# Patient Record
Sex: Female | Born: 1935 | Race: White | Hispanic: No | Smoking: Never smoker
Health system: Southern US, Community
[De-identification: ages and names within clinical notes are randomized; demographics above are authoritative.]

## PROBLEM LIST (undated history)

## (undated) DIAGNOSIS — F0390 Unspecified dementia without behavioral disturbance: Secondary | ICD-10-CM

## (undated) DIAGNOSIS — M51369 Other intervertebral disc degeneration, lumbar region without mention of lumbar back pain or lower extremity pain: Secondary | ICD-10-CM

## (undated) DIAGNOSIS — M4800 Spinal stenosis, site unspecified: Secondary | ICD-10-CM

## (undated) DIAGNOSIS — S72009A Fracture of unspecified part of neck of unspecified femur, initial encounter for closed fracture: Secondary | ICD-10-CM

## (undated) DIAGNOSIS — M5136 Other intervertebral disc degeneration, lumbar region: Secondary | ICD-10-CM

## (undated) DIAGNOSIS — I10 Essential (primary) hypertension: Secondary | ICD-10-CM

## (undated) DIAGNOSIS — M069 Rheumatoid arthritis, unspecified: Secondary | ICD-10-CM

## (undated) DIAGNOSIS — M549 Dorsalgia, unspecified: Secondary | ICD-10-CM

## (undated) DIAGNOSIS — G8929 Other chronic pain: Secondary | ICD-10-CM

## (undated) HISTORY — PX: OTHER SURGICAL HISTORY: SHX169

## (undated) HISTORY — PX: CATARACT EXTRACTION, BILATERAL: SHX1313

## (undated) HISTORY — PX: HEMORRHOID SURGERY: SHX153

## (undated) HISTORY — PX: CHOLECYSTECTOMY: SHX55

---

## 2005-07-29 ENCOUNTER — Other Ambulatory Visit: Payer: Self-pay

## 2005-07-30 ENCOUNTER — Inpatient Hospital Stay: Payer: Self-pay | Admitting: Internal Medicine

## 2005-08-03 ENCOUNTER — Other Ambulatory Visit: Payer: Self-pay

## 2005-09-04 ENCOUNTER — Inpatient Hospital Stay: Payer: Self-pay | Admitting: Unknown Physician Specialty

## 2005-09-05 ENCOUNTER — Other Ambulatory Visit: Payer: Self-pay

## 2005-09-10 ENCOUNTER — Encounter: Payer: Self-pay | Admitting: Internal Medicine

## 2005-09-11 ENCOUNTER — Encounter: Payer: Self-pay | Admitting: Internal Medicine

## 2005-10-12 ENCOUNTER — Encounter: Payer: Self-pay | Admitting: Internal Medicine

## 2006-12-31 ENCOUNTER — Other Ambulatory Visit: Payer: Self-pay

## 2006-12-31 ENCOUNTER — Ambulatory Visit: Payer: Self-pay | Admitting: Ophthalmology

## 2007-01-07 ENCOUNTER — Ambulatory Visit: Payer: Self-pay | Admitting: Ophthalmology

## 2007-04-28 ENCOUNTER — Ambulatory Visit: Payer: Self-pay | Admitting: Ophthalmology

## 2013-09-25 DIAGNOSIS — M5136 Other intervertebral disc degeneration, lumbar region: Secondary | ICD-10-CM | POA: Insufficient documentation

## 2013-09-25 DIAGNOSIS — M51369 Other intervertebral disc degeneration, lumbar region without mention of lumbar back pain or lower extremity pain: Secondary | ICD-10-CM | POA: Insufficient documentation

## 2013-09-25 DIAGNOSIS — M47816 Spondylosis without myelopathy or radiculopathy, lumbar region: Secondary | ICD-10-CM | POA: Insufficient documentation

## 2013-09-25 DIAGNOSIS — M545 Low back pain, unspecified: Secondary | ICD-10-CM | POA: Insufficient documentation

## 2013-09-25 DIAGNOSIS — M5441 Lumbago with sciatica, right side: Secondary | ICD-10-CM

## 2013-09-25 DIAGNOSIS — M5416 Radiculopathy, lumbar region: Secondary | ICD-10-CM | POA: Insufficient documentation

## 2013-09-25 DIAGNOSIS — M5116 Intervertebral disc disorders with radiculopathy, lumbar region: Secondary | ICD-10-CM | POA: Insufficient documentation

## 2013-09-25 DIAGNOSIS — G8929 Other chronic pain: Secondary | ICD-10-CM | POA: Insufficient documentation

## 2013-12-21 ENCOUNTER — Ambulatory Visit: Payer: Self-pay | Admitting: Orthopedic Surgery

## 2014-01-03 ENCOUNTER — Emergency Department: Payer: Self-pay | Admitting: Emergency Medicine

## 2014-01-03 LAB — URINALYSIS, COMPLETE
BILIRUBIN, UR: NEGATIVE
GLUCOSE, UR: NEGATIVE mg/dL (ref 0–75)
Ketone: NEGATIVE
NITRITE: POSITIVE
PH: 6 (ref 4.5–8.0)
Protein: NEGATIVE
Specific Gravity: 1.009 (ref 1.003–1.030)
Squamous Epithelial: <1
WBC UR: 69 /HPF (ref 0–5)

## 2014-01-03 LAB — BASIC METABOLIC PANEL
ANION GAP: 12 (ref 7–16)
BUN: 25 mg/dL — ABNORMAL HIGH (ref 7–18)
CALCIUM: 8.3 mg/dL — AB (ref 8.5–10.1)
Chloride: 105 mmol/L (ref 98–107)
Co2: 22 mmol/L (ref 21–32)
Creatinine: 1.37 mg/dL — ABNORMAL HIGH (ref 0.60–1.30)
EGFR (Non-African Amer.): 36 — ABNORMAL LOW
GFR CALC AF AMER: 41 — AB
GLUCOSE: 140 mg/dL — AB (ref 65–99)
Osmolality: 284 (ref 275–301)
Potassium: 3.8 mmol/L (ref 3.5–5.1)
Sodium: 139 mmol/L (ref 136–145)

## 2014-01-03 LAB — CBC
HCT: 35.2 % (ref 35.0–47.0)
HGB: 12.1 g/dL (ref 12.0–16.0)
MCH: 31.9 pg (ref 26.0–34.0)
MCHC: 34.4 g/dL (ref 32.0–36.0)
MCV: 93 fL (ref 80–100)
Platelet: 157 10*3/uL (ref 150–440)
RBC: 3.8 10*6/uL (ref 3.80–5.20)
RDW: 12.9 % (ref 11.5–14.5)
WBC: 10 10*3/uL (ref 3.6–11.0)

## 2014-01-05 LAB — URINE CULTURE

## 2015-04-04 ENCOUNTER — Emergency Department: Payer: Medicare Other

## 2015-04-04 ENCOUNTER — Inpatient Hospital Stay
Admission: EM | Admit: 2015-04-04 | Discharge: 2015-04-08 | DRG: 470 | Disposition: A | Discharge status: Skilled Nursing Facility | Payer: Medicare Other | Attending: Internal Medicine | Admitting: Internal Medicine

## 2015-04-04 DIAGNOSIS — Y939 Activity, unspecified: Secondary | ICD-10-CM

## 2015-04-04 DIAGNOSIS — Z803 Family history of malignant neoplasm of breast: Secondary | ICD-10-CM

## 2015-04-04 DIAGNOSIS — M4806 Spinal stenosis, lumbar region: Secondary | ICD-10-CM | POA: Diagnosis present

## 2015-04-04 DIAGNOSIS — D649 Anemia, unspecified: Secondary | ICD-10-CM | POA: Diagnosis present

## 2015-04-04 DIAGNOSIS — I10 Essential (primary) hypertension: Secondary | ICD-10-CM | POA: Diagnosis present

## 2015-04-04 DIAGNOSIS — Y92000 Kitchen of unspecified non-institutional (private) residence as  the place of occurrence of the external cause: Secondary | ICD-10-CM | POA: Diagnosis not present

## 2015-04-04 DIAGNOSIS — S72009A Fracture of unspecified part of neck of unspecified femur, initial encounter for closed fracture: Secondary | ICD-10-CM | POA: Diagnosis present

## 2015-04-04 DIAGNOSIS — M549 Dorsalgia, unspecified: Secondary | ICD-10-CM | POA: Diagnosis present

## 2015-04-04 DIAGNOSIS — K219 Gastro-esophageal reflux disease without esophagitis: Secondary | ICD-10-CM | POA: Diagnosis present

## 2015-04-04 DIAGNOSIS — W010XXA Fall on same level from slipping, tripping and stumbling without subsequent striking against object, initial encounter: Secondary | ICD-10-CM | POA: Diagnosis present

## 2015-04-04 DIAGNOSIS — N390 Urinary tract infection, site not specified: Secondary | ICD-10-CM

## 2015-04-04 DIAGNOSIS — S72002A Fracture of unspecified part of neck of left femur, initial encounter for closed fracture: Principal | ICD-10-CM | POA: Diagnosis present

## 2015-04-04 DIAGNOSIS — K5900 Constipation, unspecified: Secondary | ICD-10-CM | POA: Diagnosis present

## 2015-04-04 DIAGNOSIS — E876 Hypokalemia: Secondary | ICD-10-CM | POA: Diagnosis present

## 2015-04-04 DIAGNOSIS — J9811 Atelectasis: Secondary | ICD-10-CM | POA: Diagnosis present

## 2015-04-04 DIAGNOSIS — G8929 Other chronic pain: Secondary | ICD-10-CM | POA: Diagnosis present

## 2015-04-04 DIAGNOSIS — Z96649 Presence of unspecified artificial hip joint: Secondary | ICD-10-CM

## 2015-04-04 DIAGNOSIS — M069 Rheumatoid arthritis, unspecified: Secondary | ICD-10-CM | POA: Diagnosis present

## 2015-04-04 HISTORY — DX: Rheumatoid arthritis, unspecified: M06.9

## 2015-04-04 HISTORY — DX: Essential (primary) hypertension: I10

## 2015-04-04 HISTORY — DX: Other chronic pain: G89.29

## 2015-04-04 HISTORY — DX: Dorsalgia, unspecified: M54.9

## 2015-04-04 HISTORY — DX: Fracture of unspecified part of neck of unspecified femur, initial encounter for closed fracture: S72.009A

## 2015-04-04 HISTORY — DX: Spinal stenosis, site unspecified: M48.00

## 2015-04-04 LAB — CBC
HCT: 35.4 % (ref 35.0–47.0)
HEMOGLOBIN: 11.5 g/dL — AB (ref 12.0–16.0)
MCH: 28.4 pg (ref 26.0–34.0)
MCHC: 32.5 g/dL (ref 32.0–36.0)
MCV: 87.3 fL (ref 80.0–100.0)
PLATELETS: 209 10*3/uL (ref 150–440)
RBC: 4.06 MIL/uL (ref 3.80–5.20)
RDW: 14.2 % (ref 11.5–14.5)
WBC: 6.7 10*3/uL (ref 3.6–11.0)

## 2015-04-04 LAB — URINALYSIS COMPLETE WITH MICROSCOPIC (ARMC ONLY)
BILIRUBIN URINE: NEGATIVE
Bacteria, UA: NONE SEEN
GLUCOSE, UA: NEGATIVE mg/dL
KETONES UR: NEGATIVE mg/dL
Leukocytes, UA: NEGATIVE
NITRITE: NEGATIVE
Protein, ur: NEGATIVE mg/dL
SPECIFIC GRAVITY, URINE: 1.01 (ref 1.005–1.030)
Squamous Epithelial / LPF: NONE SEEN
pH: 7 (ref 5.0–8.0)

## 2015-04-04 LAB — APTT: APTT: 30 s (ref 24–36)

## 2015-04-04 LAB — COMPREHENSIVE METABOLIC PANEL
ALBUMIN: 3.7 g/dL (ref 3.5–5.0)
ALT: 15 U/L (ref 14–54)
AST: 27 U/L (ref 15–41)
Alkaline Phosphatase: 57 U/L (ref 38–126)
Anion gap: 8 (ref 5–15)
BUN: 17 mg/dL (ref 6–20)
CHLORIDE: 102 mmol/L (ref 101–111)
CO2: 24 mmol/L (ref 22–32)
CREATININE: 0.83 mg/dL (ref 0.44–1.00)
Calcium: 8.9 mg/dL (ref 8.9–10.3)
GFR calc non Af Amer: >60 mL/min (ref >60)
GLUCOSE: 96 mg/dL (ref 65–99)
Potassium: 3.8 mmol/L (ref 3.5–5.1)
SODIUM: 134 mmol/L — AB (ref 135–145)
Total Bilirubin: 0.4 mg/dL (ref 0.3–1.2)
Total Protein: 6.8 g/dL (ref 6.5–8.1)

## 2015-04-04 LAB — TYPE AND SCREEN
ABO/RH(D): O POS
Antibody Screen: NEGATIVE

## 2015-04-04 LAB — MRSA PCR SCREENING: MRSA BY PCR: NEGATIVE

## 2015-04-04 LAB — ABO/RH: ABO/RH(D): O POS

## 2015-04-04 LAB — PROTIME-INR
INR: 0.94
Prothrombin Time: 12.8 seconds (ref 11.4–15.0)

## 2015-04-04 MED ORDER — ONDANSETRON HCL 4 MG/2ML IJ SOLN: 4.0000 mg | Freq: Four times a day (QID) | INTRAMUSCULAR | Status: DC | PRN

## 2015-04-04 MED ORDER — SODIUM CHLORIDE 0.9 % IV SOLN
INTRAVENOUS | Status: DC
  Administered 2015-04-04: 20:00:00 via INTRAVENOUS

## 2015-04-04 MED ORDER — MORPHINE SULFATE (PF) 2 MG/ML IV SOLN
2.0000 mg | INTRAVENOUS | Status: DC | PRN
  Administered 2015-04-04: 2 mg via INTRAVENOUS
  Administered 2015-04-05: 4 mg via INTRAVENOUS
  Administered 2015-04-05: 2 mg via INTRAVENOUS
  Administered 2015-04-05: 4 mg via INTRAVENOUS
  Administered 2015-04-05: 2 mg via INTRAVENOUS
  Filled 2015-04-04 (×3): qty 1
  Filled 2015-04-04 (×2): qty 2

## 2015-04-04 MED ORDER — OXYCODONE HCL 5 MG PO TABS
5.0000 mg | ORAL_TABLET | ORAL | Status: DC | PRN
Start: 2015-04-04 — End: 2015-04-05
  Administered 2015-04-04: 5 mg via ORAL
  Filled 2015-04-04: qty 1

## 2015-04-04 MED ORDER — ONDANSETRON HCL 4 MG PO TABS: 4.0000 mg | ORAL_TABLET | Freq: Four times a day (QID) | ORAL | Status: DC | PRN

## 2015-04-04 MED ORDER — SENNOSIDES-DOCUSATE SODIUM 8.6-50 MG PO TABS
1.0000 | ORAL_TABLET | Freq: Two times a day (BID) | ORAL | Status: DC
  Filled 2015-04-04: qty 1

## 2015-04-04 MED ORDER — DOCUSATE SODIUM 100 MG PO CAPS
100.0000 mg | ORAL_CAPSULE | Freq: Two times a day (BID) | ORAL | Status: DC
  Filled 2015-04-04: qty 1

## 2015-04-04 MED ORDER — HYDROMORPHONE HCL 1 MG/ML IJ SOLN
INTRAMUSCULAR | Status: AC
  Administered 2015-04-04: 1 mg
  Filled 2015-04-04: qty 1

## 2015-04-04 MED ORDER — HYDRALAZINE HCL 20 MG/ML IJ SOLN: 10.0000 mg | Freq: Four times a day (QID) | INTRAMUSCULAR | Status: DC | PRN

## 2015-04-04 MED ORDER — PANTOPRAZOLE SODIUM 40 MG PO TBEC
40.0000 mg | DELAYED_RELEASE_TABLET | Freq: Two times a day (BID) | ORAL | Status: DC
  Administered 2015-04-04: 40 mg via ORAL
  Filled 2015-04-04: qty 1

## 2015-04-04 MED ORDER — BISACODYL 10 MG RE SUPP: 10.0000 mg | Freq: Every day | RECTAL | Status: DC | PRN

## 2015-04-04 MED ORDER — CEFAZOLIN SODIUM-DEXTROSE 2-3 GM-% IV SOLR
2.0000 g | INTRAVENOUS | Status: DC
  Filled 2015-04-04: qty 50

## 2015-04-04 MED ORDER — MORPHINE SULFATE (PF) 2 MG/ML IV SOLN: 2.0000 mg | INTRAVENOUS | Status: DC | PRN

## 2015-04-04 MED ORDER — POLYETHYLENE GLYCOL 3350 17 G PO PACK: 17.0000 g | PACK | Freq: Every day | ORAL | Status: DC | PRN

## 2015-04-04 MED ORDER — OXYCODONE HCL 5 MG PO TABS: 5.0000 mg | ORAL_TABLET | ORAL | Status: DC | PRN

## 2015-04-04 MED ORDER — LISINOPRIL 20 MG PO TABS
20.0000 mg | ORAL_TABLET | Freq: Every day | ORAL | Status: DC
  Administered 2015-04-06 – 2015-04-08 (×3): 20 mg via ORAL
  Filled 2015-04-04 (×3): qty 1

## 2015-04-04 NOTE — H&P (Signed)
[facility] [place] at [facility]   PATIENT NAME: Angel Simmons    MR#:  [id number]  DATE OF BIRTH:  04/05/1929  DATE OF ADMISSION:  04/04/2015  PRIMARY CARE PHYSICIAN: Dr. Corky Downs  REQUESTING/REFERRING PHYSICIAN: Dr. Darnelle Catalan  CHIEF COMPLAINT:   Chief Complaint  Patient presents with  . Fall  . Hip Pain    HISTORY OF PRESENT ILLNESS:  Angel Simmons  is a 81 y.o. female with a known history of hypertension, rheumatoid arthritis and chronic back pain with neurogenic claudication secondary to spinal stenosis presents to the hospital after she had a fall and x-rays showing right femoral neck fracture. Patient denies any cardiac symptoms, no cardiac history. She was walking into her kitchen today and suddenly turned around to lift her phone and had a fall. Denies any lightheadedness, dizziness or chest pain prior or after the fall. Did not lose her consciousness, did not take her head. She twisted her leg and fell onto her left side. Chest x-ray showing only bibasilar atelectasis. EKG and labs are pending at this time. Patient states her pain is only to 2/10 when she is not moving in bed. Does not appear to be in distress.  PAST MEDICAL HISTORY:   Past Medical History  Diagnosis Date  . Hip fx (HCC)   . Hypertension   . Chronic back pain   . Rheumatoid arthritis (HCC)   . Spinal stenosis     PAST SURGICAL HISTORY:   Past Surgical History  Procedure Laterality Date  . Percutaneous pinning of a right femoral neck fracture    . Cholecystectomy    . Hemorrhoid surgery    . Cataract extraction, bilateral      SOCIAL HISTORY:   Social History  Substance Use Topics  . Smoking status: Never Smoker   . Smokeless tobacco: Not on file  . Alcohol Use: No    FAMILY HISTORY:   Family History  Problem Relation Age of Onset  . Breast cancer Mother   . Dementia Father     DRUG ALLERGIES:   Allergies  Allergen Reactions  . Tylenol  [Acetaminophen] Other (See Comments)    Reaction:  Headaches     REVIEW OF SYSTEMS:   Review of Systems  Constitutional: Negative for fever, chills, weight loss and malaise/fatigue.  HENT: Positive for hearing loss. Negative for ear discharge, ear pain, nosebleeds and tinnitus.   Eyes: Negative for double vision and photophobia.  Respiratory: Negative for cough, hemoptysis, shortness of breath and wheezing.   Cardiovascular: Negative for chest pain, palpitations, orthopnea and leg swelling.  Gastrointestinal: Negative for heartburn, nausea, vomiting, abdominal pain, diarrhea, constipation and melena.  Genitourinary: Negative for dysuria, urgency, frequency and hematuria.  Musculoskeletal: Positive for back pain and joint pain. Negative for myalgias and neck pain.       Left hip pain  Skin: Negative for rash.  Neurological: Negative for dizziness, tingling, tremors, sensory change, speech change, focal weakness and headaches.  Endo/Heme/Allergies: Does not bruise/bleed easily.  Psychiatric/Behavioral: Negative for depression.    MEDICATIONS AT HOME:   Prior to Admission medications   Not on File      VITAL SIGNS:  Blood pressure 177/87, pulse 67, temperature 97.5 F (36.4 C), temperature source Oral, resp. rate 12, height 5\' 4"  (1.626 m), weight 53.071 kg (117 lb), SpO2 97 %.  PHYSICAL EXAMINATION:   Physical Exam  GENERAL:  81 y.o.-year-old patient lying in the bed with no acute distress.  EYES: Pupils  equal, round, reactive to light and accommodation. No scleral icterus. Extraocular muscles intact.  HEENT: Head atraumatic, normocephalic. Oropharynx and nasopharynx clear.  NECK:  Supple, no jugular venous distention. No thyroid enlargement, no tenderness.  LUNGS: Normal breath sounds bilaterally, no wheezing, rales,rhonchi or crepitation. No use of accessory muscles of respiration. Decreased bibasilar breath sounds.  CARDIOVASCULAR: S1, S2 normal. No rubs, or gallops. 3/6  systolic murmur in mitral area ABDOMEN: Soft, nontender, nondistended. Bowel sounds present. No organomegaly or mass.  EXTREMITIES: No pedal edema, cyanosis, or clubbing. Left leg is abducted and externally rotated NEUROLOGIC: Cranial nerves II through XII are intact. Muscle strength 5/5 in all extremities except left leg as movement is limited secondary to pain. Sensation intact. Gait not checked.  PSYCHIATRIC: The patient is alert and oriented x 3. Patient is slightly hard of hearing. SKIN: No obvious rash, lesion, or ulcer.   LABORATORY PANEL:   CBC No results for input(s): WBC, HGB, HCT, PLT in the last 168 hours. ------------------------------------------------------------------------------------------------------------------  Chemistries  No results for input(s): NA, K, CL, CO2, GLUCOSE, BUN, CREATININE, CALCIUM, MG, AST, ALT, ALKPHOS, BILITOT in the last 168 hours.  Invalid input(s): GFRCGP ------------------------------------------------------------------------------------------------------------------  Cardiac Enzymes No results for input(s): TROPONINI in the last 168 hours. ------------------------------------------------------------------------------------------------------------------  RADIOLOGY:  Dg Chest 1 View  04/04/2015  CLINICAL DATA:  Fall today.  Left side hip pain, hip fracture. EXAM: CHEST 1 VIEW COMPARISON:  09/05/2005 FINDINGS: Mitral valve annular calcifications. Slight elevation of the left hemidiaphragm. Heart is normal size. Heart is borderline in size. No confluent airspace opacities or effusions. No acute bony abnormality. IMPRESSION: No active disease. Electronically Signed   By: Charlett Nose M.D.   On: 04/04/2015 16:51   Dg Hip Unilat With Pelvis 2-3 Views Left  04/04/2015  CLINICAL DATA:  Fall in kitchen today.  Left hip pain. EXAM: DG HIP (WITH OR WITHOUT PELVIS) 2-3V LEFT COMPARISON:  None. FINDINGS: There is a left femoral neck fracture with varus  angulation. No subluxation or dislocation. 3 screws noted within the right proximal femur. IMPRESSION: Left femoral neck fracture with varus angulation. Electronically Signed   By: Charlett Nose M.D.   On: 04/04/2015 16:48    EKG:   Orders placed or performed in visit on 12/31/06  . EKG 12-Lead    IMPRESSION AND PLAN:   Angel Simmons  is a 81 y.o. female with a known history of hypertension, rheumatoid arthritis and chronic back pain with neurogenic claudication secondary to spinal stenosis presents to the hospital after she had a fall and x-rays showing right femoral neck fracture.  #1 left hip fracture-fall and left femoral neck fracture. -Orthopedics consult. Labs are still pending. Labs 2 months ago were normal. If they turn out to be normal, patient is a low risk for surgery. Can proceed with surgery. -Pain control. Postoperative DVT prophylaxis and physical therapy consults. -Regimen for constipation postoperatively. -social worker consult for possible rehabilitation placement  #2 hypertension-continue lisinopril. -Added hydralazine when necessary. Blood pressure elevated secondary to pain.  #3 rheumatoid arthritis and spinal stenosis-doing physical therapy and also hold therapy. Follows with [facility] surgery.  #4 DVT prophylaxis-none for now due to patient likely to have surgery tomorrow.    All the records are reviewed and case discussed with ED provider. Management plans discussed with the patient, family and they are in agreement.  CODE STATUS: Full Code  TOTAL TIME TAKING CARE OF THIS PATIENT: 50 minutes.    Enid Baas M.D on 04/04/2015 at 5:26  PM  Between 7am to 6pm - Pager - [phone]  After 6pm go to [email or web address] - password [email or web address] [facility]  [place]  Office  [phone]  CC: Primary care physician; No primary care provider on file.

## 2015-04-04 NOTE — ED Notes (Signed)
Pt given boxed meal tray to eat.

## 2015-04-04 NOTE — ED Provider Notes (Signed)
[facility] Emergency Department Provider Note  ____________________________________________  Time seen: Approximately 4:29 PM  I have reviewed the triage vital signs and the nursing notes.   HISTORY  Chief Complaint Fall and Hip Pain    HPI Angel Simmons is a 81 y.o. female who reports she was in her kitchen turned around and lost her balance and fell. She landed on her left hip. She complains of a lot of pain in the left hip. Feels like when she broke her right hip. Pain is worse with movement. Patient did not hit her head and did not lose consciousness. She has no other pain except for her left hip. No numbness or weakness in the leg.    Past Medical History  Diagnosis Date  . Hip fx (HCC)   . Hypertension   . Chronic back pain   . Rheumatoid arthritis (HCC)   . Spinal stenosis     Patient Active Problem List   Diagnosis Date Noted  . Hip fracture (HCC) 04/04/2015    Past Surgical History  Procedure Laterality Date  . Percutaneous pinning of a right femoral neck fracture    . Cholecystectomy    . Hemorrhoid surgery    . Cataract extraction, bilateral      No current outpatient prescriptions on file.  Allergies Tylenol  Family History  Problem Relation Age of Onset  . Breast cancer Mother   . Dementia Father     Social History Social History  Substance Use Topics  . Smoking status: Never Smoker   . Smokeless tobacco: None  . Alcohol Use: No    Review of Systems Constitutional: No fever/chills Eyes: No visual changes. ENT: No sore throat. Cardiovascular: Denies chest pain. Respiratory: Denies shortness of breath. Gastrointestinal: No abdominal pain.  No nausea, no vomiting.  No diarrhea.  No constipation. Genitourinary: Negative for dysuria. Musculoskeletal: Negative for back pain. Skin: Negative for rash. Neurological: Negative for headaches, focal weakness or numbness.  10-point ROS otherwise  negative.  ____________________________________________   PHYSICAL EXAM:  VITAL SIGNS: ED Triage Vitals  Enc Vitals Group     BP 04/04/15 1553 216/100 mmHg     Pulse Rate 04/04/15 1553 69     Resp 04/04/15 1553 18     Temp 04/04/15 1553 97.5 F (36.4 C)     Temp Source 04/04/15 1553 Oral     SpO2 04/04/15 1553 98 %     Weight 04/04/15 1553 117 lb (53.071 kg)     Height 04/04/15 1553 5\' 4"  (1.626 m)     Head Cir --      Peak Flow --      Pain Score 04/04/15 1554 10     Pain Loc --      Pain Edu? --      Excl. in GC? --     Constitutional: Alert and oriented. Well appearing and in no acute distress unless she moves in which case she is in acute distress. Eyes: Conjunctivae are normal. PERRL. EOMI. Head: Atraumatic. Nose: No congestion/rhinnorhea. Mouth/Throat: Mucous membranes are moist.  Oropharynx non-erythematous. Neck: No stridor.No cervical spine tenderness to palpation. Cardiovascular: Normal rate, regular rhythm. Grossly normal heart sounds.  Good peripheral circulation. Respiratory: Normal respiratory effort.  No retractions. Lungs CTAB. Gastrointestinal: Soft and nontender. No distention. No abdominal bruits. No CVA tenderness. Musculoskeletal: No lower extremity tenderness except for in the left hip. There is no edema edema.  No joint effusions. Neurologic:  Normal speech and language.  No gross focal neurologic deficits are appreciated. Specifically pulses capillary refill and sensation normal in the left foot Psychiatric: Mood and affect are normal. Speech and behavior are normal.  ____________________________________________   LABS (all labs ordered are listed, but only abnormal results are displayed)  Labs Reviewed  CBC - Abnormal; Notable for the following:    Hemoglobin 11.5 (*)    All other components within normal limits  COMPREHENSIVE METABOLIC PANEL - Abnormal; Notable for the following:    Sodium 134 (*)    All other components within normal limits   URINALYSIS COMPLETEWITH MICROSCOPIC ([facility] ONLY) - Abnormal; Notable for the following:    Color, Urine YELLOW (*)    APPearance CLEAR (*)    Hgb urine dipstick 1+ (*)    All other components within normal limits  MRSA PCR SCREENING  URINE CULTURE  PROTIME-INR  APTT  VITAMIN D 25 HYDROXY (VIT D DEFICIENCY, FRACTURES)  BASIC METABOLIC PANEL  CBC  TYPE AND SCREEN  ABO/RH   ____________________________________________  EKG  Still pending ____________________________________________  RADIOLOGY  Hip fracture ____________________________________________   PROCEDURES    ____________________________________________   INITIAL IMPRESSION / ASSESSMENT AND PLAN / ED COURSE  Pertinent labs & imaging results that were available during my care of the patient were reviewed by me and considered in my medical decision making (see chart for details).   ____________________________________________   FINAL CLINICAL IMPRESSION(S) / ED DIAGNOSES  Final diagnoses:  Hip fracture, left, closed, initial encounter [facility])  Urinary tract infection without hematuria, site unspecified      Arnaldo Natal, MD 04/04/15 2248

## 2015-04-04 NOTE — ED Notes (Signed)
Patient transported to room 141.

## 2015-04-04 NOTE — ED Notes (Signed)
Pt comes into the ED via EMS from home states she tripped and fell in her kitchen and is having left hip pain.Marland Kitchen

## 2015-04-04 NOTE — Consult Note (Signed)
ORTHOPAEDIC CONSULTATION  PATIENT NAME: Angel Simmons DOB: 1929-03-10  MRN: [id number]  REQUESTING PHYSICIAN: Enid Baas, MD  Chief Complaint: Left hip pain  HPI: Angel Simmons is a 81 y.o. female who complains of  left hip pain. She apparently turned quickly at home and fell, landing on her left hip and side. She was unable to stand or bear weight due to the left hip pain. She denied any loss of consciousness. She denied any other injuries. Prior to the fall she was using ambulatory aids, either a cane or walker.  Of note, the patient is actively being treated for lumbar spinal stenosis with neurogenic claudication. She has received multiple dural steroid injections with reasonably good relief. She is also involved in physical therapy.  Past Medical History  Diagnosis Date  . Hip fx (HCC)   . Hypertension   . Chronic back pain   . Rheumatoid arthritis (HCC)   . Spinal stenosis    Past Surgical History  Procedure Laterality Date  . Percutaneous pinning of a right femoral neck fracture    . Cholecystectomy    . Hemorrhoid surgery    . Cataract extraction, bilateral     Social History   Social History  . Marital Status: Married    Spouse Name: N/A  . Number of Children: N/A  . Years of Education: N/A   Social History Main Topics  . Smoking status: Never Smoker   . Smokeless tobacco: None  . Alcohol Use: No  . Drug Use: No  . Sexual Activity: Not Asked   Other Topics Concern  . None   Social History Narrative   Lives independently at home. Ambulates with the help of a cane, occasionally uses a walker.   Family History  Problem Relation Age of Onset  . Breast cancer Mother   . Dementia Father    Allergies  Allergen Reactions  . Tylenol [Acetaminophen] Other (See Comments)    Reaction:  Headaches    Prior to Admission medications   Not on File   Dg Chest 1 View  04/04/2015  CLINICAL DATA:  Fall today.  Left side hip pain, hip fracture. EXAM: CHEST 1  VIEW COMPARISON:  09/05/2005 FINDINGS: Mitral valve annular calcifications. Slight elevation of the left hemidiaphragm. Heart is normal size. Heart is borderline in size. No confluent airspace opacities or effusions. No acute bony abnormality. IMPRESSION: No active disease. Electronically Signed   By: Charlett Nose M.D.   On: 04/04/2015 16:51   Dg Hip Unilat With Pelvis 2-3 Views Left  04/04/2015  CLINICAL DATA:  Fall in kitchen today.  Left hip pain. EXAM: DG HIP (WITH OR WITHOUT PELVIS) 2-3V LEFT COMPARISON:  None. FINDINGS: There is a left femoral neck fracture with varus angulation. No subluxation or dislocation. 3 screws noted within the right proximal femur. IMPRESSION: Left femoral neck fracture with varus angulation. Electronically Signed   By: Charlett Nose M.D.   On: 04/04/2015 16:48    Positive ROS: All other systems have been reviewed and were otherwise negative with the exception of those mentioned in the HPI and as above.  Physical Exam: General: Alert and alert in no acute distress. HEENT: Atraumatic and normocephalic. Sclera are clear. Extraocular motion is intact. Oropharynx is clear with moist mucosa. Neck: Supple, nontender, good range of motion. No JVD or carotid bruits. Lungs: Clear to auscultation bilaterally. Cardiovascular: Regular rate and rhythm with normal S1 and S2. No murmurs. No gallops or rubs. Pedal pulses are palpable  bilaterally. Homans test is negative bilaterally. No significant pretibial or ankle edema. Abdomen: Soft, nontender, and nondistended. Bowel sounds are present. Skin: No lesions in the area of chief complaint. No gross ecchymosis to the hip or thigh. Neurologic: Awake, alert, and oriented. Sensory function is grossly intact. Motor strength is felt to be 5 over 5 bilaterally. No clonus or tremor. Good motor coordination. Left lower extremity strength was not assessed due to the hip injury. Lymphatic: No axillary or cervical  lymphadenopathy  MUSCULOSKELETAL: Examination of the upper extremities demonstrates good range of motion and stability of the shoulders, elbows, wrists, and digits. There is relative enlargement of the IP joints of both hands consistent with degenerative arthrosis. Examination of the lower extremities is notable for findings involving the left lower extremity. The left lower extremity is shortened and rotated. Pain is elicited with any attempted range of motion of the hip. No knee effusion. No tenderness to palpation about the knee or ankle.  Assessment: Left femoral neck fracture, displaced  Plan: I recommend left hip hemiarthroplasty for the femoral neck fracture. The findings were discussed in detail with the patient. The usual perioperative course was discussed. The risks and benefits of surgical intervention were reviewed. The patient expressed understanding of the risks and benefits and agreed with plans for surgical intervention.  The surgical site was signed as per the "right site surgery" protocol.   Saquoia Sianez P. Angie Fava M.D.

## 2015-04-05 ENCOUNTER — Inpatient Hospital Stay: Payer: Medicare Other | Admitting: Anesthesiology

## 2015-04-05 ENCOUNTER — Encounter
Admission: EM | Disposition: A | Discharge status: Skilled Nursing Facility | Payer: Self-pay | Source: Home / Self Care | Attending: Internal Medicine

## 2015-04-05 ENCOUNTER — Inpatient Hospital Stay: Payer: Medicare Other

## 2015-04-05 ENCOUNTER — Encounter: Payer: Self-pay | Admitting: Anesthesiology

## 2015-04-05 HISTORY — PX: HIP ARTHROPLASTY: SHX981

## 2015-04-05 LAB — BASIC METABOLIC PANEL
Anion gap: 5 (ref 5–15)
BUN: 15 mg/dL (ref 6–20)
CALCIUM: 8.7 mg/dL — AB (ref 8.9–10.3)
CHLORIDE: 104 mmol/L (ref 101–111)
CO2: 26 mmol/L (ref 22–32)
CREATININE: 0.78 mg/dL (ref 0.44–1.00)
GFR calc non Af Amer: >60 mL/min (ref >60)
Glucose, Bld: 101 mg/dL — ABNORMAL HIGH (ref 65–99)
Potassium: 4 mmol/L (ref 3.5–5.1)
SODIUM: 135 mmol/L (ref 135–145)

## 2015-04-05 LAB — CBC
HCT: 33.2 % — ABNORMAL LOW (ref 35.0–47.0)
Hemoglobin: 11 g/dL — ABNORMAL LOW (ref 12.0–16.0)
MCH: 28.9 pg (ref 26.0–34.0)
MCHC: 33.2 g/dL (ref 32.0–36.0)
MCV: 87 fL (ref 80.0–100.0)
PLATELETS: 189 10*3/uL (ref 150–440)
RBC: 3.82 MIL/uL (ref 3.80–5.20)
RDW: 14.4 % (ref 11.5–14.5)
WBC: 7.6 10*3/uL (ref 3.6–11.0)

## 2015-04-05 LAB — VITAMIN D 25 HYDROXY (VIT D DEFICIENCY, FRACTURES): VIT D 25 HYDROXY: 17.4 ng/mL — AB (ref 30.0–100.0)

## 2015-04-05 SURGERY — HEMIARTHROPLASTY, HIP, DIRECT ANTERIOR APPROACH, FOR FRACTURE
Anesthesia: Spinal | Site: Hip | Laterality: Left | Wound class: Clean

## 2015-04-05 MED ORDER — OXYCODONE HCL 5 MG PO TABS
5.0000 mg | ORAL_TABLET | ORAL | Status: DC | PRN
  Administered 2015-04-06 – 2015-04-08 (×7): 5 mg via ORAL
  Filled 2015-04-05 (×7): qty 1

## 2015-04-05 MED ORDER — METOCLOPRAMIDE HCL 10 MG PO TABS
10.0000 mg | ORAL_TABLET | Freq: Three times a day (TID) | ORAL | Status: AC
  Administered 2015-04-06 – 2015-04-07 (×8): 10 mg via ORAL
  Filled 2015-04-05 (×8): qty 1

## 2015-04-05 MED ORDER — PHENYLEPHRINE HCL 10 MG/ML IJ SOLN
INTRAMUSCULAR | Status: DC | PRN
  Administered 2015-04-05: 0.2 mg

## 2015-04-05 MED ORDER — CEFAZOLIN SODIUM-DEXTROSE 2-3 GM-% IV SOLR
2.0000 g | Freq: Four times a day (QID) | INTRAVENOUS | Status: AC
  Administered 2015-04-06 (×4): 2 g via INTRAVENOUS
  Filled 2015-04-05 (×4): qty 50

## 2015-04-05 MED ORDER — FLEET ENEMA 7-19 GM/118ML RE ENEM: 1.0000 | ENEMA | Freq: Once | RECTAL | Status: DC | PRN

## 2015-04-05 MED ORDER — MAGNESIUM HYDROXIDE 400 MG/5ML PO SUSP
30.0000 mL | Freq: Every day | ORAL | Status: DC | PRN
  Administered 2015-04-07 – 2015-04-08 (×2): 30 mL via ORAL
  Filled 2015-04-05 (×2): qty 30

## 2015-04-05 MED ORDER — PANTOPRAZOLE SODIUM 40 MG PO TBEC
40.0000 mg | DELAYED_RELEASE_TABLET | Freq: Two times a day (BID) | ORAL | Status: DC
  Administered 2015-04-06: 40 mg via ORAL
  Filled 2015-04-05: qty 1

## 2015-04-05 MED ORDER — PROPOFOL 500 MG/50ML IV EMUL
INTRAVENOUS | Status: DC | PRN
  Administered 2015-04-05: 21:00:00 via INTRAVENOUS
  Administered 2015-04-05: 25 ug/kg/min via INTRAVENOUS

## 2015-04-05 MED ORDER — LACTATED RINGERS IV SOLN
INTRAVENOUS | Status: DC | PRN
  Administered 2015-04-05 (×2): via INTRAVENOUS

## 2015-04-05 MED ORDER — SODIUM CHLORIDE 0.9 % IV SOLN
INTRAVENOUS | Status: DC
  Administered 2015-04-06: via INTRAVENOUS

## 2015-04-05 MED ORDER — MIDAZOLAM HCL 5 MG/5ML IJ SOLN
INTRAMUSCULAR | Status: DC | PRN
  Administered 2015-04-05 (×2): 0.5 mg via INTRAVENOUS

## 2015-04-05 MED ORDER — ONDANSETRON HCL 4 MG/2ML IJ SOLN
4.0000 mg | Freq: Once | INTRAMUSCULAR | Status: AC | PRN
  Administered 2015-04-05: 4 mg via INTRAVENOUS

## 2015-04-05 MED ORDER — ONDANSETRON HCL 4 MG/2ML IJ SOLN: 4.0000 mg | Freq: Four times a day (QID) | INTRAMUSCULAR | Status: DC | PRN

## 2015-04-05 MED ORDER — METOCLOPRAMIDE HCL 5 MG/ML IJ SOLN: 5.0000 mg | Freq: Three times a day (TID) | INTRAMUSCULAR | Status: DC | PRN

## 2015-04-05 MED ORDER — FENTANYL CITRATE (PF) 100 MCG/2ML IJ SOLN
25.0000 ug | INTRAMUSCULAR | Status: DC | PRN
  Administered 2015-04-05 (×4): 25 ug via INTRAVENOUS

## 2015-04-05 MED ORDER — FERROUS SULFATE 325 (65 FE) MG PO TABS
325.0000 mg | ORAL_TABLET | Freq: Two times a day (BID) | ORAL | Status: DC
  Administered 2015-04-06 – 2015-04-08 (×5): 325 mg via ORAL
  Filled 2015-04-05 (×5): qty 1

## 2015-04-05 MED ORDER — PHENOL 1.4 % MT LIQD
1.0000 | OROMUCOSAL | Status: DC | PRN
  Filled 2015-04-05: qty 177

## 2015-04-05 MED ORDER — TRAMADOL HCL 50 MG PO TABS: 50.0000 mg | ORAL_TABLET | ORAL | Status: DC | PRN

## 2015-04-05 MED ORDER — PHENYLEPHRINE HCL 10 MG/ML IJ SOLN
INTRAMUSCULAR | Status: DC | PRN
  Administered 2015-04-05 (×2): 100 ug via INTRAVENOUS
  Administered 2015-04-05 (×2): 80 ug via INTRAVENOUS

## 2015-04-05 MED ORDER — ENSURE ENLIVE PO LIQD
237.0000 mL | ORAL | Status: DC
  Administered 2015-04-06 – 2015-04-08 (×3): 237 mL via ORAL

## 2015-04-05 MED ORDER — NEOMYCIN-POLYMYXIN B GU 40-200000 IR SOLN
Status: DC | PRN
  Administered 2015-04-05: 16 mL

## 2015-04-05 MED ORDER — KETAMINE HCL 10 MG/ML IJ SOLN
INTRAMUSCULAR | Status: DC | PRN
  Administered 2015-04-05 (×2): 10 mg via INTRAVENOUS

## 2015-04-05 MED ORDER — MENTHOL 3 MG MT LOZG
1.0000 | LOZENGE | OROMUCOSAL | Status: DC | PRN
  Filled 2015-04-05: qty 9

## 2015-04-05 MED ORDER — SENNOSIDES-DOCUSATE SODIUM 8.6-50 MG PO TABS
1.0000 | ORAL_TABLET | Freq: Two times a day (BID) | ORAL | Status: DC
  Administered 2015-04-06 – 2015-04-08 (×5): 1 via ORAL
  Filled 2015-04-05 (×4): qty 1

## 2015-04-05 MED ORDER — MORPHINE SULFATE (PF) 2 MG/ML IV SOLN
2.0000 mg | INTRAVENOUS | Status: DC | PRN
  Administered 2015-04-06: 2 mg via INTRAVENOUS
  Filled 2015-04-05: qty 1

## 2015-04-05 MED ORDER — METOCLOPRAMIDE HCL 5 MG PO TABS: 5.0000 mg | ORAL_TABLET | Freq: Three times a day (TID) | ORAL | Status: DC | PRN

## 2015-04-05 MED ORDER — BISACODYL 10 MG RE SUPP
10.0000 mg | Freq: Every day | RECTAL | Status: DC | PRN
  Administered 2015-04-08: 10 mg via RECTAL
  Filled 2015-04-05: qty 1

## 2015-04-05 MED ORDER — CEFAZOLIN SODIUM-DEXTROSE 2-3 GM-% IV SOLR
INTRAVENOUS | Status: DC | PRN
  Administered 2015-04-05: 2 g via INTRAVENOUS

## 2015-04-05 MED ORDER — ENOXAPARIN SODIUM 30 MG/0.3ML ~~LOC~~ SOLN
30.0000 mg | SUBCUTANEOUS | Status: DC
  Administered 2015-04-06: 30 mg via SUBCUTANEOUS
  Filled 2015-04-05: qty 0.3

## 2015-04-05 MED ORDER — ONDANSETRON HCL 4 MG PO TABS: 4.0000 mg | ORAL_TABLET | Freq: Four times a day (QID) | ORAL | Status: DC | PRN

## 2015-04-05 SURGICAL SUPPLY — 55 items
BAG DECANTER FOR FLEXI CONT (MISCELLANEOUS) ×2 IMPLANT
BLADE SAW 1 (BLADE) ×2 IMPLANT
CANISTER SUCT 1200ML W/VALVE (MISCELLANEOUS) ×2 IMPLANT
CANISTER SUCT 3000ML (MISCELLANEOUS) ×4 IMPLANT
CAPT HIP HEMI 2 ×2 IMPLANT
CATH FOL LEG HOLDER (MISCELLANEOUS) ×2 IMPLANT
CEMENT HV SMART SET (Cement) ×4 IMPLANT
DRAPE INCISE IOBAN 66X60 STRL (DRAPES) ×2 IMPLANT
DRAPE SHEET LG 3/4 BI-LAMINATE (DRAPES) ×2 IMPLANT
DRAPE TABLE BACK 80X90 (DRAPES) ×2 IMPLANT
DRSG DERMACEA 8X12 NADH (GAUZE/BANDAGES/DRESSINGS) ×2 IMPLANT
DRSG OPSITE POSTOP 4X12 (GAUZE/BANDAGES/DRESSINGS) ×2 IMPLANT
DRSG OPSITE POSTOP 4X14 (GAUZE/BANDAGES/DRESSINGS) IMPLANT
DRSG TEGADERM 4X4.75 (GAUZE/BANDAGES/DRESSINGS) ×2 IMPLANT
DURAPREP 26ML APPLICATOR (WOUND CARE) ×4 IMPLANT
ELECT BLADE 6.5 EXT (BLADE) ×2 IMPLANT
ELECT CAUTERY BLADE 6.4 (BLADE) ×2 IMPLANT
GAUZE PACK 2X3YD (MISCELLANEOUS) ×2 IMPLANT
GLOVE BIO SURGEON STRL SZ8 (GLOVE) ×2 IMPLANT
GLOVE BIOGEL M STRL SZ7.5 (GLOVE) ×4 IMPLANT
GLOVE BIOGEL PI IND STRL 9 (GLOVE) IMPLANT
GLOVE BIOGEL PI INDICATOR 9 (GLOVE)
GLOVE INDICATOR 8.0 STRL GRN (GLOVE) ×2 IMPLANT
GOWN STRL REUS W/ TWL LRG LVL3 (GOWN DISPOSABLE) IMPLANT
GOWN STRL REUS W/TWL 2XL LVL3 (GOWN DISPOSABLE) IMPLANT
GOWN STRL REUS W/TWL LRG LVL3 (GOWN DISPOSABLE)
HANDPIECE SUCTION TUBG SURGILV (MISCELLANEOUS) ×2 IMPLANT
HEMOVAC 400CC 10FR (MISCELLANEOUS) ×2 IMPLANT
HOOD PEEL AWAY FLYTE STAYCOOL (MISCELLANEOUS) ×2 IMPLANT
IV NS 100ML SINGLE PACK (IV SOLUTION) ×2 IMPLANT
KIT RM TURNOVER STRD PROC AR (KITS) ×2 IMPLANT
NDL SAFETY 18GX1.5 (NEEDLE) ×2 IMPLANT
NEEDLE FILTER BLUNT 18X 1/2SAF (NEEDLE) ×1
NEEDLE FILTER BLUNT 18X1 1/2 (NEEDLE) ×1 IMPLANT
NS IRRIG 1000ML POUR BTL (IV SOLUTION) ×2 IMPLANT
PACK HIP PROSTHESIS (MISCELLANEOUS) ×2 IMPLANT
PAD GROUND ADULT SPLIT (MISCELLANEOUS) ×2 IMPLANT
PRESSURIZER CEMENT PROX FEM SM (MISCELLANEOUS) IMPLANT
PRESSURIZER FEM CANAL M (MISCELLANEOUS) ×2 IMPLANT
SOL .9 NS 3000ML IRR  AL (IV SOLUTION) ×1
SOL .9 NS 3000ML IRR UROMATIC (IV SOLUTION) ×1 IMPLANT
SOL PREP PVP 2OZ (MISCELLANEOUS) ×2
SOLUTION PREP PVP 2OZ (MISCELLANEOUS) ×1 IMPLANT
SPONGE DRAIN TRACH 4X4 STRL 2S (GAUZE/BANDAGES/DRESSINGS) ×2 IMPLANT
STAPLER SKIN PROX 35W (STAPLE) ×2 IMPLANT
SUT ETHIBOND #5 BRAIDED 30INL (SUTURE) ×2 IMPLANT
SUT VIC AB 0 CT1 36 (SUTURE) ×2 IMPLANT
SUT VIC AB 1 CT1 36 (SUTURE) ×8 IMPLANT
SUT VIC AB 2-0 CT1 27 (SUTURE) ×1
SUT VIC AB 2-0 CT1 TAPERPNT 27 (SUTURE) ×1 IMPLANT
SYR 20CC LL (SYRINGE) ×2 IMPLANT
SYR TB 1ML LUER SLIP (SYRINGE) ×2 IMPLANT
TAPE TRANSPORE STRL 2 31045 (GAUZE/BANDAGES/DRESSINGS) ×2 IMPLANT
TIP COAXIAL FEMORAL CANAL (MISCELLANEOUS) ×2 IMPLANT
TOWER CARTRIDGE SMART MIX (DISPOSABLE) ×2 IMPLANT

## 2015-04-05 NOTE — Brief Op Note (Signed)
04/04/2015 - 04/05/2015  9:57 PM  PATIENT:  Angel Simmons  81 y.o. female  PRE-OPERATIVE DIAGNOSIS:  Left femoral neck fracture  POST-OPERATIVE DIAGNOSIS:  Left femoral neck fracture  PROCEDURE: Left hip hemiarthroplasty  SURGEON:  Surgeon(s) and Role:    * Donato Heinz, MD - Primary  ASSISTANTS: none   ANESTHESIA:   spinal  EBL:  Total I/O In: 1200 [I.V.:1200] Out: 275 [Urine:200; Blood:75]  BLOOD ADMINISTERED:none  DRAINS: 2 medium drains to a Hemovac reservoir   LOCAL MEDICATIONS USED:  NONE  SPECIMEN:  Source of Specimen:  Left femoral head  DISPOSITION OF SPECIMEN:  PATHOLOGY  COUNTS:  YES  TOURNIQUET:  * No tourniquets in log *  DICTATION: .Dragon Dictation  PLAN OF CARE: Admit to inpatient   PATIENT DISPOSITION:  PACU - hemodynamically stable.   Delay start of Pharmacological VTE agent (>24hrs) due to surgical blood loss or risk of bleeding: yes

## 2015-04-05 NOTE — Anesthesia Preprocedure Evaluation (Addendum)
Anesthesia Evaluation  Patient identified by MRN, date of birth, ID band Patient awake    Reviewed: Allergy & Precautions, NPO status , Patient's Chart, lab work & pertinent test results  Airway Mallampati: II  TM Distance: >3 FB     Dental  (+) Chipped   Pulmonary neg pulmonary ROS,    Pulmonary exam normal breath sounds clear to auscultation       Cardiovascular hypertension, Pt. on medications Normal cardiovascular exam     Neuro/Psych negative neurological ROS  negative psych ROS   GI/Hepatic negative GI ROS, Neg liver ROS,   Endo/Other  negative endocrine ROS  Renal/GU negative Renal ROS  negative genitourinary   Musculoskeletal  (+) Arthritis , Rheumatoid disorders,  Chronic back pain   Abdominal Normal abdominal exam  (+)   Peds negative pediatric ROS (+)  Hematology negative hematology ROS (+)   Anesthesia Other Findings   Reproductive/Obstetrics                           Anesthesia Physical Anesthesia Plan  ASA: III and emergent  Anesthesia Plan: Spinal   Post-op Pain Management:    Induction: Intravenous  Airway Management Planned: Nasal Cannula  Additional Equipment:   Intra-op Plan:   Post-operative Plan:   Informed Consent: I have reviewed the patients History and Physical, chart, labs and discussed the procedure including the risks, benefits and alternatives for the proposed anesthesia with the patient or authorized representative who has indicated his/her understanding and acceptance.   Dental advisory given  Plan Discussed with: CRNA and Surgeon  Anesthesia Plan Comments:        Anesthesia Quick Evaluation

## 2015-04-05 NOTE — Progress Notes (Signed)
PT Cancellation Note  Patient Details Name: Angel Simmons MRN: [id number] DOB: 02-20-1929   Cancelled Treatment:    Reason Eval/Treat Not Completed: Medical issues which prohibited therapy. PT reviewed chart and noted patient will be undergoing surgery today. PT will defer mobility evaluation until after orthopedic surgery.    Kerin Ransom, PT, DPT    04/05/2015, 8:25 AM

## 2015-04-05 NOTE — Progress Notes (Signed)
[facility] [place] at [facility]   PATIENT NAME: Angel Simmons    MR#:  [id number]  DATE OF BIRTH:  1928-10-23  SUBJECTIVE:  CHIEF COMPLAINT:   Chief Complaint  Patient presents with  . Fall  . Hip Pain   Patient here with fall and noted to have a left hip fracture. Going to Surgery later today.  REVIEW OF SYSTEMS:    Review of Systems  Constitutional: Negative for fever and chills.  HENT: Negative for congestion and tinnitus.   Eyes: Negative for blurred vision and double vision.  Respiratory: Negative for cough, shortness of breath and wheezing.   Cardiovascular: Negative for chest pain, orthopnea and PND.  Gastrointestinal: Negative for nausea, vomiting, abdominal pain and diarrhea.  Genitourinary: Negative for dysuria and hematuria.  Musculoskeletal: Positive for joint pain (left hip).  Neurological: Negative for dizziness, sensory change and focal weakness.  All other systems reviewed and are negative.   Nutrition: Currently NPO due to surgery later today Tolerating Diet: No Tolerating PT: Await Eval post-op   DRUG ALLERGIES:   Allergies  Allergen Reactions  . Tylenol [Acetaminophen] Other (See Comments)    Reaction:  Headaches     VITALS:  Blood pressure 118/49, pulse 64, temperature 97.8 F (36.6 C), temperature source Oral, resp. rate 16, height 5\' 4"  (1.626 m), weight 60.147 kg (132 lb 9.6 oz), SpO2 98 %.  PHYSICAL EXAMINATION:   Physical Exam  GENERAL:  81 y.o.-year-old patient lying in the bed with no acute distress.  EYES: Pupils equal, round, reactive to light and accommodation. No scleral icterus. Extraocular muscles intact.  HEENT: Head atraumatic, normocephalic. Oropharynx and nasopharynx clear.  NECK:  Supple, no jugular venous distention. No thyroid enlargement, no tenderness.  LUNGS: Normal breath sounds bilaterally, no wheezing, rales, rhonchi. No use of accessory muscles of respiration.  CARDIOVASCULAR: S1, S2  normal. No murmurs, rubs, or gallops.  ABDOMEN: Soft, nontender, nondistended. Bowel sounds present. No organomegaly or mass.  EXTREMITIES: No cyanosis, clubbing or edema b/l.  Left lower extremity shortened and externally rotated due to the hip fracture. Left lower extremity is in traction  NEUROLOGIC: Cranial nerves II through XII are intact. No focal Motor or sensory deficits b/l.   PSYCHIATRIC: The patient is alert and oriented x 3. Good affect.  SKIN: No obvious rash, lesion, or ulcer.    LABORATORY PANEL:   CBC  Recent Labs Lab 04/05/15 0427  WBC 7.6  HGB 11.0*  HCT 33.2*  PLT 189   ------------------------------------------------------------------------------------------------------------------  Chemistries   Recent Labs Lab 04/04/15 1558 04/05/15 0427  NA 134* 135  K 3.8 4.0  CL 102 104  CO2 24 26  GLUCOSE 96 101*  BUN 17 15  CREATININE 0.83 0.78  CALCIUM 8.9 8.7*  AST 27  --   ALT 15  --   ALKPHOS 57  --   BILITOT 0.4  --    ------------------------------------------------------------------------------------------------------------------  Cardiac Enzymes No results for input(s): TROPONINI in the last 168 hours. ------------------------------------------------------------------------------------------------------------------  RADIOLOGY:  Dg Chest 1 View  04/04/2015  CLINICAL DATA:  Fall today.  Left side hip pain, hip fracture. EXAM: CHEST 1 VIEW COMPARISON:  09/05/2005 FINDINGS: Mitral valve annular calcifications. Slight elevation of the left hemidiaphragm. Heart is normal size. Heart is borderline in size. No confluent airspace opacities or effusions. No acute bony abnormality. IMPRESSION: No active disease. Electronically Signed   By: 09/07/2005 M.D.   On: 04/04/2015 16:51   Dg Hip Unilat With  Pelvis 2-3 Views Left  04/04/2015  CLINICAL DATA:  Fall in kitchen today.  Left hip pain. EXAM: DG HIP (WITH OR WITHOUT PELVIS) 2-3V LEFT COMPARISON:  None.  FINDINGS: There is a left femoral neck fracture with varus angulation. No subluxation or dislocation. 3 screws noted within the right proximal femur. IMPRESSION: Left femoral neck fracture with varus angulation. Electronically Signed   By: Charlett Nose M.D.   On: 04/04/2015 16:48     ASSESSMENT AND PLAN:   81 year old female with past medical history of hypertension, rheumatoid arthritis, chronic back pain, spinal stenosis who presented to the hospital after a mechanical fall and noted to have a left hip fracture.  #1 left hip fracture-status post due to mechanical fall. -Patient is low risk for noncardiac surgery and is going to the OR later today -Appreciate orthopedics input and continue care as per them  #2 hypertension-continue lisinopril. - PRN hydralazine.   #3 rheumatoid arthritis and spinal stenosis- resume PT post-operatively.  Clinically no acute issue.   #4 GERD - cont. Protonix.     All the records are reviewed and case discussed with Care Management/Social Workerr. Management plans discussed with the patient, family and they are in agreement.  CODE STATUS: Full  DVT Prophylaxis: Likely Lovenox postoperatively  TOTAL TIME TAKING CARE OF THIS PATIENT: 25 minutes.   POSSIBLE D/C IN 2-3 DAYS, DEPENDING ON CLINICAL CONDITION.   Houston Siren M.D on 04/05/2015 at 1:53 PM  Between 7am to 6pm - Pager - [phone]  After 6pm go to [email or web address] - password [email or web address] [facility]  [facility]  Office  [phone]  CC: Primary care physician; No primary care provider on file.

## 2015-04-05 NOTE — Progress Notes (Signed)
Initial Nutrition Assessment   INTERVENTION:   Meals and Snacks: Cater to patient preferences once diet order advanced. RD notes SLP evaluation pending. Medical Food Supplement Therapy: will recommend Ensure Enlive po BID, each supplement provides 350 kcal and 20 grams of protein,m once diet order able to be advanced for added nutrition to promote healing  NUTRITION DIAGNOSIS:   Inadequate oral intake related to inability to eat as evidenced by NPO status.  GOAL:   Patient will meet greater than or equal to 90% of their needs  MONITOR:    (Energy Intake, Anthropometrics, Digestive System)  REASON FOR ASSESSMENT:   Consult Hip fracture protocol  ASSESSMENT:   Pt admitted with left hip fracture s/p fall scheduled for left hip arthroplasty 04/05/2015.   Past Medical History  Diagnosis Date  . Hip fx (HCC)   . Hypertension   . Chronic back pain   . Rheumatoid arthritis (HCC)   . Spinal stenosis     Diet Order:  Diet NPO time specified    Current Nutrition: Pt currently NPO for procedure.   Food/Nutrition-Related History: Pt reports eating 3 small meals per day PTA. Pt reports really liking vegetables the best and would favor eating vegetable plates over large meat meals such as lasagna. Pt reports family brings meals often. Pt reports not drinking supplements PTA.   Scheduled Medications:  .  ceFAZolin (ANCEF) IV  2 g Intravenous To OR  . docusate sodium  100 mg Oral BID  . lisinopril  20 mg Oral Daily  . pantoprazole  40 mg Oral BID  . senna-docusate  1 tablet Oral BID    Continuous Medications:  . sodium chloride 75 mL/hr at 04/04/15 2000     Electrolyte/Renal Profile and Glucose Profile:   Recent Labs Lab 04/04/15 1558 04/05/15 0427  NA 134* 135  K 3.8 4.0  CL 102 104  CO2 24 26  BUN 17 15  CREATININE 0.83 0.78  CALCIUM 8.9 8.7*  GLUCOSE 96 101*   Protein Profile:  Recent Labs Lab 04/04/15 1558  ALBUMIN 3.7    Gastrointestinal  Profile: Last BM:  04/03/2015   Nutrition-Focused Physical Exam Findings:  Unable to complete Nutrition-Focused physical exam at this time.    Weight Change: Pt reports weight loss according to Dr. Renie Ora office recently which scale there reported weight of 111lbs, however pt reports on admission weight was UBW of 117lbs. Pt reports weight of 150s before husband died (unable to clarify how long ago)   Height:   Ht Readings from Last 1 Encounters:  04/04/15 5\' 4"  (1.626 m)    Weight:   Wt Readings from Last 1 Encounters:  04/04/15 132 lb 9.6 oz (60.147 kg)     BMI:  Body mass index is 22.75 kg/(m^2).  Estimated Nutritional Needs:   Kcal:  BEE: 1035kcals, TEE: (IF 1.1-1.3)(AF 1.2) 1366-1642kcals  Protein:  66-78g protein (1.1-1.3g/kg)  Fluid:  1500-1823mL of fluid (25-82mL/kg)  EDUCATION NEEDS:   No education needs identified at this time   MODERATE Care Level  31m, RD, LDN Pager [phone]

## 2015-04-05 NOTE — Transfer of Care (Signed)
Immediate Anesthesia Transfer of Care Note  Patient: Angel Simmons  Procedure(s) Performed: Procedure(s): ARTHROPLASTY BIPOLAR HIP (HEMIARTHROPLASTY) (Left)  Patient Location: PACU  Anesthesia Type:Spinal  Level of Consciousness: sedated  Airway & Oxygen Therapy: Patient Spontanous Breathing and Patient connected to face mask oxygen  Post-op Assessment: Report given to RN and Post -op Vital signs reviewed and stable  Post vital signs: Reviewed and stable  Last Vitals:  Filed Vitals:   04/05/15 1527 04/05/15 2155  BP: 130/55   Pulse: 73 60  Temp: 36.8 C   Resp: 16 22    Complications: No apparent anesthesia complications

## 2015-04-05 NOTE — Care Management Note (Signed)
Case Management Note  Patient Details  Name: Angel Simmons MRN: [id number] Date of Birth: [birth year]  Subjective/Objective:                  Met with patient and her daughter to discuss discharge planning. Prior to surgery today. She would like to go to [facility] at discharge. PT pending post surgery. She is from home alone where she was being seen at [facility] outpatient PT and recently stopped using her walker (Rollator). She uses [facility] for Rx : [phone]. She was independent at home using an exercise bicycle and driving short distances- grocery store. She also has [employer] Alert system.  Action/Plan: List of home health providers left with patient. RNCM will continue to follow.   Expected Discharge Date:                  Expected Discharge Plan:     In-House Referral:  Clinical Social Work  Discharge planning Services  CM Consult  Post Acute Care Choice:    Choice offered to:  Patient, Sibling, Adult Children  DME Arranged:    DME Agency:     HH Arranged:    [place] Agency:     Status of Service:  In process, will continue to follow  Medicare Important Message Given:    Date Medicare IM Given:    Medicare IM give by:    Date Additional Medicare IM Given:    Additional Medicare Important Message give by:     If discussed at [place] of Stay Meetings, dates discussed:    Additional Comments:  Marshell Garfinkel, RN 04/05/2015, 11:05 AM

## 2015-04-05 NOTE — Anesthesia Procedure Notes (Addendum)
Spinal Patient location during procedure: OR  Spinal  Start time: 04/05/2015 6:45 PM End time: 04/05/2015 6:50 PM Staffing Anesthesiologist: Yves Dill Performed by: anesthesiologist  Preanesthetic Checklist Completed: patient identified, site marked, surgical consent, pre-op evaluation, timeout performed, IV checked, risks and benefits discussed and monitors and equipment checked Spinal Block Patient position: sitting Prep: Betadine and site prepped and draped Patient monitoring: heart rate, cardiac monitor, continuous pulse ox and blood pressure Approach: midline Location: L3-4 Injection technique: single-shot Needle Needle type: Quincke  Needle gauge: 25 G Assessment Sensory level: T8 Additional Notes Time out called.  Patient lightly sedated and placed in sitting position.  Prepped and draped in sterile fashion.  A skin wheal was made with 1% Lidocaine plain.  A 25G Quinke needle was advanced until clear, colorless CSF in all 4 quadrants was obtained.  No paresthesias and no blood.  Injection of 10mg  of spinal marcaine and 3mg  of tetracaine with 1:200K epi.  Patient tolerated the procedure well

## 2015-04-05 NOTE — Op Note (Signed)
OPERATIVE NOTE  DATE OF SURGERY:  04/04/2015 - 04/05/2015  PATIENT NAME:  Angel Simmons   DOB: 1929-02-22  MRN: [id number]  PRE-OPERATIVE DIAGNOSIS: Left femoral neck fracture  POST-OPERATIVE DIAGNOSIS:  Same  PROCEDURE:  Left hip hemiarthroplasty  SURGEON:  Jena Gauss. M.D.  ANESTHESIA: spinal  ESTIMATED BLOOD LOSS: 75 mL  FLUIDS REPLACED: 1200 mL of crystalloid  DRAINS: 2 medium drains to a Hemovac reservoir  IMPLANTS UTILIZED: DePuy size 3 Summit femoral stem (cemented), 11 mm Cementralizer, 47 mm OD Cathcart hip ball, +0 mm tapered spacer, and a size 3 femoral cement restrictor  INDICATIONS FOR SURGERY: Angel Simmons is a 81 y.o. year old female who fell and sustained a displaced left femoral neck fracture. After discussion of the risks and benefits of surgical intervention, the patient expressed understanding of the risks benefits and agree with plans for hip hemiarthroplasty.   The risks, benefits, and alternatives were discussed at length including but not limited to the risks of infection, bleeding, nerve injury, stiffness, blood clots, the need for revision surgery, limb length inequality, dislocation, cardiopulmonary complications, among others, and they were willing to proceed.  PROCEDURE IN DETAIL: The patient was brought into the operating room and, after adequate spinal anesthesia was achieved, patient was placed in a right lateral decubitus position. Axillary roll was placed and all bony prominences were well-padded. The patient's left hip was cleaned and prepped with alcohol and DuraPrep and draped in the usual sterile fashion. A "timeout" was performed as per usual protocol. A lateral curvilinear incision was made gently curving towards the posterior superior iliac spine. The IT band was incised in line with the skin incision and the fibers of the gluteus maximus were split in line. The piriformis tendon was identified, skeletonized, and incised at its insertion  to the proximal femur and reflected posteriorly. A T type posterior capsulotomy was performed. The femoral head was then removed using a corkscrew device. The femoral head was measured using calipers and ring gauges and determined to be 47 mm in diameter.The femoral neck cut was performed using an oscillating saw. The acetabulum was inspected for any bony fragments. The articular surface was in good condition.  Attention was then directed to the proximal femur. A pilot hole for preparation of the proximal femoral canal was created using a high-speed bur. The femoral canal finder was inserted followed by insertion of the conical reamer. Serial broaches were inserted up to a size 3 broach. Calcar region was planed and a trial reduction was performed using a 47 mm OD Cathcart ball with a +0 mm neck length. Good equalization of limb lengths was appreciated and excellent stability was noted both anteriorly and posteriorly. Trial components were removed. The femoral canal was sized and was felt that a size 3 cement restrictor was appropriate. The cement restrictor was inserted to the appropriate depth in the femoral canal was irrigated with copious amounts of fluid using the pulse lavage and suctioned dry. The femoral canal was then packed with vaginal packing soaked in dilute Neo-Synephrine. Polymethylmethacrylate cement with gentamicin was per usual fashion using a vacuum mixer. Vaginal packing was removed and the canal again irrigated and suctioned dry. The polymethylmethacrylate cement was inserted in retrograde fashion and pressurized. The size 3 Summit femoral component with a 11 mm Cementralizer was positioned and impacted into place. Excess cement was removed using Personal assistant. After adequate curing of the cement, the Morse taper was cleaned and dried. A 47 mm outer  diameter Cathcart hip ball with a +0 mm tapered spacer was placed on the trunnion and impacted into place. The acetabulum was again irrigated  and suctioned dry, making sure to inspect for any residal bony debris. The femoral head was then reduced and placed through a range of motion. Excellent stability was noted both anteriorly and posteriorly. Good equalization of limb lengths was appreciated.   The wound was irrigated with copious amounts of normal saline with antibiotic solution and suctioned dry. Good hemostasis was appreciated. The posterior capsulotomy was repaired using #5 Ethibond. Piriformis tendon was reapproximated to the undersurface of the gluteus medius tendon using #5 Ethibond. Two medium drains were placed in the wound bed and brought out through separate stab incisions to be attached to a Hemovac reservoir. The IT band was reapproximated using interrupted sutures of #1 Vicryl. Subcutaneous tissue was proximal phalanx using first #0 Vicryl followed by #2-0 Vicryl. The skin was closed with skin staples.  The patient tolerated the procedure well and was transported to the recovery room in stable condition.   Jena Gauss., M.D.

## 2015-04-05 NOTE — Progress Notes (Signed)
Speech Therapy Note: received order, reviewed chart notes and consulted NSG/Dtrs present. Pt is NPO and due for surgery this PM sec. to fall at home and hip fx. Dtrs present in room deny any known swallowing problems at home. ST will f/u tomorrow once pt is able to take po's post surgery. NSG updated.

## 2015-04-06 ENCOUNTER — Encounter: Payer: Self-pay | Admitting: Orthopedic Surgery

## 2015-04-06 LAB — BASIC METABOLIC PANEL
ANION GAP: 6 (ref 5–15)
BUN: 17 mg/dL (ref 6–20)
CALCIUM: 7.9 mg/dL — AB (ref 8.9–10.3)
CO2: 25 mmol/L (ref 22–32)
Chloride: 106 mmol/L (ref 101–111)
Creatinine, Ser: 0.74 mg/dL (ref 0.44–1.00)
GLUCOSE: 125 mg/dL — AB (ref 65–99)
Potassium: 4 mmol/L (ref 3.5–5.1)
SODIUM: 137 mmol/L (ref 135–145)

## 2015-04-06 LAB — CBC
HCT: 29.2 % — ABNORMAL LOW (ref 35.0–47.0)
Hemoglobin: 9.6 g/dL — ABNORMAL LOW (ref 12.0–16.0)
MCH: 28.5 pg (ref 26.0–34.0)
MCHC: 32.8 g/dL (ref 32.0–36.0)
MCV: 86.9 fL (ref 80.0–100.0)
PLATELETS: 165 10*3/uL (ref 150–440)
RBC: 3.36 MIL/uL — AB (ref 3.80–5.20)
RDW: 14.2 % (ref 11.5–14.5)
WBC: 8.1 10*3/uL (ref 3.6–11.0)

## 2015-04-06 LAB — URINE CULTURE
CULTURE: NO GROWTH
SPECIAL REQUESTS: NORMAL

## 2015-04-06 MED ORDER — ENOXAPARIN SODIUM 30 MG/0.3ML ~~LOC~~ SOLN
30.0000 mg | Freq: Two times a day (BID) | SUBCUTANEOUS | Status: DC
  Administered 2015-04-06 – 2015-04-08 (×4): 30 mg via SUBCUTANEOUS
  Filled 2015-04-06 (×4): qty 0.3

## 2015-04-06 NOTE — Progress Notes (Signed)
Speech Therapy Note: received order, reviewed chart notes and consulted NSG re: pt's status. Family had denied any swallowing issues yesterday. Pt has now had her hip surgery and is alert/awake sitting in a chair eating her lunch meal w/ visitors. Pt denied any difficulty swallowing; no c/o coughing or throat clearing w/ po's. Pt conversed at conversational level w/out deficits. Pt is on a regular diet and taking meds w/ water w/ NSG; NSG denied any trouble swallowing w/ meds as well.  Due to pt appearing at her baseline w/ swallowing and no reports of dysphagia, ST will sign off at this time w/ NSG to reconsult if nec. while admitted. Pt/family agreed.

## 2015-04-06 NOTE — NC FL2 (Signed)
MEDICAID FL2 LEVEL OF CARE SCREENING TOOL     IDENTIFICATION  Patient Name: Angel Simmons Birthdate: Feb 05, 1929 Sex: female Admission Date (Current Location): 04/04/2015  [place] and [place] Number:  [facility] )   Facility and Address:  [facility], [address] [place], [place] [address]      Provider Number: [id number]  Attending Physician Name and Address:  Alford Highland, MD  Relative Name and Phone Number:       Current Level of Care: Hospital Recommended Level of Care: Skilled Nursing Facility Prior Approval Number:    Date Approved/Denied:   PASRR Number:  [id number])  Discharge Plan: SNF    Current Diagnoses: Patient Active Problem List   Diagnosis Date Noted  . Hip fracture (HCC) 04/04/2015    Orientation ACTIVITIES/SOCIAL BLADDER RESPIRATION    Self, Time, Situation, Place  Active Continent Normal  BEHAVIORAL SYMPTOMS/MOOD NEUROLOGICAL BOWEL NUTRITION STATUS   (none )  (none ) Continent Diet (Regular Diet )  PHYSICIAN VISITS COMMUNICATION OF NEEDS Height & Weight Skin  30 days Verbally 5\' 4"  (162.6 cm) 132 lbs. Surgical wounds (Incision: Left Hip. )          AMBULATORY STATUS RESPIRATION    Assist extensive Normal      Personal Care Assistance Level of Assistance  Bathing, Feeding, Dressing Bathing Assistance: Limited assistance Feeding assistance: Independent Dressing Assistance: Limited assistance      Functional Limitations Info  Sight, Hearing, Speech Sight Info: Adequate Hearing Info: Impaired Speech Info: Adequate       SPECIAL CARE FACTORS FREQUENCY  PT (By licensed PT), OT (By licensed OT)     PT Frequency:  (5) OT Frequency:  (5)           Additional Factors Info  Code Status, Allergies Code Status Info:  (Full Code. ) Allergies Info:  (Tylenol Acetaminophen)           Current Medications (04/06/2015): Current Facility-Administered Medications  Medication Dose  Route Frequency Provider Last Rate Last Dose  . bisacodyl (DULCOLAX) suppository 10 mg  10 mg Rectal Daily PRN 04/08/2015, MD      . enoxaparin (LOVENOX) injection 30 mg  30 mg Subcutaneous Q12H Donato Heinz, MD      . feeding supplement (ENSURE ENLIVE) (ENSURE ENLIVE) liquid 237 mL  237 mL Oral Q24H Donato Heinz, MD   237 mL at 04/06/15 1000  . ferrous sulfate tablet 325 mg  325 mg Oral BID WC 04/08/15, MD   325 mg at 04/06/15 1648  . hydrALAZINE (APRESOLINE) injection 10 mg  10 mg Intravenous Q6H PRN 04/08/15, MD      . lisinopril (PRINIVIL,ZESTRIL) tablet 20 mg  20 mg Oral Daily Enid Baas, MD   20 mg at 04/06/15 0754  . magnesium hydroxide (MILK OF MAGNESIA) suspension 30 mL  30 mL Oral Daily PRN 04/08/15, MD      . menthol-cetylpyridinium (CEPACOL) lozenge 3 mg  1 lozenge Oral PRN Donato Heinz, MD       Or  . phenol (CHLORASEPTIC) mouth spray 1 spray  1 spray Mouth/Throat PRN Donato Heinz, MD      . metoCLOPramide (REGLAN) tablet 5-10 mg  5-10 mg Oral Q8H PRN Donato Heinz, MD       Or  . metoCLOPramide (REGLAN) injection 5-10 mg  5-10 mg Intravenous Q8H PRN 7-10, MD      . metoCLOPramide (  REGLAN) tablet 10 mg  10 mg Oral TID AC & HS Donato Heinz, MD   10 mg at 04/06/15 1648  . morphine 2 MG/ML injection 2-4 mg  2-4 mg Intravenous Q4H PRN Donato Heinz, MD   2 mg at 04/06/15 0303  . ondansetron (ZOFRAN) tablet 4 mg  4 mg Oral Q6H PRN Donato Heinz, MD       Or  . ondansetron (ZOFRAN) injection 4 mg  4 mg Intravenous Q6H PRN Donato Heinz, MD      . oxyCODONE (Oxy IR/ROXICODONE) immediate release tablet 5-10 mg  5-10 mg Oral Q4H PRN Donato Heinz, MD   5 mg at 04/06/15 0606  . senna-docusate (Senokot-S) tablet 1 tablet  1 tablet Oral BID Donato Heinz, MD   1 tablet at 04/06/15 0755  . sodium phosphate (FLEET) 7-19 GM/118ML enema 1 enema  1 enema Rectal Once PRN Donato Heinz, MD      . traMADol Janean Sark) tablet 50-100 mg  50-100 mg  Oral Q4H PRN Donato Heinz, MD       Do not use this list as official medication orders. Please verify with discharge summary.  Discharge Medications:   Medication List    ASK your doctor about these medications        lisinopril 20 MG tablet  Commonly known as:  PRINIVIL,ZESTRIL  Take 20 mg by mouth daily.     naproxen sodium 220 MG tablet  Commonly known as:  ANAPROX  Take 220-440 mg by mouth 2 (two) times daily as needed (for pain).     oxyCODONE 5 MG immediate release tablet  Commonly known as:  Oxy IR/ROXICODONE  Take 2.5 mg by mouth 3 (three) times daily as needed for severe pain.        Relevant Imaging Results:  Relevant Lab Results:  Recent Labs    Additional Information  (SSN: [id number])  Haig Prophet, LCSW

## 2015-04-06 NOTE — Progress Notes (Signed)
Physical Therapy Treatment Patient Details Name: Angel Simmons MRN: [id number] DOB: 1929-03-26 Today's Date: 04/06/2015    History of Present Illness This patient is an 81 year old female who came to [facility] after a fall suffering a L hip fracture. She recieved a hemiarthroplasty repair.    PT Comments    Patient appears slightly more confused on this session than previous session. She demonstrates improved effort with bed mobility and transfers, though she is unable to ambulate at this time due to pain. She attempts a side step at the bed and collapses back on to the bed secondary to pain. She demonstrates good effort with bed exercises, though continues to self limit secondary to pain. Skilled PT services continue to be recommended to address her mobility deficits.   Follow Up Recommendations  SNF     Equipment Recommendations  Rolling walker with 5" wheels    Recommendations for Other Services       Precautions / Restrictions Precautions Precautions: Posterior Hip Restrictions Weight Bearing Restrictions: Yes LLE Weight Bearing: Weight bearing as tolerated    Mobility  Bed Mobility Overal bed mobility: Needs Assistance Bed Mobility: Supine to Sit;Sit to Supine     Supine to sit: Min assist;Mod assist Sit to supine: Mod assist   General bed mobility comments: Patient demonstrates improved ability to use UEs with bed rails and bring her LEs on and off the edge of the bed.   Transfers Overall transfer level: Needs assistance Equipment used: Rolling walker (2 wheeled) Transfers: Sit to/from Stand Sit to Stand: Mod assist;Max assist         General transfer comment: Patient continues to lean heavily on RLE as she puts minimal weight through her LLE.  Ambulation/Gait             General Gait Details: Attempted a side side to her R with RW and PT assistance, patient buckles secondary to pain and returns to bed.    Stairs            Wheelchair Mobility     Modified Rankin (Stroke Patients Only)       Balance Overall balance assessment: Needs assistance Sitting-balance support: Bilateral upper extremity supported Sitting balance-Leahy Scale: Fair   Postural control: Posterior lean   Standing balance-Leahy Scale: Poor Standing balance comment: Patient is able to provide better standing effort, unable to perform any dynamic movement secondary to pain                    Cognition Arousal/Alertness: Awake/alert Behavior During Therapy: WFL for tasks assessed/performed Overall Cognitive Status:  (Patient demonstrates confusion "tomorrow is smile day" though she generally follows all commands and behaves appropriately. )       Memory: Decreased recall of precautions              Exercises Total Joint Exercises Ankle Circles/Pumps: AROM;Both;10 reps Heel Slides: AAROM;AROM;Both;10 reps Hip ABduction/ADduction: AROM;AAROM;Both;10 reps Straight Leg Raises: AROM;AAROM;Both;10 reps (Painful dc'd)    General Comments        Pertinent Vitals/Pain Pain Assessment:  (Patient states minimal pain at rest, however during ambulation attempt she buckles secondary to pain and is returned to bed. )    Home Living                      Prior Function            PT Goals (current goals can now be found in the care plan  section) Acute Rehab PT Goals Patient Stated Goal: To go to rehab PT Goal Formulation: With patient Time For Goal Achievement: 04/20/15 Potential to Achieve Goals: Fair Progress towards PT goals: Progressing toward goals    Frequency  BID    PT Plan Current plan remains appropriate    Co-evaluation             End of Session Equipment Utilized During Treatment: Gait belt Activity Tolerance: Patient limited by pain Patient left: in bed;with SCD's reapplied;with call bell/phone within reach;with bed alarm set     Time: 3009-2330 PT Time Calculation (min) (ACUTE ONLY): 14  min  Charges:  $Therapeutic Exercise: 8-22 mins                    G Codes:      Kerin Ransom, PT, DPT    04/06/2015, 5:39 PM

## 2015-04-06 NOTE — Evaluation (Signed)
Occupational Therapy Evaluation Patient Details Name: CARIA TRANSUE MRN: [id number] DOB: 08-09-1928 Today's Date: 04/06/2015    History of Present Illness This patinet is an 81 year old female who came to [facility] after a fall suffering a L hip fracture. She recieved a hemiarthroplasty repair.   Clinical Impression   This patient is an 81 year old female who came to [facility] with the above history. She lives alone and had been independent with activities of daily living and functional mobility. She now needs assist and would benefit from Occupational Therapy for ADL/functional mobility training while .staying within hip precautiins (posterior approach).    Follow Up Recommendations  SNF    Equipment Recommendations       Recommendations for Other Services       Precautions / Restrictions Precautions Precautions: Posterior Hip Restrictions Weight Bearing Restrictions: Yes LLE Weight Bearing: Weight bearing as tolerated      Mobility Bed Mobility                  Transfers                      Balance                                            ADL                                         General ADL Comments: Patient had been independent. She now needs assist. Practiced techniques for lower body dressing while staying within hip precautions (posterior approach) with hand over hand assist using hip kit and verbal cues for technique and safety.     Vision     Perception     Praxis      Pertinent Vitals/Pain       Hand Dominance     Extremity/Trunk Assessment Upper Extremity Assessment Upper Extremity Assessment: Overall WFL for tasks assessed   Lower Extremity Assessment Lower Extremity Assessment: Defer to PT evaluation       Communication Communication Communication: No difficulties   Cognition Arousal/Alertness: Awake/alert Behavior During Therapy: WFL for tasks  assessed/performed Overall Cognitive Status: Within Functional Limits for tasks assessed       Memory: Decreased recall of precautions             General Comments       Exercises       Shoulder Instructions      Home Living   Living Arrangements: Alone                                      Prior Functioning/Environment Level of Independence: Independent             OT Diagnosis: Acute pain   OT Problem List: Decreased strength;Decreased range of motion;Decreased activity tolerance;Impaired balance (sitting and/or standing);Decreased knowledge of use of DME or AE;Decreased knowledge of precautions;Pain   OT Treatment/Interventions: Self-care/ADL training    OT Goals(Current goals can be found in the care plan section)    OT Frequency: Min 1X/week   Barriers to D/C:  Co-evaluation              End of Session Equipment Utilized During Treatment:  (hip kit)  Activity Tolerance:   Patient left: in chair;with call bell/phone within reach;with family/visitor present   Time: 1019-1050 OT Time Calculation (min): 31 min Charges:  OT General Charges $OT Visit: 1 Procedure OT Evaluation $Initial OT Evaluation Tier I: 1 Procedure OT Treatments $Self Care/Home Management : 8-22 mins G-Codes:    Myrene Galas, MS/OTR/L   04/06/2015, 11:10 AM

## 2015-04-06 NOTE — Clinical Social Work Note (Signed)
Clinical Social Work Assessment  Patient Details  Name: Angel Simmons MRN: [id number] Date of Birth: [birth year]  Date of referral:  04/06/15               Reason for consult:  Facility Placement                Permission sought to share information with:  Chartered certified accountant granted to share information::  Yes, Verbal Permission Granted  Name::      Angel Simmons::   [place]   Relationship::     Contact Information:     Housing/Transportation Living arrangements for the past 2 months:  [place] of Information:  Patient Patient Interpreter Needed:  None Criminal Activity/Legal Involvement Pertinent to Current Situation/Hospitalization:  No - Comment as needed Significant Relationships:  Adult Children Lives with:  Self Do you feel safe going back to the place where you live?  Yes Need for family participation in patient care:  Yes (Comment)  Care giving concerns:  Patient lives alone in [place].    Social Worker assessment / plan: Holiday representative (CSW) met with patient to discuss D/C plan. Patient was pleasant and alert and oriented. Patient reported that she lives in [place] alone and has 3 daughters. Patient reported that she is agreeable to SNF search and prefers [facility]. Patient reported that she has been to [facility] in the past.   Angel Simmons admissions coordinator at [facility] offered patient a bed. Patient accepted bed offer. Plan is for patient to D/C to [facility] Friday 04/08/15. CSW will continue to follow and assist as needed.   Employment status:  Disabled (Comment on whether or not currently receiving Disability), Retired Forensic scientist:  Medicare PT Recommendations:  [place] / Referral to community resources:  [place]  Patient/Family's Response to care:  Patient is agreeable to going to [facility] for short term rehab.    Patient/Family's Understanding of and Emotional Response to Diagnosis, Current Treatment, and Prognosis:  Patient was pleasant throughout assessment.   Emotional Assessment Appearance:  Appears stated age Attitude/Demeanor/Rapport:    Affect (typically observed):  Accepting, Adaptable, Pleasant Orientation:  Oriented to Self, Oriented to Place, Oriented to  Time, Oriented to Situation Alcohol / Substance use:  Not Applicable Psych involvement (Current and /or in the community):  No (Comment)  Discharge Needs  Concerns to be addressed:  Discharge Planning Concerns Readmission within the last 30 days:  No Current discharge risk:  Dependent with Mobility Barriers to Discharge:  Continued Medical Work up   Angel Freshwater, LCSW 04/06/2015, 6:04 PM

## 2015-04-06 NOTE — Anesthesia Postprocedure Evaluation (Signed)
Anesthesia Post Note  Patient: Angel Simmons  Procedure(s) Performed: Procedure(s) (LRB): ARTHROPLASTY BIPOLAR HIP (HEMIARTHROPLASTY) (Left)  Patient location during evaluation: Nursing Unit Anesthesia Type: Spinal Level of consciousness: awake and alert and oriented Pain management: pain level controlled Vital Signs Assessment: post-procedure vital signs reviewed and stable Respiratory status: spontaneous breathing and nonlabored ventilation Cardiovascular status: stable Postop Assessment: No headache, No backache, Patient able to bend at knees and No signs of nausea or vomiting Anesthetic complications: no    Last Vitals:  Filed Vitals:   04/06/15 0354 04/06/15 0457  BP: 133/59 136/53  Pulse: 75 70  Temp: 36.8 C 36.6 C  Resp: 18 18    Last Pain:  Filed Vitals:   04/06/15 0612  PainSc: 4     LLE Motor Response: Purposeful movement LLE Sensation: Full sensation RLE Motor Response: Purposeful movement RLE Sensation: Full sensation      [employer]

## 2015-04-06 NOTE — Care Management Important Message (Signed)
Important Message  Patient Details  Name: Angel Simmons MRN: [id number] Date of Birth: 1929-01-03   Medicare Important Message Given:  Yes    [name] 04/06/2015, 10:58 AM

## 2015-04-06 NOTE — Progress Notes (Addendum)
   Subjective: 1 Day Post-Op Procedure(s) (LRB): ARTHROPLASTY BIPOLAR HIP (HEMIARTHROPLASTY) (Left) Patient reports pain as 5 on 0-10 scale.   Patient is well, and has had no acute complaints or problems We will start therapy today.  Plan is to go Rehab after hospital stay. no nausea and no vomiting. Did have some nausea yesterday Patient denies any chest pains or shortness of breath. Objective: Vital signs in last 24 hours: Temp:  [95 F (35 C)-98.2 F (36.8 C)] 97.9 F (36.6 C) (11/23 0457) Pulse Rate:  [51-89] 70 (11/23 0457) Resp:  [9-27] 18 (11/23 0457) BP: (107-165)/(43-70) 136/53 mmHg (11/23 0457) SpO2:  [92 %-100 %] 93 % (11/23 0457) well approximated incision Heels are non tender and elevated off the bed using rolled towels Intake/Output from previous day: 11/22 0701 - 11/23 0700 In: 1600 [I.V.:1600] Out: 1655 [Urine:1320; Drains:130; Blood:75] Intake/Output this shift:     Recent Labs  04/04/15 1558 04/05/15 0427 04/06/15 0542  HGB 11.5* 11.0* 9.6*    Recent Labs  04/05/15 0427 04/06/15 0542  WBC 7.6 8.1  RBC 3.82 3.36*  HCT 33.2* 29.2*  PLT 189 165    Recent Labs  04/05/15 0427 04/06/15 0542  NA 135 137  K 4.0 4.0  CL 104 106  CO2 26 25  BUN 15 17  CREATININE 0.78 0.74  GLUCOSE 101* 125*  CALCIUM 8.7* 7.9*    Recent Labs  04/04/15 1558  INR 0.94    EXAM General - Patient is Alert, Appropriate and Oriented Extremity - Neurologically intact Neurovascular intact Sensation intact distally Intact pulses distally Dorsiflexion/Plantar flexion intact Dressing - dressing C/D/I Motor Function - intact, moving foot and toes well on exam.  Good strength against resistance of dorsiflexion and plantar flexion  Past Medical History  Diagnosis Date  . Hip fx (HCC)   . Hypertension   . Chronic back pain   . Rheumatoid arthritis (HCC)   . Spinal stenosis     Assessment/Plan: 1 Day Post-Op Procedure(s) (LRB): ARTHROPLASTY BIPOLAR HIP  (HEMIARTHROPLASTY) (Left) Active Problems:   Hip fracture (HCC)  Estimated body mass index is 22.75 kg/(m^2) as calculated from the following:   Height as of this encounter: 5\' 4"  (1.626 m).   Weight as of this encounter: 60.147 kg (132 lb 9.6 oz). Advance diet Up with therapy D/C IV fluids Discharge to SNF on fri  Labs: reviewed DVT Prophylaxis - Lovenox, Foot Pumps and TED hose Weight-Bearing as tolerated to left leg D/C O2 and Pulse OX and try on Room Air Needs to start working on a bowel movement Staples will need to be removed 14 days post op Will need to f/u in [place] clinic in 6 weeks with dr. [place]. Change dressing as needed  Ernest Pine. [facility] PA [facility] 04/06/2015, 7:16 AM

## 2015-04-06 NOTE — Progress Notes (Addendum)
Patient ID: Angel Simmons, female   DOB: [birth year], 81 y.o.   MRN: [id number] [facility] PROGRESS NOTE  PCP: No primary care provider on file.  HPI/Subjective: Patient having pain and left hip. She states that she sat in the chair too long and had pain. She is breathing comfortably.  Objective: Filed Vitals:   04/06/15 0727 04/06/15 1543  BP: 134/57 146/59  Pulse: 75 81  Temp: 97.9 F (36.6 C) 98.9 F (37.2 C)  Resp: 18 18    Filed Weights   04/04/15 1553 04/04/15 1954  Weight: 53.071 kg (117 lb) 60.147 kg (132 lb 9.6 oz)    ROS: Review of Systems  Constitutional: Negative for fever and chills.  Eyes: Negative for blurred vision.  Respiratory: Negative for cough and shortness of breath.   Cardiovascular: Negative for chest pain.  Gastrointestinal: Negative for nausea, vomiting, abdominal pain, diarrhea and constipation.  Genitourinary: Negative for dysuria.  Musculoskeletal: Positive for joint pain.  Neurological: Negative for dizziness and headaches.   Exam: Physical Exam  Constitutional: She is oriented to person, place, and time.  HENT:  Nose: No mucosal edema.  Mouth/Throat: No oropharyngeal exudate or posterior oropharyngeal edema.  Eyes: Conjunctivae, EOM and lids are normal. Pupils are equal, round, and reactive to light.  Neck: No JVD present. Carotid bruit is not present. No edema present. No thyroid mass and no thyromegaly present.  Cardiovascular: S1 normal and S2 normal.  Exam reveals no gallop.   No murmur heard. Pulses:      Dorsalis pedis pulses are 2+ on the right side, and 2+ on the left side.  Respiratory: No respiratory distress. She has no wheezes. She has no rhonchi. She has no rales.  GI: Soft. Bowel sounds are normal. There is no tenderness.  Musculoskeletal:       Right ankle: She exhibits no swelling.       Left ankle: She exhibits no swelling.  Lymphadenopathy:    She has no cervical adenopathy.  Neurological: She is alert  and oriented to person, place, and time. No cranial nerve deficit.  Skin: Skin is warm. No rash noted. Nails show no clubbing.  Psychiatric: She has a normal mood and affect.    Data Reviewed: Basic Metabolic Panel:  Recent Labs Lab 04/04/15 1558 04/05/15 0427 04/06/15 0542  NA 134* 135 137  K 3.8 4.0 4.0  CL 102 104 106  CO2 24 26 25   GLUCOSE 96 101* 125*  BUN 17 15 17   CREATININE 0.83 0.78 0.74  CALCIUM 8.9 8.7* 7.9*   Liver Function Tests:  Recent Labs Lab 04/04/15 1558  AST 27  ALT 15  ALKPHOS 57  BILITOT 0.4  PROT 6.8  ALBUMIN 3.7   CBC:  Recent Labs Lab 04/04/15 1558 04/05/15 0427 04/06/15 0542  WBC 6.7 7.6 8.1  HGB 11.5* 11.0* 9.6*  HCT 35.4 33.2* 29.2*  MCV 87.3 87.0 86.9  PLT 209 189 165     Recent Results (from the past 240 hour(s))  Urine culture     Status: None   Collection Time: 04/04/15  5:50 PM  Result Value Ref Range Status   Specimen Description URINE, RANDOM  Final   Special Requests Normal  Final   Culture NO GROWTH 2 DAYS  Final   Report Status 04/06/2015 FINAL  Final  MRSA PCR Screening     Status: None   Collection Time: 04/04/15  8:15 PM  Result Value Ref Range Status   MRSA by  PCR NEGATIVE NEGATIVE Final    Comment:        The GeneXpert MRSA Assay (FDA approved for NASAL specimens only), is one component of a comprehensive MRSA colonization surveillance program. It is not intended to diagnose MRSA infection nor to guide or monitor treatment for MRSA infections.      Studies: Dg Chest 1 View  04/04/2015  CLINICAL DATA:  Fall today.  Left side hip pain, hip fracture. EXAM: CHEST 1 VIEW COMPARISON:  09/05/2005 FINDINGS: Mitral valve annular calcifications. Slight elevation of the left hemidiaphragm. Heart is normal size. Heart is borderline in size. No confluent airspace opacities or effusions. No acute bony abnormality. IMPRESSION: No active disease. Electronically Signed   By: Charlett Nose M.D.   On: 04/04/2015 16:51    Dg Hip Port Unilat With Pelvis 1v Left  04/05/2015  CLINICAL DATA:  Status post left hip hemiarthroplasty. Initial encounter. EXAM: DG HIP (WITH OR WITHOUT PELVIS) 1V PORT LEFT COMPARISON:  Left hip radiograph performed 04/04/2015 FINDINGS: There has been interval placement of a left hip hemiarthroplasty, which appears grossly intact, without evidence of loosening. No new fractures are seen. Overlying postoperative change and scattered soft tissue air are noted. Screws at the right hip are grossly unchanged in appearance. The visualized bowel gas pattern is grossly unremarkable. IMPRESSION: Left hip hemiarthroplasty appears grossly intact, without evidence of loosening. No new fracture seen. Electronically Signed   By: Roanna Raider M.D.   On: 04/05/2015 22:36   Dg Hip Unilat With Pelvis 2-3 Views Left  04/04/2015  CLINICAL DATA:  Fall in kitchen today.  Left hip pain. EXAM: DG HIP (WITH OR WITHOUT PELVIS) 2-3V LEFT COMPARISON:  None. FINDINGS: There is a left femoral neck fracture with varus angulation. No subluxation or dislocation. 3 screws noted within the right proximal femur. IMPRESSION: Left femoral neck fracture with varus angulation. Electronically Signed   By: Charlett Nose M.D.   On: 04/04/2015 16:48    Scheduled Meds: .  ceFAZolin (ANCEF) IV  2 g Intravenous Q6H  . enoxaparin (LOVENOX) injection  30 mg Subcutaneous Q12H  . feeding supplement (ENSURE ENLIVE)  237 mL Oral Q24H  . ferrous sulfate  325 mg Oral BID WC  . lisinopril  20 mg Oral Daily  . metoCLOPramide  10 mg Oral TID AC & HS  . pantoprazole  40 mg Oral BID AC  . senna-docusate  1 tablet Oral BID   Continuous Infusions: . sodium chloride 100 mL/hr at 04/06/15 0007    Assessment/Plan:  1. Essential hypertension- continue low-dose lisinopril 2. Rheumatoid arthritis, spinal stenosis with chronic low back pain- continue pain control medications 3. Left hip fracture with drain- pain management with when necessary  medication. Further management as per orthopedic surgery. Likely out to rehabilitation on Friday. 4. Postoperative anemia- continue to monitor hemoglobin daily  Code Status:     Code Status Orders        Start     Ordered   04/05/15 2304  Full code   Continuous     04/05/15 2303    Advance Directive Documentation        Most Recent Value   Type of Advance Directive  Healthcare Power of Attorney, Living will   Pre-existing out of facility DNR order (yellow form or pink MOST form)     "MOST" Form in Place?       Disposition Plan: Likely rehabilitation on Friday  Consultants:  Orthopedic surgery  Time spent: 20 minutes  Loletha Grayer  [facility]

## 2015-04-06 NOTE — Progress Notes (Signed)
Pt alert and oriented. Sensation BLE.  Purposeful movement., Medicated for pain with relief. Nausea resolved. Antibiotic administered. Pharmacist, hospital with teach back.

## 2015-04-06 NOTE — Clinical Social Work Placement (Signed)
   CLINICAL SOCIAL WORK PLACEMENT  NOTE  Date:  04/06/2015  Patient Details  Name: Angel Simmons MRN: [id number] Date of Birth: 10/12/1928  Clinical Social Work is seeking post-discharge placement for this patient at the Skilled  Nursing Facility level of care (*CSW will initial, date and re-position this form in  chart as items are completed):  Yes   Patient/family provided with [facility] Clinical Social Work Department's list of facilities offering this level of care within the geographic area requested by the patient (or if unable, by the patient's family).  Yes   Patient/family informed of their freedom to choose among providers that offer the needed level of care, that participate in Medicare, Medicaid or managed care program needed by the patient, have an available bed and are willing to accept the patient.  Yes   Patient/family informed of [facility]'s ownership interest in [facility] and [facility], as well as of the fact that they are under no obligation to receive care at these facilities.  PASRR submitted to EDS on       PASRR number received on       Existing PASRR number confirmed on 04/06/15     FL2 transmitted to all facilities in geographic area requested by pt/family on 04/06/15     FL2 transmitted to all facilities within larger geographic area on       Patient informed that his/her managed care company has contracts with or will negotiate with certain facilities, including the following:        Yes   Patient/family informed of bed offers received.  Patient chooses bed at  [facility] )     Physician recommends and patient chooses bed at      Patient to be transferred to   on  .  Patient to be transferred to facility by       Patient family notified on   of transfer.  Name of family member notified:        PHYSICIAN Please sign FL2     Additional Comment:    _______________________________________________ Haig Prophet,  LCSW 04/06/2015, 6:03 PM

## 2015-04-06 NOTE — Evaluation (Signed)
Physical Therapy Evaluation Patient Details Name: Angel Simmons MRN: [id number] DOB: [birth year] Today's Date: 04/06/2015   History of Present Illness  This patient is an 81 year old female who came to [facility] after a fall suffering a L hip fracture. She recieved a hemiarthroplasty repair.  Clinical Impression  Patient presents s/p fall at home and sustained a L hip fracture. She is very limited in bed mobility, sitting balance, transfers, and ambulation secondary to pain and generalized weakness. She demonstrates significant LLE tenderness with mobility, however she is eventually able to bear weight through her LLE. No knee buckling noted during ambulation, however she maintains flexed in her trunk throughout mobility and sitting secondary to pain. Patient would benefit from skilled rehab to address her mobility deficits.     Follow Up Recommendations SNF    Equipment Recommendations  Rolling walker with 5" wheels    Recommendations for Other Services       Precautions / Restrictions Precautions Precautions: Posterior Hip Restrictions Weight Bearing Restrictions: Yes LLE Weight Bearing: Weight bearing as tolerated      Mobility  Bed Mobility Overal bed mobility: Needs Assistance Bed Mobility: Supine to Sit     Supine to sit: Mod assist     General bed mobility comments: Patient demonstrates good effort through transfer, unable to elevate her trunk but she is able to provide effort through LLE to get to EOB.   Transfers Overall transfer level: Needs assistance Equipment used: Rolling walker (2 wheeled) Transfers: Sit to/from Stand Sit to Stand: Mod assist;Max assist;+2 physical assistance         General transfer comment: Patient does not provide any effort through LLE, trunk is quite flexed throughout attempt.  Ambulation/Gait Ambulation/Gait assistance: Mod assist;Max assist;+2 physical assistance Ambulation Distance (Feet): 2 Feet Assistive device: Rolling walker  (2 wheeled)       General Gait Details: Patient really does not put any weight through LLE initially, she will touch down through for pivoting. She maintains very flexed trunk throughout attempt at ambulation, no knee buckling noted.   Stairs            Wheelchair Mobility    Modified Rankin (Stroke Patients Only)       Balance Overall balance assessment: Needs assistance Sitting-balance support: Bilateral upper extremity supported Sitting balance-Leahy Scale: Poor     Standing balance support: Bilateral upper extremity supported Standing balance-Leahy Scale: Poor                               Pertinent Vitals/Pain Pain Assessment: Faces Faces Pain Scale: Hurts even more Pain Location: L Hip Pain Descriptors / Indicators: Aching Pain Intervention(s): Limited activity within patient's tolerance;Monitored during session;Repositioned;Ice applied;Relaxation    Home Living Family/patient expects to be discharged to:: Skilled nursing facility Living Arrangements: Alone Available Help at Discharge:  (Pt states people could come by but she won't let them b/c she's going to rehab) Type of Home: House Home Access: Stairs to enter   [employer] of Steps: 1 Home Layout: One level Home Equipment: Walker - 4 wheels;Cane - single point Additional Comments: "Handicap commode"     Prior Function Level of Independence: Independent with assistive device(s)         Comments: Patient had been independent with SPC prior to this admission.      Hand Dominance        Extremity/Trunk Assessment   Upper Extremity Assessment: Overall [facility]  for tasks assessed           Lower Extremity Assessment:  (Unable to assess due to pain/confusion in sitting)         Communication   Communication: No difficulties  Cognition Arousal/Alertness: Awake/alert Behavior During Therapy: WFL for tasks assessed/performed Overall Cognitive Status:  (She appears  somewhat confused and has poor short term memory noted)       Memory: Decreased recall of precautions              General Comments      Exercises Total Joint Exercises Ankle Circles/Pumps: AROM;Both;10 reps Gluteal Sets: AROM;10 reps;Both Heel Slides: AAROM;AROM;Both;10 reps Hip ABduction/ADduction: AROM;AAROM;Both;10 reps Straight Leg Raises: AROM;AAROM;Both;10 reps      Assessment/Plan    PT Assessment Patient needs continued PT services  PT Diagnosis Difficulty walking;Generalized weakness   PT Problem List    PT Treatment Interventions DME instruction;Therapeutic activities;Therapeutic exercise;Gait training;Balance training   PT Goals (Current goals can be found in the Care Plan section) Acute Rehab PT Goals Patient Stated Goal: To go to rehab PT Goal Formulation: With patient Time For Goal Achievement: 04/20/15 Potential to Achieve Goals: Fair    Frequency BID   Barriers to discharge        Co-evaluation               End of Session Equipment Utilized During Treatment: Gait belt Activity Tolerance: Patient limited by pain Patient left: in chair;with chair alarm set;with nursing/sitter in room;with call bell/phone within reach Nurse Communication: Mobility status         Time: 6283-1517 PT Time Calculation (min) (ACUTE ONLY): 22 min   Charges:   PT Evaluation $Initial PT Evaluation Tier I: 1 Procedure PT Treatments $Therapeutic Exercise: 8-22 mins   PT G Codes:       Kerin Ransom, PT, DPT    04/06/2015, 12:55 PM

## 2015-04-06 NOTE — Progress Notes (Signed)
Enoxaparin   Patient qualifies for Enoxaparin 30 mg SQ q12h based on CrCl >30 ml/min per policy for anticoagulation of post surgical  patients. Will change to Enoxaparin 30 mg q12 h.   Pharmacy will follow creatinine and CBCs per hospital anticoagulation policy.l   Demetrius Charity, PharmD

## 2015-04-06 NOTE — Progress Notes (Signed)
Pt returned to room from PACU. Temp 95 degrees. Bair Hugger placed on pt.

## 2015-04-07 LAB — CBC
HCT: 29.6 % — ABNORMAL LOW (ref 35.0–47.0)
Hemoglobin: 9.9 g/dL — ABNORMAL LOW (ref 12.0–16.0)
MCH: 28.9 pg (ref 26.0–34.0)
MCHC: 33.3 g/dL (ref 32.0–36.0)
MCV: 86.7 fL (ref 80.0–100.0)
PLATELETS: 175 10*3/uL (ref 150–440)
RBC: 3.42 MIL/uL — AB (ref 3.80–5.20)
RDW: 14 % (ref 11.5–14.5)
WBC: 6.8 10*3/uL (ref 3.6–11.0)

## 2015-04-07 LAB — BASIC METABOLIC PANEL
Anion gap: 7 (ref 5–15)
BUN: 12 mg/dL (ref 6–20)
CO2: 27 mmol/L (ref 22–32)
CREATININE: 0.8 mg/dL (ref 0.44–1.00)
Calcium: 8.4 mg/dL — ABNORMAL LOW (ref 8.9–10.3)
Chloride: 103 mmol/L (ref 101–111)
GFR calc Af Amer: >60 mL/min (ref >60)
GLUCOSE: 122 mg/dL — AB (ref 65–99)
POTASSIUM: 3.3 mmol/L — AB (ref 3.5–5.1)
SODIUM: 137 mmol/L (ref 135–145)

## 2015-04-07 MED ORDER — POTASSIUM CHLORIDE CRYS ER 20 MEQ PO TBCR
40.0000 meq | EXTENDED_RELEASE_TABLET | Freq: Once | ORAL | Status: AC
  Administered 2015-04-07: 40 meq via ORAL
  Filled 2015-04-07: qty 2

## 2015-04-07 NOTE — Progress Notes (Signed)
0630 Received phone call from nursing tech Toni Amend that patient accidentally pulled out her Hemovac. All pieces intact. New dressing applied. Patient tolerating procedure without discomfort. On call MD Rosita Kea  paged awaiting call back.

## 2015-04-07 NOTE — Progress Notes (Signed)
Patient ID: Angel Simmons, female   DOB: 1928-08-07, 81 y.o.   MRN: [id number] [facility] PROGRESS NOTE  PCP: No primary care provider on file.  HPI/Subjective: Patient having some pain in the left hip area. She was asking if she can leave the hospital for a few hours. Otherwise offers no complaints.  Objective: Filed Vitals:   04/07/15 0835 04/07/15 1031  BP: 155/65   Pulse: 80 81  Temp:    Resp:      Filed Weights   04/04/15 1553 04/04/15 1954  Weight: 53.071 kg (117 lb) 60.147 kg (132 lb 9.6 oz)    ROS: Review of Systems  Constitutional: Negative for fever and chills.  Eyes: Negative for blurred vision.  Respiratory: Negative for cough and shortness of breath.   Cardiovascular: Negative for chest pain.  Gastrointestinal: Negative for nausea, vomiting, abdominal pain, diarrhea and constipation.  Genitourinary: Negative for dysuria.  Musculoskeletal: Positive for joint pain.  Neurological: Negative for dizziness and headaches.   Exam: Physical Exam  Constitutional: She is oriented to person, place, and time.  HENT:  Nose: No mucosal edema.  Mouth/Throat: No oropharyngeal exudate or posterior oropharyngeal edema.  Eyes: Conjunctivae, EOM and lids are normal. Pupils are equal, round, and reactive to light.  Neck: No JVD present. Carotid bruit is not present. No edema present. No thyroid mass and no thyromegaly present.  Cardiovascular: S1 normal and S2 normal.  Exam reveals no gallop.   No murmur heard. Pulses:      Dorsalis pedis pulses are 2+ on the right side, and 2+ on the left side.  Respiratory: No respiratory distress. She has no wheezes. She has no rhonchi. She has no rales.  GI: Soft. Bowel sounds are normal. There is no tenderness.  Musculoskeletal:       Right ankle: She exhibits no swelling.       Left ankle: She exhibits no swelling.  Lymphadenopathy:    She has no cervical adenopathy.  Neurological: She is alert and oriented to person,  place, and time. No cranial nerve deficit.  Skin: Skin is warm. No rash noted. Nails show no clubbing.  Psychiatric: She has a normal mood and affect.    Data Reviewed: Basic Metabolic Panel:  Recent Labs Lab 04/04/15 1558 04/05/15 0427 04/06/15 0542 04/07/15 0528  NA 134* 135 137 137  K 3.8 4.0 4.0 3.3*  CL 102 104 106 103  CO2 24 26 25 27   GLUCOSE 96 101* 125* 122*  BUN 17 15 17 12   CREATININE 0.83 0.78 0.74 0.80  CALCIUM 8.9 8.7* 7.9* 8.4*   Liver Function Tests:  Recent Labs Lab 04/04/15 1558  AST 27  ALT 15  ALKPHOS 57  BILITOT 0.4  PROT 6.8  ALBUMIN 3.7   CBC:  Recent Labs Lab 04/04/15 1558 04/05/15 0427 04/06/15 0542 04/07/15 0528  WBC 6.7 7.6 8.1 6.8  HGB 11.5* 11.0* 9.6* 9.9*  HCT 35.4 33.2* 29.2* 29.6*  MCV 87.3 87.0 86.9 86.7  PLT 209 189 165 175     Recent Results (from the past 240 hour(s))  Urine culture     Status: None   Collection Time: 04/04/15  5:50 PM  Result Value Ref Range Status   Specimen Description URINE, RANDOM  Final   Special Requests Normal  Final   Culture NO GROWTH 2 DAYS  Final   Report Status 04/06/2015 FINAL  Final  MRSA PCR Screening     Status: None   Collection Time:  04/04/15  8:15 PM  Result Value Ref Range Status   MRSA by PCR NEGATIVE NEGATIVE Final    Comment:        The GeneXpert MRSA Assay (FDA approved for NASAL specimens only), is one component of a comprehensive MRSA colonization surveillance program. It is not intended to diagnose MRSA infection nor to guide or monitor treatment for MRSA infections.      Studies: Dg Hip Port Unilat With Pelvis 1v Left  04/05/2015  CLINICAL DATA:  Status post left hip hemiarthroplasty. Initial encounter. EXAM: DG HIP (WITH OR WITHOUT PELVIS) 1V PORT LEFT COMPARISON:  Left hip radiograph performed 04/04/2015 FINDINGS: There has been interval placement of a left hip hemiarthroplasty, which appears grossly intact, without evidence of loosening. No new fractures  are seen. Overlying postoperative change and scattered soft tissue air are noted. Screws at the right hip are grossly unchanged in appearance. The visualized bowel gas pattern is grossly unremarkable. IMPRESSION: Left hip hemiarthroplasty appears grossly intact, without evidence of loosening. No new fracture seen. Electronically Signed   By: Roanna Raider M.D.   On: 04/05/2015 22:36    Scheduled Meds: . enoxaparin (LOVENOX) injection  30 mg Subcutaneous Q12H  . feeding supplement (ENSURE ENLIVE)  237 mL Oral Q24H  . ferrous sulfate  325 mg Oral BID WC  . lisinopril  20 mg Oral Daily  . metoCLOPramide  10 mg Oral TID AC & HS  . senna-docusate  1 tablet Oral BID    Assessment/Plan:  1. Essential hypertension- continue low-dose lisinopril 2. Rheumatoid arthritis, spinal stenosis with chronic low back pain- continue pain control medications 3. Left hip fracture with drain- pain management with when necessary medication. Further management as per orthopedic surgery. Likely out to rehabilitation tomorrow. 4. Postoperative anemia- hemoglobin stabilized. 5. Hypokalemia replace potassium orally.  Code Status:     Code Status Orders        Start     Ordered   04/05/15 2304  Full code   Continuous     04/05/15 2303    Advance Directive Documentation        Most Recent Value   Type of Advance Directive  Healthcare Power of Attorney, Living will   Pre-existing out of facility DNR order (yellow form or pink MOST form)     "MOST" Form in Place?       Disposition Plan: Likely rehabilitation tomorrow  Consultants:  Orthopedic surgery  Time spent: 20 minutes  Alford Highland  [facility]

## 2015-04-07 NOTE — Progress Notes (Signed)
Physical Therapy Treatment Patient Details Name: Angel Simmons MRN: [id number] DOB: 1928-06-01 Today's Date: 04/07/2015    History of Present Illness This patient is an 81 year old female who came to [facility] after a fall suffering a L hip fracture. She recieved a hemiarthroplasty repair.    PT Comments    Patient demonstrates significantly improved cognitive status and participation in this session. She is able to ambulate roughly 10 feet with RW and step to pattern once knee immobilizer repositioned and tightened. Patient fatigues quickly and begins to flex her trunk and demonstrates poor mechanics (ambulating too close to front of walker). She is progressing well towards her mobility goals, no adverse reaction to ambulation during this session. She does continue to demonstrate functional LLE weakness and has one episode where her L knee buckles while attempting to ambulate from chair. No further loss of balance once knee immobilizer is repositioned and tightened.   Follow Up Recommendations  SNF     Equipment Recommendations  Rolling walker with 5" wheels    Recommendations for Other Services       Precautions / Restrictions Precautions Precautions: Posterior Hip Restrictions Weight Bearing Restrictions: Yes LLE Weight Bearing: Weight bearing as tolerated    Mobility  Bed Mobility Overal bed mobility: Needs Assistance Bed Mobility: Supine to Sit     Supine to sit: Min assist     General bed mobility comments: Pt uses bed rails to assist in transfer to EOB, requires assistance for elevating her trunk and bringing LLE to EOB.   Transfers Overall transfer level: Needs assistance Equipment used: Rolling walker (2 wheeled) Transfers: Sit to/from Stand Sit to Stand: Min guard;Min assist         General transfer comment: Patient requires cuing for hand placement, she is able to transfer slowly, but no loss of balance.  Ambulation/Gait Ambulation/Gait assistance: Min  guard;Min assist Ambulation Distance (Feet): 10 Feet Assistive device: Rolling walker (2 wheeled) Gait Pattern/deviations: Step-to pattern;Decreased step length - right;Decreased step length - left;Decreased stance time - left;Trunk flexed   Gait velocity interpretation: <1.8 ft/sec, indicative of risk for recurrent falls General Gait Details: Patient demonstrates step to pattern with knee immobilizer in place, no loss of balance noted though significantly flexed trunk. She fatigues quickly with ambulation, beginning to take shorter steps and increased trunk flexion noted.    Stairs            Wheelchair Mobility    Modified Rankin (Stroke Patients Only)       Balance Overall balance assessment: Needs assistance Sitting-balance support: Bilateral upper extremity supported Sitting balance-Leahy Scale: Fair   Postural control: Posterior lean   Standing balance-Leahy Scale: Fair Standing balance comment: Once KI tightly donned, no loss of balance with ambulation. Prior to [facility] being repositioned, on first attempt to ambulate her LLE flexed and was unable to maintain in standing.                     Cognition Arousal/Alertness: Awake/alert Behavior During Therapy: WFL for tasks assessed/performed Overall Cognitive Status: Within Functional Limits for tasks assessed (Much more cognitively alert and oriented in this session)                      Exercises Total Joint Exercises Ankle Circles/Pumps: AROM;Both;10 reps Short Arc Quad: AROM;Left;10 reps Heel Slides: AROM;AAROM;Both;10 reps Hip ABduction/ADduction: AROM;AAROM;Both;10 reps Straight Leg Raises: AROM;AAROM;Both;10 reps    General Comments  Pertinent Vitals/Pain Pain Assessment: Faces Faces Pain Scale: Hurts a little bit Pain Location: L Hip Pain Descriptors / Indicators: Aching Pain Intervention(s): Limited activity within patient's tolerance;Monitored during session;Premedicated before  session;Repositioned    Home Living                      Prior Function            PT Goals (current goals can now be found in the care plan section) Acute Rehab PT Goals Patient Stated Goal: To go to rehab PT Goal Formulation: With patient Time For Goal Achievement: 04/20/15 Potential to Achieve Goals: Good Progress towards PT goals: Progressing toward goals    Frequency  BID    PT Plan Current plan remains appropriate    Co-evaluation             End of Session Equipment Utilized During Treatment: Gait belt Activity Tolerance: Patient tolerated treatment well Patient left: in chair;with chair alarm set;with call bell/phone within reach     Time: 9758-8325 PT Time Calculation (min) (ACUTE ONLY): 28 min  Charges:  $Gait Training: 8-22 mins $Therapeutic Exercise: 8-22 mins                    G Codes:      Kerin Ransom, PT, DPT    04/07/2015, 11:35 AM

## 2015-04-07 NOTE — Progress Notes (Signed)
MD Rosita Kea  returned phone call at [phone] , no new orders giving.

## 2015-04-07 NOTE — Progress Notes (Addendum)
   Subjective: 2 Days Post-Op Procedure(s) (LRB): ARTHROPLASTY BIPOLAR HIP (HEMIARTHROPLASTY) (Left) Patient reports pain as 5 on 0-10 scale and mild.   Patient is well, and has had no acute complaints or problems We will continue therapy today.  Plan is to go Rehab after hospital stay. no nausea and no vomiting. Patient denies any chest pains or shortness of breath.  Objective: Vital signs in last 24 hours: Temp:  [98.4 F (36.9 C)-100.4 F (38 C)] 98.4 F (36.9 C) (11/24 0434) Pulse Rate:  [80-83] 80 (11/24 0434) Resp:  [18] 18 (11/24 0434) BP: (146-150)/(59-73) 147/65 mmHg (11/24 0434) SpO2:  [94 %-97 %] 96 % (11/24 0434)  Intake/Output from previous day: 11/23 0701 - 11/24 0700 In: 1486.7 [P.O.:360; I.V.:1126.7] Out: 360 [Urine:250; Drains:110] Intake/Output this shift:     Recent Labs  04/04/15 1558 04/05/15 0427 04/06/15 0542 04/07/15 0528  HGB 11.5* 11.0* 9.6* 9.9*    Recent Labs  04/06/15 0542 04/07/15 0528  WBC 8.1 6.8  RBC 3.36* 3.42*  HCT 29.2* 29.6*  PLT 165 175    Recent Labs  04/06/15 0542 04/07/15 0528  NA 137 137  K 4.0 3.3*  CL 106 103  CO2 25 27  BUN 17 12  CREATININE 0.74 0.80  GLUCOSE 125* 122*  CALCIUM 7.9* 8.4*    Recent Labs  04/04/15 1558  INR 0.94    EXAM General - Patient is Alert, Appropriate and Oriented Extremity - Neurologically intact Neurovascular intact Sensation intact distally Intact pulses distally Dorsiflexion/Plantar flexion intact  No soft tissue swelling Dressing - dressing C/D/I, hemovac removed. Motor Function - intact, moving foot and toes well on exam.  Good strength against resistance of dorsiflexion and plantar flexion  Past Medical History  Diagnosis Date  . Hip fx (HCC)   . Hypertension   . Chronic back pain   . Rheumatoid arthritis (HCC)   . Spinal stenosis     Assessment/Plan: 2 Days Post-Op Procedure(s) (LRB): ARTHROPLASTY BIPOLAR HIP (HEMIARTHROPLASTY) (Left) Active  Problems:   Hip fracture (HCC)  Estimated body mass index is 22.75 kg/(m^2) as calculated from the following:   Height as of this encounter: 5\' 4"  (1.626 m).   Weight as of this encounter: 60.147 kg (132 lb 9.6 oz). Advance diet Up with therapy D/C IV fluids Discharge to SNF on fri Needs BM before discharge Remove staples on 04/19/15 Follow up with [facility] in 6 weeks  Labs: reviewed DVT Prophylaxis - Lovenox, Foot Pumps and TED hose Weight-Bearing as tolerated to left leg D/C O2 and Pulse OX and try on Room Air Needs to start working on a bowel movement Staples will need to be removed 14 days post op Will need to f/u in [facility] in 6 weeks with dr. [place]. Change dressing as needed  Ernest Pine PA [facility] 04/07/2015, 8:26 AM

## 2015-04-07 NOTE — Discharge Instructions (Signed)
HIP HEMIARTHROPLASTY  POSTOPERATIVE DIRECTIONS  Hip Rehabilitation, Guidelines Following Surgery  The results of a hip operation are greatly improved after range of motion and muscle strengthening exercises. Follow all safety measures which are given to protect your hip. If any of these exercises cause increased pain or swelling in your joint, decrease the amount until you are comfortable again. Then slowly increase the exercises. Call your caregiver if you have problems or questions.   HOME CARE INSTRUCTIONS  Remove items at home which could result in a fall. This includes throw rugs or furniture in walking pathways.   ICE to the affected hip every three hours for 30 minutes at a time and then as needed for pain and swelling.  Continue to use ice on the hip for pain and swelling from surgery. You may notice swelling that will progress down to the foot and ankle.  This is normal after surgery.  Elevate the leg when you are not up walking on it.    Continue to use the breathing machine which will help keep your temperature down.  It is common for your temperature to cycle up and down following surgery, especially at night when you are not up moving around and exerting yourself.  The breathing machine keeps your lungs expanded and your temperature down.  Remove staples and apply steri strips on 04/19/15  DIET You may resume your previous home diet once your are discharged from the hospital.  DRESSING / WOUND CARE / SHOWERING You may start showering once staples have been removed. Change dressing as needed.    ACTIVITY Walk with your walker as instructed. Use walker as long as suggested by your caregivers. Avoid periods of inactivity such as sitting longer than an hour when not asleep. This helps prevent blood clots.  You may resume a sexual relationship in one month or when given the OK by your doctor.  You may return to work once you are cleared by your doctor.  Do not drive a car for 6  weeks or until released by you surgeon.  Do not drive while taking narcotics.  WEIGHT BEARING Weight bearing as tolerated  POSTOPERATIVE CONSTIPATION PROTOCOL Constipation - defined medically as fewer than three stools per week and severe constipation as less than one stool per week.  One of the most common issues patients have following surgery is constipation.  Even if you have a regular bowel pattern at home, your normal regimen is likely to be disrupted due to multiple reasons following surgery.  Combination of anesthesia, postoperative narcotics, change in appetite and fluid intake all can affect your bowels.  In order to avoid complications following surgery, here are some recommendations in order to help you during your recovery period.  Colace (docusate) - Pick up an over-the-counter form of Colace or another stool softener and take twice a day as long as you are requiring postoperative pain medications.  Take with a full glass of water daily.  If you experience loose stools or diarrhea, hold the colace until you stool forms back up.  If your symptoms do not get better within 1 week or if they get worse, check with your doctor.  Dulcolax (bisacodyl) - Pick up over-the-counter and take as directed by the product packaging as needed to assist with the movement of your bowels.  Take with a full glass of water.  Use this product as needed if not relieved by Colace only.   MiraLax (polyethylene glycol) - Pick up over-the-counter to  have on hand.  MiraLax is a solution that will increase the amount of water in your bowels to assist with bowel movements.  Take as directed and can mix with a glass of water, juice, soda, coffee, or tea.  Take if you go more than two days without a movement. Do not use MiraLax more than once per day. Call your doctor if you are still constipated or irregular after using this medication for 7 days in a row.  If you continue to have problems with postoperative  constipation, please contact the office for further assistance and recommendations.  If you experience "the worst abdominal pain ever" or develop nausea or vomiting, please contact the office immediatly for further recommendations for treatment.  ITCHING  If you experience itching with your medications, try taking only a single pain pill, or even half a pain pill at a time.  You can also use Benadryl over the counter for itching or also to help with sleep.   TED HOSE STOCKINGS Wear the elastic stockings on both legs for six weeks following surgery during the day but you may remove then at night for sleeping.  MEDICATIONS See your medication summary on the After Visit Summary that the nursing staff will review with you prior to discharge.  You may have some home medications which will be placed on hold until you complete the course of blood thinner medication.  It is important for you to complete the blood thinner medication as prescribed by your surgeon.  Continue your approved medications as instructed at time of discharge.  PRECAUTIONS If you experience chest pain or shortness of breath - call 911 immediately for transfer to the hospital emergency department.  If you develop a fever greater that 101 F, purulent drainage from wound, increased redness or drainage from wound, foul odor from the wound/dressing, or calf pain - CONTACT YOUR SURGEON.                                                   FOLLOW-UP APPOINTMENTS Make sure you keep all of your appointments after your operation with your surgeon and caregivers. You should call the office at the above phone number and make an appointment for approximately two weeks after the date of your surgery or on the date instructed by your surgeon outlined in the "After Visit Summary".  RANGE OF MOTION AND STRENGTHENING EXERCISES  These exercises are designed to help you keep full movement of your hip joint. Follow your caregiver's or physical  therapist's instructions. Perform all exercises about fifteen times, three times per day or as directed. Exercise both hips, even if you have had only one joint replacement. These exercises can be done on a training (exercise) mat, on the floor, on a table or on a bed. Use whatever works the best and is most comfortable for you. Use music or television while you are exercising so that the exercises are a pleasant break in your day. This will make your life better with the exercises acting as a break in routine you can look forward to.  Lying on your back, slowly slide your foot toward your buttocks, raising your knee up off the floor. Then slowly slide your foot back down until your leg is straight again.  Lying on your back spread your legs as far apart as you can  without causing discomfort.  Lying on your side, raise your upper leg and foot straight up from the floor as far as is comfortable. Slowly lower the leg and repeat.  Lying on your back, tighten up the muscle in the front of your thigh (quadriceps muscles). You can do this by keeping your leg straight and trying to raise your heel off the floor. This helps strengthen the largest muscle supporting your knee.  Lying on your back, tighten up the muscles of your buttocks both with the legs straight and with the knee bent at a comfortable angle while keeping your heel on the floor.      IF YOU ARE TRANSFERRED TO A SKILLED REHAB FACILITY If the patient is transferred to a skilled rehab facility following release from the hospital, a list of the current medications will be sent to the facility for the patient to continue.  When discharged from the skilled rehab facility, please have the facility set up the patient's Home Health Physical Therapy prior to being released. Also, the skilled facility will be responsible for providing the patient with their medications at time of release from the facility to include their pain medication, the muscle  relaxants, and their blood thinner medication. If the patient is still at the rehab facility at time of the two week follow up appointment, the skilled rehab facility will also need to assist the patient in arranging follow up appointment in our office and any transportation needs.  MAKE SURE YOU:  Understand these instructions.  Get help right away if you are not doing well or get worse.              Remove staples and apply steri strips on 04/19/15   Pick up stool softner and laxative for home use following surgery while on pain medications. Continue to use ice for pain and swelling after surgery. Do not use any lotions or creams on the incision until instructed by your surgeon.

## 2015-04-07 NOTE — Progress Notes (Signed)
Patient cooperative with care this shift.  Up to chair with one assist.  Plan to discharge to [facility] 04/08/2015.  Laxatives and stool softeners given with no positive results.  Pain managed with PO pain medication.

## 2015-04-08 ENCOUNTER — Encounter
Admission: RE | Admit: 2015-04-08 | Discharge: 2015-04-08 | Disposition: A | Discharge status: Home / Self Care | Payer: Medicare Other | Source: Ambulatory Visit | Attending: Internal Medicine | Admitting: Internal Medicine

## 2015-04-08 LAB — CBC
HEMATOCRIT: 27.1 % — AB (ref 35.0–47.0)
HEMOGLOBIN: 9.1 g/dL — AB (ref 12.0–16.0)
MCH: 28.9 pg (ref 26.0–34.0)
MCHC: 33.5 g/dL (ref 32.0–36.0)
MCV: 86.3 fL (ref 80.0–100.0)
Platelets: 179 10*3/uL (ref 150–440)
RBC: 3.15 MIL/uL — AB (ref 3.80–5.20)
RDW: 14 % (ref 11.5–14.5)
WBC: 6.2 10*3/uL (ref 3.6–11.0)

## 2015-04-08 MED ORDER — ENOXAPARIN SODIUM 40 MG/0.4ML ~~LOC~~ SOLN: SUBCUTANEOUS | Status: DC

## 2015-04-08 MED ORDER — LACTULOSE 10 GM/15ML PO SOLN: 30.0000 g | Freq: Every day | ORAL | Status: DC | PRN

## 2015-04-08 MED ORDER — FERROUS SULFATE 325 (65 FE) MG PO TABS: 325.0000 mg | ORAL_TABLET | Freq: Two times a day (BID) | ORAL | Status: DC

## 2015-04-08 MED ORDER — OXYCODONE HCL 5 MG PO TABS: 2.5000 mg | ORAL_TABLET | Freq: Four times a day (QID) | ORAL | Status: DC | PRN

## 2015-04-08 MED ORDER — ENSURE ENLIVE PO LIQD: 237.0000 mL | ORAL | Status: DC

## 2015-04-08 MED ORDER — SENNOSIDES-DOCUSATE SODIUM 8.6-50 MG PO TABS: 1.0000 | ORAL_TABLET | Freq: Two times a day (BID) | ORAL | Status: DC

## 2015-04-08 NOTE — Progress Notes (Signed)
Physical Therapy Treatment Patient Details Name: Angel Simmons MRN: [id number] DOB: 1928-05-20 Today's Date: 04/08/2015    History of Present Illness This patient is an 81 year old female who came to [facility] after a fall suffering a L hip fracture. She recieved a hemiarthroplasty repair.    PT Comments    Patient demonstrates significantly improved gait speed, though increased trunk flexion continues to be noted. Patient is able to navigate through the room without loss of balance, though given her gait mechanics she remains as a very high falls risk. She attempts to use the rest-room on multiple occassions and was unsuccessful. Skilled PT services continue to be indicated to address the above deficits.  Follow Up Recommendations  SNF     Equipment Recommendations  Rolling walker with 5" wheels    Recommendations for Other Services       Precautions / Restrictions Precautions Precautions: Posterior Hip Restrictions Weight Bearing Restrictions: Yes LLE Weight Bearing: Weight bearing as tolerated    Mobility  Bed Mobility               General bed mobility comments: Patient received on bedside commode.   Transfers Overall transfer level: Needs assistance Equipment used: Rolling walker (2 wheeled) Transfers: Sit to/from Stand Sit to Stand: Min guard         General transfer comment: Patient requires cuing for hand placement, no requirement for physical assistance.   Ambulation/Gait Ambulation/Gait assistance: Min guard Ambulation Distance (Feet): 50 Feet Assistive device: Rolling walker (2 wheeled) Gait Pattern/deviations: Step-through pattern;Decreased step length - right;Decreased step length - left;Trunk flexed   Gait velocity interpretation: Below normal speed for age/gender General Gait Details: Patient continues to demonstrate very flexed trunk posture, requires constant cuing to bring her trunk more upright, per daughter this is her baseline. She  demosntrates significantly improved gait speed and does not use the knee immobilizer in this session.    Stairs            Wheelchair Mobility    Modified Rankin (Stroke Patients Only)       Balance Overall balance assessment: Needs assistance Sitting-balance support: Bilateral upper extremity supported Sitting balance-Leahy Scale: Fair Sitting balance - Comments: No balance deficits noted    Standing balance support: Bilateral upper extremity supported Standing balance-Leahy Scale: Fair Standing balance comment: Patient does not have any incidents of knee buckling without KI today. Continues to have poor mechanics with RW and requires cuing to maintain upright posture.                     Cognition Arousal/Alertness: Awake/alert Behavior During Therapy: WFL for tasks assessed/performed Overall Cognitive Status: Within Functional Limits for tasks assessed (Appears to be at her baseline cognitively in this session)       Memory: Decreased recall of precautions              Exercises      General Comments        Pertinent Vitals/Pain      Home Living                      Prior Function            PT Goals (current goals can now be found in the care plan section) Acute Rehab PT Goals Patient Stated Goal: To go to rehab PT Goal Formulation: With patient Time For Goal Achievement: 04/20/15 Potential to Achieve Goals: Good Progress towards PT goals:  Progressing toward goals    Frequency  BID    PT Plan Current plan remains appropriate    Co-evaluation             End of Session Equipment Utilized During Treatment: Gait belt Activity Tolerance: Patient tolerated treatment well Patient left: in chair;with chair alarm set;with call bell/phone within reach;with family/visitor present     Time: 0902-0929 PT Time Calculation (min) (ACUTE ONLY): 27 min  Charges:  $Gait Training: 8-22 mins $Therapeutic Activity: 8-22  mins                    G Codes:      Kerin Ransom, PT, DPT    04/08/2015, 3:17 PM

## 2015-04-08 NOTE — Progress Notes (Signed)
Patient is medically stable for D/C to [facility] today. Per Kim admissions coordinator at [facility] patient is going to semi-private room 210-B. RN will call report at [phone] and arrange EMS for transport. RN has agreed to call patient's daughter Olegario Messier when EMS arrives. Clinical Child psychotherapist (CSW) sent D/C Summary, D/C packet and FL2 to [employer] via [employer]. EMS form and prescriptions are on chart. Patient's daughter Olegario Messier was at bedside and aware of above. Please reconsult if future social work needs arise. CSW signing off.   Jetta Lout, LCSWA [phone]

## 2015-04-08 NOTE — Discharge Summary (Signed)
[facility]   PATIENT NAME: Angel Simmons    MR#:  [id number]  DATE OF BIRTH:  20-Dec-1928  DATE OF ADMISSION:  04/04/2015 ADMITTING PHYSICIAN: Enid Baas, MD  DATE OF DISCHARGE: 04/08/2015  PRIMARY CARE PHYSICIAN: Dr. Corky Downs   ADMISSION DIAGNOSIS:  Hip fracture, left, closed, initial encounter (HCC) [S72.002A] Urinary tract infection without hematuria, site unspecified [N39.0]  DISCHARGE DIAGNOSIS:  Active Problems:   Hip fracture (HCC)   SECONDARY DIAGNOSIS:   Past Medical History  Diagnosis Date  . Hip fx (HCC)   . Hypertension   . Chronic back pain   . Rheumatoid arthritis (HCC)   . Spinal stenosis     HOSPITAL COURSE:   1. Left fracture requiring operative repair. Please see operative report by Dr. Ernest Pine. On day 3 postoperatively, the patient was doing well enough for discharge out to rehabilitation. Pain control with her oxycodone. 2. Essential hypertension- continue lisinopril 3. Chronic back pain and rheumatoid arthritis and spinal stenosis- patient on oxycodone 4. The patient did not have a urinary tract infection 5. Postoperative anemia- continue ferrous sulfate and check a hemoglobin in 2 weeks  DISCHARGE CONDITIONS:   Satisfactory  CONSULTS OBTAINED:  Treatment Team:  Donato Heinz, MD  DRUG ALLERGIES:   Allergies  Allergen Reactions  . Tylenol [Acetaminophen] Other (See Comments)    Reaction:  Headaches     DISCHARGE MEDICATIONS:   Current Discharge Medication List    START taking these medications   Details  enoxaparin (LOVENOX) 40 MG/0.4ML injection For 14 days then stop Qty: 14 Syringe, Refills: 0    feeding supplement, ENSURE ENLIVE, (ENSURE ENLIVE) LIQD Take 237 mLs by mouth daily. Qty: 237 mL, Refills: 12    ferrous sulfate 325 (65 FE) MG tablet Take 1 tablet (325 mg total) by mouth 2 (two) times daily with a meal. Qty: 60 tablet, Refills: 0    senna-docusate  (SENOKOT-S) 8.6-50 MG tablet Take 1 tablet by mouth 2 (two) times daily. Qty: 60 tablet, Refills: 0      CONTINUE these medications which have CHANGED   Details  oxyCODONE (OXY IR/ROXICODONE) 5 MG immediate release tablet Take 0.5 tablets (2.5 mg total) by mouth every 6 (six) hours as needed for moderate pain or severe pain. Qty: 30 tablet, Refills: 0      CONTINUE these medications which have NOT CHANGED   Details  lisinopril (PRINIVIL,ZESTRIL) 20 MG tablet Take 20 mg by mouth daily.      STOP taking these medications     naproxen sodium (ANAPROX) 220 MG tablet          DISCHARGE INSTRUCTIONS:   Follow-up with Dr. rehabilitation 1 day Follow-up with Dr. Ernest Pine postoperatively  If you experience worsening of your admission symptoms, develop shortness of breath, life threatening emergency, suicidal or homicidal thoughts you must seek medical attention immediately by calling 911 or calling your MD immediately  if symptoms less severe.  You Must read complete instructions/literature along with all the possible adverse reactions/side effects for all the Medicines you take and that have been prescribed to you. Take any new Medicines after you have completely understood and accept all the possible adverse reactions/side effects.   Please note  You were cared for by a hospitalist during your hospital stay. If you have any questions about your discharge medications or the care you received while you were in the hospital after you are discharged, you can call the unit and  asked to speak with the hospitalist on call if the hospitalist that took care of you is not available. Once you are discharged, your primary care physician will handle any further medical issues. Please note that NO REFILLS for any discharge medications will be authorized once you are discharged, as it is imperative that you return to your primary care physician (or establish a relationship with a primary care physician if  you do not have one) for your aftercare needs so that they can reassess your need for medications and monitor your lab values.    Today   CHIEF COMPLAINT:   Chief Complaint  Patient presents with  . Fall  . Hip Pain    HISTORY OF PRESENT ILLNESS:  Angel Simmons  is a 81 y.o. female with a known history of hypertension. She presents after a fall and found to have a left hip fracture   VITAL SIGNS:  Blood pressure 135/64, pulse 77, temperature 98.3 F (36.8 C), temperature source Oral, resp. rate 16, height 5\' 4"  (1.626 m), weight 60.147 kg (132 lb 9.6 oz), SpO2 94 %.    PHYSICAL EXAMINATION:  GENERAL:  81 y.o.-year-old patient lying in the bed with no acute distress.  EYES: Pupils equal, round, reactive to light and accommodation. No scleral icterus. Extraocular muscles intact.  HEENT: Head atraumatic, normocephalic. Oropharynx and nasopharynx clear.  NECK:  Supple, no jugular venous distention. No thyroid enlargement, no tenderness.  LUNGS: Normal breath sounds bilaterally, no wheezing, rales,rhonchi or crepitation. No use of accessory muscles of respiration.  CARDIOVASCULAR: S1, S2 normal. No murmurs, rubs, or gallops.  ABDOMEN: Soft, non-tender, non-distended. Bowel sounds present. No organomegaly or mass.  EXTREMITIES: No pedal edema, cyanosis, or clubbing.  NEUROLOGIC: Cranial nerves II through XII are intact. PSYCHIATRIC: The patient is alert and oriented x 3.  SKIN: No obvious rash, lesion, or ulcer.   DATA REVIEW:   CBC  Recent Labs Lab 04/08/15 0432  WBC 6.2  HGB 9.1*  HCT 27.1*  PLT 179    Chemistries   Recent Labs Lab 04/04/15 1558  04/07/15 0528  NA 134*  < > 137  K 3.8  < > 3.3*  CL 102  < > 103  CO2 24  < > 27  GLUCOSE 96  < > 122*  BUN 17  < > 12  CREATININE 0.83  < > 0.80  CALCIUM 8.9  < > 8.4*  AST 27  --   --   ALT 15  --   --   ALKPHOS 57  --   --   BILITOT 0.4  --   --   < > = values in this interval not displayed.   Microbiology  Results  Results for orders placed or performed during the hospital encounter of 04/04/15  Urine culture     Status: None   Collection Time: 04/04/15  5:50 PM  Result Value Ref Range Status   Specimen Description URINE, RANDOM  Final   Special Requests Normal  Final   Culture NO GROWTH 2 DAYS  Final   Report Status 04/06/2015 FINAL  Final  MRSA PCR Screening     Status: None   Collection Time: 04/04/15  8:15 PM  Result Value Ref Range Status   MRSA by PCR NEGATIVE NEGATIVE Final    Comment:        The GeneXpert MRSA Assay (FDA approved for NASAL specimens only), is one component of a comprehensive MRSA colonization surveillance program. It is not  intended to diagnose MRSA infection nor to guide or monitor treatment for MRSA infections.     Management plans discussed with the patient, and she is in agreement.  CODE STATUS:     Code Status Orders        Start     Ordered   04/05/15 2304  Full code   Continuous     04/05/15 2303    Advance Directive Documentation        Most Recent Value   Type of Advance Directive  Healthcare Power of Attorney, Living will   Pre-existing out of facility DNR order (yellow form or pink MOST form)     "MOST" Form in Place?        TOTAL TIME TAKING CARE OF THIS PATIENT: 35 minutes.    Alford Highland M.D on 04/08/2015 at 7:36 AM  Between 7am to 6pm - Pager - [phone]  After 6pm go to [email or web address] - password [email or web address] [facility]  [facility]  Office  [phone]  CC: Primary care physician; Dr. Corky Downs

## 2015-04-08 NOTE — Clinical Social Work Placement (Signed)
   CLINICAL SOCIAL WORK PLACEMENT  NOTE  Date:  04/08/2015  Patient Details  Name: Angel Simmons MRN: [id number] Date of Birth: 1929-04-23  Clinical Social Work is seeking post-discharge placement for this patient at the Skilled  Nursing Facility level of care (*CSW will initial, date and re-position this form in  chart as items are completed):  Yes   Patient/family provided with [place] Clinical Social Work Department's list of facilities offering this level of care within the geographic area requested by the patient (or if unable, by the patient's family).  Yes   Patient/family informed of their freedom to choose among providers that offer the needed level of care, that participate in Medicare, Medicaid or managed care program needed by the patient, have an available bed and are willing to accept the patient.  Yes   Patient/family informed of [place]'s ownership interest in [facility] and [facility], as well as of the fact that they are under no obligation to receive care at these facilities.  PASRR submitted to EDS on       PASRR number received on       Existing PASRR number confirmed on 04/06/15     FL2 transmitted to all facilities in geographic area requested by pt/family on 04/06/15     FL2 transmitted to all facilities within larger geographic area on       Patient informed that his/her managed care company has contracts with or will negotiate with certain facilities, including the following:        Yes   Patient/family informed of bed offers received.  Patient chooses bed at  [facility] )     Physician recommends and patient chooses bed at      Patient to be transferred to  [facility] ) on 04/08/15.  Patient to be transferred to facility by  [facility] )     Patient family notified on 04/08/15 of transfer.  Name of family member notified:   (Daughter Olegario Messier at bedside and aware of D/C today. )     PHYSICIAN       Additional  Comment:    _______________________________________________ Haig Prophet, LCSW 04/08/2015, 9:31 AM

## 2015-04-08 NOTE — Progress Notes (Signed)
  Subjective: 3 Days Post-Op Procedure(s) (LRB): ARTHROPLASTY BIPOLAR HIP (HEMIARTHROPLASTY) (Left) Patient reports pain as mild.   Patient is well, and has had no acute complaints or problems Plan is to go Rehab after hospital stay. Negative for chest pain and shortness of breath Fever: no Gastrointestinal:Negative for nausea and vomiting  Objective: Vital signs in last 24 hours: Temp:  [97.3 F (36.3 C)-98.9 F (37.2 C)] 98.3 F (36.8 C) (11/25 0731) Pulse Rate:  [73-84] 77 (11/25 0731) Resp:  [16-18] 16 (11/25 0731) BP: (118-160)/(61-88) 135/64 mmHg (11/25 0731) SpO2:  [94 %-98 %] 94 % (11/25 0731)  Intake/Output from previous day:  Intake/Output Summary (Last 24 hours) at 04/08/15 0808 Last data filed at 04/08/15 0354  Gross per 24 hour  Intake    480 ml  Output    600 ml  Net   -120 ml    Intake/Output this shift:    Labs:  Recent Labs  04/06/15 0542 04/07/15 0528 04/08/15 0432  HGB 9.6* 9.9* 9.1*    Recent Labs  04/07/15 0528 04/08/15 0432  WBC 6.8 6.2  RBC 3.42* 3.15*  HCT 29.6* 27.1*  PLT 175 179    Recent Labs  04/06/15 0542 04/07/15 0528  NA 137 137  K 4.0 3.3*  CL 106 103  CO2 25 27  BUN 17 12  CREATININE 0.74 0.80  GLUCOSE 125* 122*  CALCIUM 7.9* 8.4*   No results for input(s): LABPT, INR in the last 72 hours.   EXAM General - Patient is Alert, Appropriate and Oriented Extremity - Neurologically intact ABD soft Dorsiflexion/Plantar flexion intact Incision: dressing C/D/I No cellulitis present Dressing/Incision - clean, dry, no drainage Motor Function - intact, moving foot and toes well on exam.   Abdomen soft on exam, normal BS.  Past Medical History  Diagnosis Date  . Hip fx (HCC)   . Hypertension   . Chronic back pain   . Rheumatoid arthritis (HCC)   . Spinal stenosis     Assessment/Plan: 3 Days Post-Op Procedure(s) (LRB): ARTHROPLASTY BIPOLAR HIP (HEMIARTHROPLASTY) (Left) Active Problems:   Hip fracture  (HCC)  Estimated body mass index is 22.75 kg/(m^2) as calculated from the following:   Height as of this encounter: 5\' 4"  (1.626 m).   Weight as of this encounter: 60.147 kg (132 lb 9.6 oz). Advance diet Up with therapy  Plan on discharge to SNF today pending BM. Remove staples on 04/19/15 Follow-up with [facility] in 6 weeks.  DVT Prophylaxis - Lovenox, Foot Pumps and TED hose Weight-Bearing as tolerated to left leg Staples to be removed 14 days post-op F/U in [facility] in 6 weeks with Dr. [facility]. Change dressing as needed  J. Ernest Pine, PA-C [facility] Orthopaedic Surgery 04/08/2015, 8:08 AM

## 2015-04-08 NOTE — Care Management Important Message (Signed)
Important Message  Patient Details  Name: Angel Simmons MRN: [id number] Date of Birth: 12/31/1928   Medicare Important Message Given:  Yes    Davinci Glotfelty A, RN 04/08/2015, 9:23 AM

## 2015-04-08 NOTE — Progress Notes (Signed)
Called report to [place] spoke with [place].  Patient going to room 210-B.  Patient daughter aware that I am calling for transportation.  EMS transport advised no convalescent transportation running today and they will come transport patient when truck is available.

## 2015-04-10 LAB — SURGICAL PATHOLOGY

## 2015-04-14 ENCOUNTER — Encounter
Admission: RE | Admit: 2015-04-14 | Discharge: 2015-04-14 | Disposition: A | Discharge status: Home / Self Care | Payer: Medicare Other | Source: Ambulatory Visit | Attending: Internal Medicine | Admitting: Internal Medicine

## 2015-04-21 LAB — COMPREHENSIVE METABOLIC PANEL
ALK PHOS: 58 U/L (ref 38–126)
ALT: 13 U/L — AB (ref 14–54)
AST: 18 U/L (ref 15–41)
Albumin: 3 g/dL — ABNORMAL LOW (ref 3.5–5.0)
Anion gap: 5 (ref 5–15)
BUN: 17 mg/dL (ref 6–20)
CALCIUM: 8.7 mg/dL — AB (ref 8.9–10.3)
CHLORIDE: 103 mmol/L (ref 101–111)
CO2: 28 mmol/L (ref 22–32)
CREATININE: 0.8 mg/dL (ref 0.44–1.00)
GFR calc Af Amer: >60 mL/min (ref >60)
GFR calc non Af Amer: >60 mL/min (ref >60)
GLUCOSE: 91 mg/dL (ref 65–99)
Potassium: 4 mmol/L (ref 3.5–5.1)
SODIUM: 136 mmol/L (ref 135–145)
Total Bilirubin: 0.4 mg/dL (ref 0.3–1.2)
Total Protein: 5.9 g/dL — ABNORMAL LOW (ref 6.5–8.1)

## 2015-04-21 LAB — CBC WITH DIFFERENTIAL/PLATELET
BASOS PCT: 1 %
Basophils Absolute: 0.1 10*3/uL (ref 0–0.1)
EOS ABS: 0.1 10*3/uL (ref 0–0.7)
EOS PCT: 2 %
HEMATOCRIT: 29.4 % — AB (ref 35.0–47.0)
HEMOGLOBIN: 10 g/dL — AB (ref 12.0–16.0)
LYMPHS ABS: 1.5 10*3/uL (ref 1.0–3.6)
Lymphocytes Relative: 31 %
MCH: 29.7 pg (ref 26.0–34.0)
MCHC: 33.9 g/dL (ref 32.0–36.0)
MCV: 87.5 fL (ref 80.0–100.0)
MONO ABS: 0.6 10*3/uL (ref 0.2–0.9)
MONOS PCT: 12 %
Neutro Abs: 2.5 10*3/uL (ref 1.4–6.5)
Neutrophils Relative %: 54 %
Platelets: 340 10*3/uL (ref 150–440)
RBC: 3.36 MIL/uL — ABNORMAL LOW (ref 3.80–5.20)
RDW: 14.4 % (ref 11.5–14.5)
WBC: 4.7 10*3/uL (ref 3.6–11.0)

## 2015-04-26 DIAGNOSIS — M5126 Other intervertebral disc displacement, lumbar region: Secondary | ICD-10-CM | POA: Insufficient documentation

## 2015-05-17 DIAGNOSIS — S72002A Fracture of unspecified part of neck of left femur, initial encounter for closed fracture: Secondary | ICD-10-CM | POA: Insufficient documentation

## 2015-07-02 DIAGNOSIS — Z96649 Presence of unspecified artificial hip joint: Secondary | ICD-10-CM | POA: Insufficient documentation

## 2015-11-12 DIAGNOSIS — K921 Melena: Secondary | ICD-10-CM | POA: Insufficient documentation

## 2015-11-12 DIAGNOSIS — I10 Essential (primary) hypertension: Secondary | ICD-10-CM | POA: Insufficient documentation

## 2015-11-12 DIAGNOSIS — K219 Gastro-esophageal reflux disease without esophagitis: Secondary | ICD-10-CM | POA: Insufficient documentation

## 2015-11-13 DIAGNOSIS — A0472 Enterocolitis due to Clostridium difficile, not specified as recurrent: Secondary | ICD-10-CM | POA: Insufficient documentation

## 2015-11-14 DIAGNOSIS — K2211 Ulcer of esophagus with bleeding: Secondary | ICD-10-CM | POA: Insufficient documentation

## 2015-11-28 ENCOUNTER — Telehealth: Payer: Self-pay | Admitting: Gastroenterology

## 2015-11-28 NOTE — Telephone Encounter (Signed)
Attempted to contact patient to schedule for rectal bleeding with Dr. Servando Snare. No answer.

## 2015-11-29 ENCOUNTER — Telehealth: Payer: Self-pay | Admitting: Gastroenterology

## 2015-11-29 NOTE — Telephone Encounter (Signed)
Left voice message for patient to call and schedule appointment with Dr. Servando Snare for rectal bleeding. Referred by Corky Downs

## 2015-12-01 ENCOUNTER — Other Ambulatory Visit: Payer: Self-pay

## 2016-01-19 ENCOUNTER — Ambulatory Visit: Admit: 2016-01-19 | Payer: Medicare Other | Admitting: Gastroenterology

## 2016-01-19 SURGERY — ESOPHAGOGASTRODUODENOSCOPY (EGD) WITH PROPOFOL
Anesthesia: General

## 2016-06-08 DIAGNOSIS — F0280 Dementia in other diseases classified elsewhere without behavioral disturbance: Secondary | ICD-10-CM | POA: Insufficient documentation

## 2016-06-08 DIAGNOSIS — Z9181 History of falling: Secondary | ICD-10-CM | POA: Insufficient documentation

## 2016-09-07 ENCOUNTER — Emergency Department: Payer: Medicare Other

## 2016-09-07 ENCOUNTER — Emergency Department
Admission: EM | Admit: 2016-09-07 | Discharge: 2016-09-07 | Disposition: A | Discharge status: Home / Self Care | Payer: Medicare Other | Attending: Emergency Medicine | Admitting: Emergency Medicine

## 2016-09-07 DIAGNOSIS — Z79899 Other long term (current) drug therapy: Secondary | ICD-10-CM | POA: Diagnosis not present

## 2016-09-07 DIAGNOSIS — W010XXA Fall on same level from slipping, tripping and stumbling without subsequent striking against object, initial encounter: Secondary | ICD-10-CM | POA: Diagnosis not present

## 2016-09-07 DIAGNOSIS — S42211A Unspecified displaced fracture of surgical neck of right humerus, initial encounter for closed fracture: Secondary | ICD-10-CM | POA: Insufficient documentation

## 2016-09-07 DIAGNOSIS — Y999 Unspecified external cause status: Secondary | ICD-10-CM | POA: Insufficient documentation

## 2016-09-07 DIAGNOSIS — W19XXXA Unspecified fall, initial encounter: Secondary | ICD-10-CM

## 2016-09-07 DIAGNOSIS — S4991XA Unspecified injury of right shoulder and upper arm, initial encounter: Secondary | ICD-10-CM | POA: Diagnosis present

## 2016-09-07 DIAGNOSIS — I10 Essential (primary) hypertension: Secondary | ICD-10-CM | POA: Insufficient documentation

## 2016-09-07 DIAGNOSIS — Y9389 Activity, other specified: Secondary | ICD-10-CM | POA: Diagnosis not present

## 2016-09-07 DIAGNOSIS — Y92009 Unspecified place in unspecified non-institutional (private) residence as the place of occurrence of the external cause: Secondary | ICD-10-CM | POA: Diagnosis not present

## 2016-09-07 DIAGNOSIS — S42201A Unspecified fracture of upper end of right humerus, initial encounter for closed fracture: Secondary | ICD-10-CM

## 2016-09-07 LAB — CBC WITH DIFFERENTIAL/PLATELET
BASOS ABS: 0 10*3/uL (ref 0–0.1)
BASOS PCT: 1 %
EOS ABS: 0 10*3/uL (ref 0–0.7)
Eosinophils Relative: 1 %
HCT: 36.4 % (ref 35.0–47.0)
HEMOGLOBIN: 12.5 g/dL (ref 12.0–16.0)
LYMPHS ABS: 0.8 10*3/uL — AB (ref 1.0–3.6)
Lymphocytes Relative: 11 %
MCH: 31.7 pg (ref 26.0–34.0)
MCHC: 34.3 g/dL (ref 32.0–36.0)
MCV: 92.4 fL (ref 80.0–100.0)
MONOS PCT: 10 %
Monocytes Absolute: 0.7 10*3/uL (ref 0.2–0.9)
NEUTROS ABS: 5.5 10*3/uL (ref 1.4–6.5)
NEUTROS PCT: 77 %
Platelets: 186 10*3/uL (ref 150–440)
RBC: 3.94 MIL/uL (ref 3.80–5.20)
RDW: 13.2 % (ref 11.5–14.5)
WBC: 7 10*3/uL (ref 3.6–11.0)

## 2016-09-07 LAB — URINALYSIS, COMPLETE (UACMP) WITH MICROSCOPIC
BILIRUBIN URINE: NEGATIVE
Glucose, UA: NEGATIVE mg/dL
HGB URINE DIPSTICK: NEGATIVE
Ketones, ur: NEGATIVE mg/dL
LEUKOCYTES UA: NEGATIVE
NITRITE: NEGATIVE
PH: 7 (ref 5.0–8.0)
Protein, ur: NEGATIVE mg/dL
SPECIFIC GRAVITY, URINE: 1.004 — AB (ref 1.005–1.030)

## 2016-09-07 LAB — BASIC METABOLIC PANEL
ANION GAP: 7 (ref 5–15)
BUN: 15 mg/dL (ref 6–20)
CHLORIDE: 102 mmol/L (ref 101–111)
CO2: 28 mmol/L (ref 22–32)
Calcium: 9.8 mg/dL (ref 8.9–10.3)
Creatinine, Ser: 0.94 mg/dL (ref 0.44–1.00)
GFR calc non Af Amer: 53 mL/min — ABNORMAL LOW (ref >60)
GLUCOSE: 114 mg/dL — AB (ref 65–99)
POTASSIUM: 3.7 mmol/L (ref 3.5–5.1)
Sodium: 137 mmol/L (ref 135–145)

## 2016-09-07 LAB — TROPONIN I: Troponin I: <0.03 ng/mL (ref <0.03)

## 2016-09-07 MED ORDER — MORPHINE SULFATE (PF) 2 MG/ML IV SOLN
2.0000 mg | Freq: Once | INTRAVENOUS | Status: AC
  Administered 2016-09-07: 2 mg via INTRAVENOUS
  Filled 2016-09-07: qty 1

## 2016-09-07 MED ORDER — DOCUSATE SODIUM 100 MG PO CAPS: 100.0000 mg | ORAL_CAPSULE | Freq: Every day | ORAL | 2 refills | Status: AC | PRN

## 2016-09-07 MED ORDER — OXYCODONE HCL 5 MG PO TABS: 5.0000 mg | ORAL_TABLET | Freq: Three times a day (TID) | ORAL | 0 refills | Status: DC | PRN

## 2016-09-07 MED ORDER — OXYCODONE HCL 5 MG PO TABS
5.0000 mg | ORAL_TABLET | Freq: Once | ORAL | Status: AC
  Administered 2016-09-07: 5 mg via ORAL
  Filled 2016-09-07: qty 1

## 2016-09-07 NOTE — Care Management Note (Signed)
Case Management Note  Patient Details  Name: Angel Simmons MRN: [id number] Date of Birth: 12/12/1928  Subjective/Objective:      Spoke to patient and family at bedside and they requested [facility]. I called Crystal at      The office and the patient has been accepted to start this weekend. Daughter contact information Angel Simmons [phone]  , was given to the agency. A sheet with the agency number has been provided to the daughter. MD made aware of referral acceptance.       Action/Plan:   Expected Discharge Date:                  Expected Discharge Plan:     In-House Referral:     Discharge planning Services     Post Acute Care Choice:    Choice offered to:     DME Arranged:    DME Agency:     HH Arranged:    HH Agency:     Status of Service:     If discussed at [employer] of Stay Meetings, dates discussed:    Additional Comments:  Berna Bue, RN 09/07/2016, 1:07 PM

## 2016-09-07 NOTE — ED Provider Notes (Addendum)
[facility] Emergency Department Provider Note  ____________________________________________   I have reviewed the triage vital signs and the nursing notes.   HISTORY  Chief Complaint Fall    HPI Angel Simmons is a 83 y.o. female states that she was going to get the door and she tripped landing on her right arm. She did not hit her head. She did not pass out. She does not believe this was a syncopal event. She states that she has had pain in her right shoulder. She was able to get to the phone. She denies any other injury specifically denies hip pain, she denies headache stiff neck. Family states she is at her baseline. There is no seizure or postictal. She did not bite her tongue or loss of bowel or bladder no chest pain, no palpitations. Aside from reproducible pain to the right shoulder difficulty moving that arm, she has no complaints and would not be here  Past Medical History:  Diagnosis Date  . Chronic back pain   . Hip fx (HCC)   . Hypertension   . Rheumatoid arthritis (HCC)   . Spinal stenosis     Patient Active Problem List   Diagnosis Date Noted  . Hip fracture (HCC) 04/04/2015  . Chronic LBP 09/25/2013  . Degeneration of intervertebral disc of lumbar region 09/25/2013  . Neuritis or radiculitis due to rupture of lumbar intervertebral disc 09/25/2013  . Degenerative arthritis of lumbar spine 09/25/2013    Past Surgical History:  Procedure Laterality Date  . CATARACT EXTRACTION, BILATERAL    . CHOLECYSTECTOMY    . HEMORRHOID SURGERY    . HIP ARTHROPLASTY Left 04/05/2015   Procedure: ARTHROPLASTY BIPOLAR HIP (HEMIARTHROPLASTY);  Surgeon: Donato Heinz, MD;  Location: [facility] ORS;  Service: Orthopedics;  Laterality: Left;  . Percutaneous pinning of a right femoral neck fracture      Prior to Admission medications   Medication Sig Start Date End Date Taking? Authorizing Provider  enoxaparin (LOVENOX) 40 MG/0.4ML injection For 14 days then  stop 04/08/15   Alford Highland, MD  feeding supplement, ENSURE ENLIVE, (ENSURE ENLIVE) LIQD Take 237 mLs by mouth daily. 04/08/15   Alford Highland, MD  Ferrous Fumarate (HEMOCYTE - 106 MG FE) 324 (106 Fe) MG TABS tablet TK 1 T PO ONCE D 11/15/15   Historical Provider, MD  ferrous sulfate 325 (65 FE) MG tablet Take 1 tablet (325 mg total) by mouth 2 (two) times daily with a meal. 04/08/15   Alford Highland, MD  lisinopril (PRINIVIL,ZESTRIL) 20 MG tablet Take 20 mg by mouth daily.    Historical Provider, MD  oxyCODONE (OXY IR/ROXICODONE) 5 MG immediate release tablet Take 0.5 tablets (2.5 mg total) by mouth every 6 (six) hours as needed for moderate pain or severe pain. 04/08/15   Alford Highland, MD  pantoprazole (PROTONIX) 40 MG tablet TK 1 T PO ONCE D 11/15/15   Historical Provider, MD  senna-docusate (SENOKOT-S) 8.6-50 MG tablet Take 1 tablet by mouth 2 (two) times daily. 04/08/15   Alford Highland, MD  STOOL SOFTENER 100 MG capsule TK 1 C PO ONCE D 11/15/15   Historical Provider, MD    Allergies Tylenol [acetaminophen]  Family History  Problem Relation Age of Onset  . Breast cancer Mother   . Dementia Father     Social History Social History  Substance Use Topics  . Smoking status: Never Smoker  . Smokeless tobacco: Never Used  . Alcohol use No    Review of  Systems Constitutional: No fever/chills Eyes: No visual changes. ENT: No sore throat. No stiff neck no neck pain Cardiovascular: Denies chest pain. Respiratory: Denies shortness of breath. Gastrointestinal:   no vomiting.  No diarrhea.  No constipation. Genitourinary: Negative for dysuria. Musculoskeletal: Negative lower extremity swelling Skin: Negative for rash. Neurological: Negative for severe headaches, focal weakness or numbness. 10-point ROS otherwise negative.  ____________________________________________   PHYSICAL EXAM:  VITAL SIGNS: ED Triage Vitals  Enc Vitals Group     BP 09/07/16 0931 139/71     Pulse  Rate 09/07/16 0931 66     Resp 09/07/16 0931 17     Temp 09/07/16 0931 97.8 F (36.6 C)     Temp Source 09/07/16 0931 Oral     SpO2 09/07/16 0931 95 %     Weight 09/07/16 0934 119 lb (54 kg)     Height 09/07/16 0934 5' 4.5" (1.638 m)     Head Circumference --      Peak Flow --      Pain Score 09/07/16 0931 9     Pain Loc --      Pain Edu? --      Excl. in GC? --     Constitutional: Alert and oriented. Well appearing and in no acute distress. Eyes: Conjunctivae are normal. PERRL. EOMI. Head: Atraumatic. Nose: No congestion/rhinnorhea. Mouth/Throat: Mucous membranes are moist.  Oropharynx non-erythematous. Neck: No stridor.   Nontender with no meningismus Cardiovascular: Normal rate, regular rhythm. Grossly normal heart sounds.  Good peripheral circulation. Respiratory: Normal respiratory effort.  No retractions. Lungs CTAB. Abdominal: Soft and nontender. No distention. No guarding no rebound Back:  There is no focal tenderness or step off.  there is no midline tenderness there are no lesions noted. there is no CVA tenderness Musculoskeletal: No lower extremity tenderness, full painless range of motion of the hips, knees, ankles. positive tenderness to palpation in the proximal right humerus, elbow does not seem to be tender and she has strong distal pulses with no other evidence of injury. No obvious deformity noted. No open wound. No joint effusions, no DVT signs strong distal pulses no edema Neurologic:  Normal speech and language. No gross focal neurologic deficits are appreciated.  Skin:  Skin is warm, dry and intact. No rash noted. Psychiatric: Mood and affect are normal. Speech and behavior are normal.  ____________________________________________   LABS (all labs ordered are listed, but only abnormal results are displayed)  Labs Reviewed  CBC WITH DIFFERENTIAL/PLATELET - Abnormal; Notable for the following:       Result Value   Lymphs Abs 0.8 (*)    All other components  within normal limits  BASIC METABOLIC PANEL  TROPONIN I  URINALYSIS, COMPLETE (UACMP) WITH MICROSCOPIC   ____________________________________________  EKG  I personally interpreted any EKGs ordered by me or triage Sinus rhythm rate 65 bpm, right bundle-branch block and LAFB with no acute ischemic changes ____________________________________________  RADIOLOGY  I reviewed any imaging ordered by me or triage that were performed during my shift and, if possible, patient and/or family made aware of any abnormal findings. ____________________________________________   PROCEDURES  Procedure(s) performed: None  Procedures  Critical Care performed: None  ____________________________________________   INITIAL IMPRESSION / ASSESSMENT AND PLAN / ED COURSE  Pertinent labs & imaging results that were available during my care of the patient were reviewed by me and considered in my medical decision making (see chart for details).  Patient with a non-syncopal fall at home. Given her  age, and her fracture we are going to do basic blood work to ensure that there was no other cause for her to fall low sounds as if she just tripped. If that is all reassuring, it is my hope that we can safely get her home with a splint after discussion with orthopedic surgery as usually these are nonoperative. Will give her morphine here. Patient lists an intolerance to Tylenol. We will discuss that with her and what analgesia at home might be appropriate. I don't see any other evidence of injury specifically nothing to suggest the patient. He is adamant that she did not, no neck injury no spinal injury, and she has no evidence of hip fracture. Patient and family very comfortable with this plan.  ----------------------------------------- 12:04 PM on 09/07/2016 -----------------------------------------  X-ray consistent with physical findings non-open, discussed with Dr. Real Cons of orthopedic surgery who  recommends sling and close f/u.   ----------------------------------------- 1:08 PM on 09/07/2016 -----------------------------------------  Administration is awake and alert and relates with no difficulty is wearing a sling, will follow up, no other injury or acute pathology noted. Return precautions and follow-up given and understood. Patient is with family. I'm also arranging home physical therapy to come help her.    ____________________________________________   FINAL CLINICAL IMPRESSION(S) / ED DIAGNOSES  Final diagnoses:  None      This chart was dictated using voice recognition software.  Despite best efforts to proofread,  errors can occur which can change meaning.      Jeanmarie Plant, MD 09/07/16 1112    Jeanmarie Plant, MD 09/07/16 6644    Jeanmarie Plant, MD 09/07/16 [phone]

## 2016-09-07 NOTE — ED Notes (Addendum)
Pt assisted to toilet to urinate with no problem then assisted back to bed. Pt daughter's at bedside. Pt bed changed due to being spoiled and pt given warm blanket.

## 2016-09-07 NOTE — Consult Note (Signed)
Called by Dr. Alphonzo Lemmings regarding this 83 year old female who injured her right upper extremity after a fall at home today.  I have reviewed the xrays showing an impacted fracture of the right proximal humerus fracture.   Patient may be placed in a sling or shoulder immobilizer and sent to our office next week.

## 2016-11-19 ENCOUNTER — Other Ambulatory Visit: Payer: Self-pay | Admitting: Surgery

## 2016-11-19 DIAGNOSIS — S42291D Other displaced fracture of upper end of right humerus, subsequent encounter for fracture with routine healing: Secondary | ICD-10-CM

## 2016-11-20 ENCOUNTER — Ambulatory Visit
Admission: RE | Admit: 2016-11-20 | Discharge: 2016-11-20 | Disposition: A | Discharge status: Home / Self Care | Payer: Medicare Other | Source: Ambulatory Visit | Attending: Surgery | Admitting: Surgery

## 2016-11-20 DIAGNOSIS — S42291D Other displaced fracture of upper end of right humerus, subsequent encounter for fracture with routine healing: Secondary | ICD-10-CM | POA: Insufficient documentation

## 2016-11-20 DIAGNOSIS — M75121 Complete rotator cuff tear or rupture of right shoulder, not specified as traumatic: Secondary | ICD-10-CM | POA: Insufficient documentation

## 2016-11-20 DIAGNOSIS — M19011 Primary osteoarthritis, right shoulder: Secondary | ICD-10-CM | POA: Diagnosis not present

## 2017-02-06 DIAGNOSIS — G8929 Other chronic pain: Secondary | ICD-10-CM | POA: Insufficient documentation

## 2017-02-06 DIAGNOSIS — M2550 Pain in unspecified joint: Secondary | ICD-10-CM

## 2017-09-10 ENCOUNTER — Ambulatory Visit
Admission: RE | Admit: 2017-09-10 | Discharge: 2017-09-10 | Disposition: A | Discharge status: Home / Self Care | Payer: Medicare Other | Source: Ambulatory Visit | Attending: Internal Medicine | Admitting: Internal Medicine

## 2017-09-10 ENCOUNTER — Other Ambulatory Visit: Payer: Self-pay | Admitting: Internal Medicine

## 2017-09-10 DIAGNOSIS — M25551 Pain in right hip: Secondary | ICD-10-CM | POA: Diagnosis not present

## 2017-09-10 DIAGNOSIS — I700 Atherosclerosis of aorta: Secondary | ICD-10-CM | POA: Diagnosis not present

## 2017-09-10 DIAGNOSIS — R52 Pain, unspecified: Secondary | ICD-10-CM

## 2017-09-10 DIAGNOSIS — Z96642 Presence of left artificial hip joint: Secondary | ICD-10-CM | POA: Insufficient documentation

## 2017-09-10 DIAGNOSIS — M47816 Spondylosis without myelopathy or radiculopathy, lumbar region: Secondary | ICD-10-CM | POA: Diagnosis not present

## 2017-09-10 DIAGNOSIS — M545 Low back pain: Secondary | ICD-10-CM | POA: Insufficient documentation

## 2017-09-10 DIAGNOSIS — M419 Scoliosis, unspecified: Secondary | ICD-10-CM | POA: Diagnosis not present

## 2017-09-12 ENCOUNTER — Encounter: Payer: Self-pay | Admitting: Emergency Medicine

## 2017-09-12 ENCOUNTER — Observation Stay
Admission: EM | Admit: 2017-09-12 | Discharge: 2017-09-13 | Disposition: A | Discharge status: Home / Self Care | Payer: Medicare Other | Attending: Internal Medicine | Admitting: Internal Medicine

## 2017-09-12 ENCOUNTER — Emergency Department: Payer: Medicare Other

## 2017-09-12 ENCOUNTER — Other Ambulatory Visit: Payer: Self-pay

## 2017-09-12 DIAGNOSIS — N390 Urinary tract infection, site not specified: Secondary | ICD-10-CM | POA: Diagnosis not present

## 2017-09-12 DIAGNOSIS — I700 Atherosclerosis of aorta: Secondary | ICD-10-CM | POA: Diagnosis not present

## 2017-09-12 DIAGNOSIS — R0902 Hypoxemia: Secondary | ICD-10-CM | POA: Diagnosis not present

## 2017-09-12 DIAGNOSIS — Z886 Allergy status to analgesic agent status: Secondary | ICD-10-CM | POA: Insufficient documentation

## 2017-09-12 DIAGNOSIS — G9340 Encephalopathy, unspecified: Secondary | ICD-10-CM | POA: Diagnosis not present

## 2017-09-12 DIAGNOSIS — I10 Essential (primary) hypertension: Secondary | ICD-10-CM | POA: Diagnosis present

## 2017-09-12 DIAGNOSIS — E876 Hypokalemia: Secondary | ICD-10-CM | POA: Diagnosis present

## 2017-09-12 DIAGNOSIS — T40601A Poisoning by unspecified narcotics, accidental (unintentional), initial encounter: Secondary | ICD-10-CM

## 2017-09-12 DIAGNOSIS — M419 Scoliosis, unspecified: Secondary | ICD-10-CM | POA: Insufficient documentation

## 2017-09-12 DIAGNOSIS — M069 Rheumatoid arthritis, unspecified: Secondary | ICD-10-CM | POA: Insufficient documentation

## 2017-09-12 DIAGNOSIS — B0229 Other postherpetic nervous system involvement: Secondary | ICD-10-CM | POA: Diagnosis present

## 2017-09-12 DIAGNOSIS — K449 Diaphragmatic hernia without obstruction or gangrene: Secondary | ICD-10-CM | POA: Insufficient documentation

## 2017-09-12 DIAGNOSIS — K219 Gastro-esophageal reflux disease without esophagitis: Secondary | ICD-10-CM | POA: Diagnosis not present

## 2017-09-12 DIAGNOSIS — M25551 Pain in right hip: Secondary | ICD-10-CM | POA: Diagnosis not present

## 2017-09-12 DIAGNOSIS — Z96642 Presence of left artificial hip joint: Secondary | ICD-10-CM | POA: Diagnosis not present

## 2017-09-12 DIAGNOSIS — R4182 Altered mental status, unspecified: Secondary | ICD-10-CM

## 2017-09-12 DIAGNOSIS — Z79899 Other long term (current) drug therapy: Secondary | ICD-10-CM | POA: Insufficient documentation

## 2017-09-12 LAB — URINALYSIS, COMPLETE (UACMP) WITH MICROSCOPIC
BILIRUBIN URINE: NEGATIVE
GLUCOSE, UA: NEGATIVE mg/dL
HGB URINE DIPSTICK: NEGATIVE
Ketones, ur: NEGATIVE mg/dL
Nitrite: POSITIVE — AB
PROTEIN: NEGATIVE mg/dL
SPECIFIC GRAVITY, URINE: 1.004 — AB (ref 1.005–1.030)
pH: 8 (ref 5.0–8.0)

## 2017-09-12 LAB — COMPREHENSIVE METABOLIC PANEL
ALBUMIN: 4 g/dL (ref 3.5–5.0)
ALK PHOS: 60 U/L (ref 38–126)
ALT: 24 U/L (ref 14–54)
AST: 38 U/L (ref 15–41)
Anion gap: 11 (ref 5–15)
BUN: 19 mg/dL (ref 6–20)
CALCIUM: 9 mg/dL (ref 8.9–10.3)
CHLORIDE: 97 mmol/L — AB (ref 101–111)
CO2: 26 mmol/L (ref 22–32)
CREATININE: 1.07 mg/dL — AB (ref 0.44–1.00)
GFR calc non Af Amer: 45 mL/min — ABNORMAL LOW (ref >60)
GFR, EST AFRICAN AMERICAN: 53 mL/min — AB (ref >60)
GLUCOSE: 128 mg/dL — AB (ref 65–99)
Potassium: 3.1 mmol/L — ABNORMAL LOW (ref 3.5–5.1)
SODIUM: 134 mmol/L — AB (ref 135–145)
Total Bilirubin: 0.6 mg/dL (ref 0.3–1.2)
Total Protein: 6.8 g/dL (ref 6.5–8.1)

## 2017-09-12 LAB — TROPONIN I

## 2017-09-12 LAB — CBC
HCT: 35.2 % (ref 35.0–47.0)
Hemoglobin: 12.2 g/dL (ref 12.0–16.0)
MCH: 32.6 pg (ref 26.0–34.0)
MCHC: 34.6 g/dL (ref 32.0–36.0)
MCV: 94.4 fL (ref 80.0–100.0)
PLATELETS: 228 10*3/uL (ref 150–440)
RBC: 3.73 MIL/uL — AB (ref 3.80–5.20)
RDW: 13.6 % (ref 11.5–14.5)
WBC: 8 10*3/uL (ref 3.6–11.0)

## 2017-09-12 LAB — LACTIC ACID, PLASMA: Lactic Acid, Venous: 0.9 mmol/L (ref 0.5–1.9)

## 2017-09-12 MED ORDER — SODIUM CHLORIDE 0.9 % IV SOLN: 2.0000 g | INTRAVENOUS | Status: DC

## 2017-09-12 MED ORDER — POLYETHYLENE GLYCOL 3350 17 G PO PACK
17.0000 g | PACK | Freq: Every day | ORAL | Status: DC
  Administered 2017-09-12: 14:00:00 17 g via ORAL
  Filled 2017-09-12: qty 1

## 2017-09-12 MED ORDER — GABAPENTIN 100 MG PO CAPS
100.0000 mg | ORAL_CAPSULE | Freq: Three times a day (TID) | ORAL | Status: DC
  Administered 2017-09-12 – 2017-09-13 (×4): 100 mg via ORAL
  Filled 2017-09-12 (×4): qty 1

## 2017-09-12 MED ORDER — BISACODYL 10 MG RE SUPP
10.0000 mg | Freq: Every day | RECTAL | Status: DC | PRN
  Administered 2017-09-12: 17:00:00 10 mg via RECTAL
  Filled 2017-09-12 (×3): qty 1

## 2017-09-12 MED ORDER — LISINOPRIL 5 MG PO TABS
5.0000 mg | ORAL_TABLET | Freq: Every day | ORAL | Status: DC
  Administered 2017-09-12: 5 mg via ORAL
  Filled 2017-09-12: qty 1

## 2017-09-12 MED ORDER — LISINOPRIL 5 MG PO TABS: 5.0000 mg | ORAL_TABLET | Freq: Every day | ORAL | Status: DC

## 2017-09-12 MED ORDER — CAPSAICIN 0.025 % EX CREA
TOPICAL_CREAM | Freq: Two times a day (BID) | CUTANEOUS | Status: DC
  Administered 2017-09-12 – 2017-09-13 (×3): via TOPICAL
  Filled 2017-09-12: qty 60

## 2017-09-12 MED ORDER — OXYCODONE HCL 5 MG PO TABS
5.0000 mg | ORAL_TABLET | Freq: Four times a day (QID) | ORAL | Status: DC | PRN
  Administered 2017-09-12 – 2017-09-13 (×2): 5 mg via ORAL
  Filled 2017-09-12 (×2): qty 1

## 2017-09-12 MED ORDER — OXYCODONE HCL 5 MG PO TABS
2.5000 mg | ORAL_TABLET | Freq: Two times a day (BID) | ORAL | Status: DC | PRN
  Administered 2017-09-12: 07:00:00 5 mg via ORAL
  Filled 2017-09-12: qty 1

## 2017-09-12 MED ORDER — LABETALOL HCL 5 MG/ML IV SOLN: 5.0000 mg | INTRAVENOUS | Status: DC | PRN

## 2017-09-12 MED ORDER — MORPHINE SULFATE (PF) 2 MG/ML IV SOLN: 2.0000 mg | Freq: Once | INTRAVENOUS | Status: DC

## 2017-09-12 MED ORDER — ONDANSETRON HCL 4 MG PO TABS: 4.0000 mg | ORAL_TABLET | Freq: Four times a day (QID) | ORAL | Status: DC | PRN

## 2017-09-12 MED ORDER — ONDANSETRON HCL 4 MG/2ML IJ SOLN: 4.0000 mg | Freq: Four times a day (QID) | INTRAMUSCULAR | Status: DC | PRN

## 2017-09-12 MED ORDER — MORPHINE SULFATE (PF) 2 MG/ML IV SOLN
INTRAVENOUS | Status: AC
  Administered 2017-09-12: 21:00:00 2 mg via INTRAVENOUS
  Filled 2017-09-12: qty 1

## 2017-09-12 MED ORDER — MEMANTINE HCL 5 MG PO TABS
2.5000 mg | ORAL_TABLET | Freq: Every day | ORAL | Status: DC
  Administered 2017-09-12 – 2017-09-13 (×2): 2.5 mg via ORAL
  Filled 2017-09-12 (×2): qty 1

## 2017-09-12 MED ORDER — DOCUSATE SODIUM 100 MG PO CAPS
100.0000 mg | ORAL_CAPSULE | Freq: Two times a day (BID) | ORAL | Status: DC
  Administered 2017-09-12 – 2017-09-13 (×3): 100 mg via ORAL
  Filled 2017-09-12 (×3): qty 1

## 2017-09-12 MED ORDER — PANTOPRAZOLE SODIUM 40 MG PO TBEC
40.0000 mg | DELAYED_RELEASE_TABLET | Freq: Every day | ORAL | Status: DC
  Administered 2017-09-12 – 2017-09-13 (×2): 40 mg via ORAL
  Filled 2017-09-12 (×2): qty 1

## 2017-09-12 MED ORDER — IBUPROFEN 400 MG PO TABS
400.0000 mg | ORAL_TABLET | Freq: Four times a day (QID) | ORAL | Status: DC | PRN
  Administered 2017-09-12: 05:00:00 400 mg via ORAL
  Filled 2017-09-12: qty 1

## 2017-09-12 MED ORDER — LIDOCAINE 4 % EX CREA
TOPICAL_CREAM | Freq: Once | CUTANEOUS | Status: AC
  Administered 2017-09-12: 02:00:00 via TOPICAL
  Filled 2017-09-12: qty 5

## 2017-09-12 MED ORDER — LISINOPRIL 20 MG PO TABS
20.0000 mg | ORAL_TABLET | Freq: Every day | ORAL | Status: DC
  Administered 2017-09-13: 20 mg via ORAL
  Filled 2017-09-12: qty 1

## 2017-09-12 MED ORDER — LIDOCAINE 5 % EX PTCH
1.0000 | MEDICATED_PATCH | CUTANEOUS | Status: DC
  Administered 2017-09-12 – 2017-09-13 (×2): 1 via TRANSDERMAL
  Filled 2017-09-12 (×2): qty 1

## 2017-09-12 MED ORDER — MEMANTINE HCL 5 MG PO TABS: 2.5000 mg | ORAL_TABLET | Freq: Two times a day (BID) | ORAL | Status: DC

## 2017-09-12 MED ORDER — SODIUM CHLORIDE 0.9 % IV SOLN
1.0000 g | INTRAVENOUS | Status: DC
  Administered 2017-09-12: 20:00:00 1 g via INTRAVENOUS
  Filled 2017-09-12 (×2): qty 10

## 2017-09-12 MED ORDER — MEMANTINE HCL 5 MG PO TABS
5.0000 mg | ORAL_TABLET | Freq: Every day | ORAL | Status: DC
  Administered 2017-09-12: 5 mg via ORAL
  Filled 2017-09-12: qty 1

## 2017-09-12 MED ORDER — MORPHINE SULFATE (PF) 2 MG/ML IV SOLN
2.0000 mg | INTRAVENOUS | Status: DC | PRN
  Administered 2017-09-12 – 2017-09-13 (×2): 2 mg via INTRAVENOUS
  Filled 2017-09-12: qty 1

## 2017-09-12 MED ORDER — SODIUM CHLORIDE 0.9 % IV SOLN
1.0000 g | Freq: Once | INTRAVENOUS | Status: AC
  Administered 2017-09-12: 1 g via INTRAVENOUS
  Filled 2017-09-12: qty 10

## 2017-09-12 MED ORDER — SODIUM CHLORIDE 0.9 % IV BOLUS
1000.0000 mL | Freq: Once | INTRAVENOUS | Status: AC
  Administered 2017-09-12: 1000 mL via INTRAVENOUS

## 2017-09-12 MED ORDER — LISINOPRIL 5 MG PO TABS
15.0000 mg | ORAL_TABLET | Freq: Once | ORAL | Status: AC
  Administered 2017-09-12: 15 mg via ORAL
  Filled 2017-09-12: qty 1

## 2017-09-12 MED ORDER — ENOXAPARIN SODIUM 40 MG/0.4ML ~~LOC~~ SOLN
40.0000 mg | SUBCUTANEOUS | Status: DC
  Administered 2017-09-12: 20:00:00 40 mg via SUBCUTANEOUS
  Filled 2017-09-12: qty 0.4

## 2017-09-12 MED ORDER — OXYCODONE HCL 5 MG PO TABS
5.0000 mg | ORAL_TABLET | Freq: Four times a day (QID) | ORAL | Status: DC | PRN
  Administered 2017-09-12 (×2): 5 mg via ORAL
  Filled 2017-09-12 (×2): qty 1

## 2017-09-12 NOTE — Progress Notes (Addendum)
[facility] [place] [facility]   PATIENT NAME: Angel Simmons    MR#:  [id number]  DATE OF BIRTH:  1928/09/24  SUBJECTIVE:  came in with confusion at home. Daughter in the room. Patient answered most questions appropriately. She is having significant pain in her right hip and thigh  REVIEW OF SYSTEMS:   Review of Systems  Constitutional: Negative for chills, fever and weight loss.  HENT: Negative for ear discharge, ear pain and nosebleeds.   Eyes: Negative for blurred vision, pain and discharge.  Respiratory: Negative for sputum production, shortness of breath, wheezing and stridor.   Cardiovascular: Negative for chest pain, palpitations, orthopnea and PND.  Gastrointestinal: Negative for abdominal pain, diarrhea, nausea and vomiting.  Genitourinary: Negative for frequency and urgency.  Musculoskeletal: Negative for back pain and joint pain.  Neurological: Positive for weakness. Negative for sensory change, speech change and focal weakness.  Psychiatric/Behavioral: Negative for depression and hallucinations. The patient is not nervous/anxious.    Tolerating Diet:yes Tolerating BB:[id number]   DRUG ALLERGIES:   Allergies  Allergen Reactions  . Tylenol [Acetaminophen] Other (See Comments)    Reaction:  Headaches     VITALS:  Blood pressure (!) 172/92, pulse 80, temperature 97.8 F (36.6 C), temperature source Oral, resp. rate 18, height 5\' 4"  (1.626 m), weight 52.8 kg (116 lb 6.4 oz), SpO2 97 %.  PHYSICAL EXAMINATION:   Physical Exam  GENERAL:  84 y.o.-year-old patient lying in the bed with no acute distress.  EYES: Pupils equal, round, reactive to light and accommodation. No scleral icterus. Extraocular muscles intact.  HEENT: Head atraumatic, normocephalic. Oropharynx and nasopharynx clear.  NECK:  Supple, no jugular venous distention. No thyroid enlargement, no tenderness.  LUNGS: Normal breath sounds bilaterally, no wheezing, rales, rhonchi. No  use of accessory muscles of respiration.  CARDIOVASCULAR: S1, S2 normal. No murmurs, rubs, or gallops.  ABDOMEN: Soft, nontender, nondistended. Bowel sounds present. No organomegaly or mass.  EXTREMITIES: No cyanosis, clubbing or edema b/l.    NEUROLOGIC: Cranial nerves II through XII are intact. No focal Motor or sensory deficits b/l.   PSYCHIATRIC:  patient is alert and oriented x 3.  SKIN: No obvious rash, lesion, or ulcer.  Dried up herpetic lesions on the right hip  LABORATORY PANEL:  CBC Recent Labs  Lab 09/12/17 0035  WBC 8.0  HGB 12.2  HCT 35.2  PLT 228    Chemistries  Recent Labs  Lab 09/12/17 0035  NA 134*  K 3.1*  CL 97*  CO2 26  GLUCOSE 128*  BUN 19  CREATININE 1.07*  CALCIUM 9.0  AST 38  ALT 24  ALKPHOS 60  BILITOT 0.6   Cardiac Enzymes Recent Labs  Lab 09/12/17 0035  TROPONINI <0.03   RADIOLOGY:  Dg Lumbar Spine Complete  Result Date: 09/10/2017 CLINICAL DATA:  84 year old female with pain right lower back and posterior right hip for 3 weeks. No recent injury. Initial encounter. EXAM: LUMBAR SPINE - COMPLETE 4+ VIEW COMPARISON:  12/21/2013 MR. FINDINGS: Six non rib-bearing lumbar type vertebra. Level assignment on prior MR with last fully open disc space at the L5-S1 level. Scoliosis lumbar spine convex left with superimposed multilevel degenerative changes. Prominent L1-2 and L2-3 disc space narrowing greater on the right. L4-5 facet degenerative changes with mild anterior slip L4. Moderate L4-5 disc space narrowing. Moderate T12-L1 disc space narrowing. No pars defect detected. No acute compression fracture. Prior pinning right hip and left hip replacement. Vascular calcifications. Aorta slightly  ectatic. Mitral valve calcifications. IMPRESSION: Scoliosis lumbar spine convex left with superimposed multilevel degenerative changes. Prominent L1-2 and L2-3 disc space narrowing greater on the right. L4-5 facet degenerative changes with mild anterior slip L4.  Moderate L4-5 disc space narrowing. Moderate T12-L1 disc space narrowing. Aortic Atherosclerosis (ICD10-I70.0). Electronically Signed   By: Lacy Duverney M.D.   On: 09/10/2017 18:30   Ct Head Wo Contrast  Result Date: 09/12/2017 CLINICAL DATA:  Patient was diagnosed with shingles on April 5th. Now with increased pain and altered mental status. EXAM: CT HEAD WITHOUT CONTRAST TECHNIQUE: Contiguous axial images were obtained from the base of the skull through the vertex without intravenous contrast. COMPARISON:  None. FINDINGS: Brain: Diffuse cerebral atrophy. Ventricular dilatation consistent with central atrophy. Low-attenuation changes in the deep white matter consistent with small vessel ischemia. Basal ganglia calcifications. No mass effect or midline shift. No abnormal extra-axial fluid collections. Gray-white matter junctions are distinct. Basal cisterns are not effaced. No acute intracranial hemorrhage. Vascular: Intracranial arterial vascular calcifications are present. Skull: Calvarium appears intact. Sinuses/Orbits: Paranasal sinuses and mastoid air cells are clear. Other: None. IMPRESSION: No acute intracranial abnormalities. Chronic atrophy and small vessel ischemic changes. Electronically Signed   By: Burman Nieves M.D.   On: 09/12/2017 01:37   Dg Chest Port 1 View  Result Date: 09/12/2017 CLINICAL DATA:  Hypoxia and altered mental status. History of shingles diagnosed August 16, 2017. EXAM: PORTABLE CHEST 1 VIEW COMPARISON:  04/04/2015 FINDINGS: Heart is top-normal in size. Moderate superimposed hiatal hernia. Mitral annular calcifications are redemonstrated with aortic atherosclerosis. No aneurysm. Pulmonary hyperinflation without acute pulmonary consolidation. No effusion or pneumothorax. No pulmonary edema. Remote impacted right surgical neck fracture of the humerus. IMPRESSION: 1. No active disease. 2. Moderate-sized hiatal hernia. 3. Aortic atherosclerosis without aneurysm. 4. Remote right  impacted surgical neck fracture of the humerus. Electronically Signed   By: Tollie Eth M.D.   On: 09/12/2017 01:20   Dg Hip Unilat With Pelvis 2-3 Views Right  Result Date: 09/10/2017 CLINICAL DATA:  84 year old female with pain right lower back and posterior right hip for 3 weeks. No recent injury. Initial encounter. EXAM: DG HIP (WITH OR WITHOUT PELVIS) 2-3V RIGHT COMPARISON:  Lumbar spine plain film exam same date dictated separately. FINDINGS: Prior pinning right hip. No loosening of pins. No plain film evidence of right femoral head avascular necrosis. Minimal right hip joint degenerative changes. No fracture or dislocation. Post total left hip replacement. No pelvic fracture noted. Prominent degenerative changes lumbar spine. Please see lumbar spine report. Moderate stool. Vascular calcifications. IMPRESSION: Post right hip pinning and left hip replacement without complication noted. Minimal right hip degenerative changes. Prominent degenerative changes lumbar spine. Please see lumbar spine report. Electronically Signed   By: Lacy Duverney M.D.   On: 09/10/2017 18:33   ASSESSMENT AND PLAN:   Faithe Ariola  is a 84 y.o. female who presents with post herpetic neuralgia for the past three weeks, and acute onset of confusion over the last day.  Has been treated in outpatient setting for her zoster outbreak a month ago with good resolution of lesions, but persistent pain. Patient came in with some confusion.   *UTI (urinary tract infection) - Nitrite positive UA, culture sent - IV rocephin -afebrile  * Acute encephalopathy - due to UTI, monitor for improvement with resolution of infection, see treatment above  *  Post herpetic neuralgia - topical capsaicin, gabapentin -lidocaine patch -no active lesions. DC isolation. This was discussed with Maralyn Sago  wall infectious control nurse.  *  HTN (hypertension) - continue home meds  *  GERD - home dose PPI  PT to see pt. D/w dter in the room  Case  discussed with Care Management/Social Worker. Management plans discussed with the patient, family and they are in agreement.  CODE STATUS: full  DVT Prophylaxis: lovenox  TOTAL TIME TAKING CARE OF THIS PATIENT: *30* minutes.  >50% time spent on counselling and coordination of care  POSSIBLE D/C IN 1 DAYS, DEPENDING ON CLINICAL CONDITION.  Note: This dictation was prepared with Dragon dictation along with smaller phrase technology. Any transcriptional errors that result from this process are unintentional.  Enedina Finner M.D on 09/12/2017 at 8:26 AM  Between 7am to 6pm - Pager - [phone]  After 6pm go to [email or web address] - Social research officer, government  [facility]  Office  [phone]  CC: Primary care physician; Corky Downs, MDPatient ID: Angel Simmons, female   DOB: 22-Jan-1929, 84 y.o.   MRN: [id number]

## 2017-09-12 NOTE — Plan of Care (Signed)
Pt had short episodes of shooting pain in R hip during the shift. Oxycodone given x2 with some improvement. Distraction utilized. Lidocaine patch applied to R hip. Pt c/o constipation. Miralax and colace initiated. Dulcolax supp given, pt had a BM.

## 2017-09-12 NOTE — Progress Notes (Addendum)
Pharmacy Antibiotic Note  Angel Simmons is a 84 y.o. female admitted on 09/12/2017 with UTI.  Pharmacy has been consulted for ceftriaxone dosing.  Plan: Ceftriaxone 1 grams q 24 hours ordered.  Weight: 113 lb (51.3 kg)  Temp (24hrs), Avg:98.6 F (37 C), Min:98.6 F (37 C), Max:98.6 F (37 C)  Recent Labs  Lab 09/12/17 0035 09/12/17 0137  WBC 8.0  --   CREATININE 1.07*  --   LATICACIDVEN  --  0.9    CrCl cannot be calculated (Unknown ideal weight.).    Allergies  Allergen Reactions  . Tylenol [Acetaminophen] Other (See Comments)    Reaction:  Headaches     Antimicrobials this admission: Ceftriaxone 5/1  >>    >>   Dose adjustments this admission:   Microbiology results: 5/2 BCx: pending 5/2 UCx: pending       5/3 UA: LE(+) NO2(+) Thank you for allowing pharmacy to be a part of this patient's care.  McBane,Matthew S 09/12/2017 3:50 AM

## 2017-09-12 NOTE — ED Triage Notes (Signed)
Pt arrived with family to ED for AMS  Pt diagnosed with shingles on April 5, prescribed gabapentin and was seen back at pcp on Tuesday due to worsening pain to the right hip, x-rays performed and pt sent home with methylprednisolone 4mg . Family noticed tonight that pt was c/o increased pain, talking "out of her head," and was not acting like herself. Pt taken to [facility] 1 for further evaluation.

## 2017-09-12 NOTE — Plan of Care (Signed)
  Problem: Urinary Elimination: Goal: Signs and symptoms of infection will decrease Outcome: Progressing   Problem: Clinical Measurements: Goal: Will remain free from infection Outcome: Not Progressing Goal: Diagnostic test results will improve Outcome: Not Progressing   Problem: Activity: Goal: Risk for activity intolerance will decrease Outcome: Progressing   Problem: Pain Managment: Goal: General experience of comfort will improve Outcome: Not Progressing   Problem: Safety: Goal: Ability to remain free from injury will improve Outcome: Progressing   Problem: Skin Integrity: Goal: Risk for impaired skin integrity will decrease Outcome: Progressing

## 2017-09-12 NOTE — Care Management (Addendum)
Placed in observation from home after presenting with confusion. Recent treatment for shingles and having lingering pain.  Being treated for UTI.  WBC wnl.  Afebrile. PT consult is pending.  Would be agreeable for home health.  [facility] would be agency preference.  Heads up referral to [facility] for SN and PT

## 2017-09-12 NOTE — Care Management Obs Status (Signed)
MEDICARE OBSERVATION STATUS NOTIFICATION   Patient Details  Name: Angel Simmons MRN: [id number] Date of Birth: 11/09/1928   Medicare Observation Status Notification Given:  Yes    Eber Hong, RN 09/12/2017, 4:39 PM

## 2017-09-12 NOTE — H&P (Signed)
[facility] [place] at [facility]   PATIENT NAME: Angel Simmons    MR#:  [id number]  DATE OF BIRTH:  06/19/1928  DATE OF ADMISSION:  09/12/2017  PRIMARY CARE PHYSICIAN: Corky Downs, MD   REQUESTING/REFERRING PHYSICIAN: Dolores Frame, MD  CHIEF COMPLAINT:   Chief Complaint  Patient presents with  . Altered Mental Status    HISTORY OF PRESENT ILLNESS:  Angel Simmons  is a 84 y.o. female who presents with post herpetic neuralgia for the past three weeks, and acute onset of confusion over the last day.  Has been treated in outpatient setting for her zoster outbreak a month ago with good resolution of lesions, but persistent pain.  Subsequent neuralgia treatments have had limited success.  Patient had some confusion over the last day or so, with seeming increase in neuralgia pain.  Brought to ED for evaluation by family.  Found here in ED to have UTI with nitrite positive UA and some persistent confusion.  Hospitalists called for admission  PAST MEDICAL HISTORY:   Past Medical History:  Diagnosis Date  . Chronic back pain   . Hip fx (HCC)   . Hypertension   . Rheumatoid arthritis (HCC)   . Spinal stenosis      PAST SURGICAL HISTORY:   Past Surgical History:  Procedure Laterality Date  . CATARACT EXTRACTION, BILATERAL    . CHOLECYSTECTOMY    . HEMORRHOID SURGERY    . HIP ARTHROPLASTY Left 04/05/2015   Procedure: ARTHROPLASTY BIPOLAR HIP (HEMIARTHROPLASTY);  Surgeon: Donato Heinz, MD;  Location: [facility] ORS;  Service: Orthopedics;  Laterality: Left;  . Percutaneous pinning of a right femoral neck fracture       SOCIAL HISTORY:   Social History   Tobacco Use  . Smoking status: Never Smoker  . Smokeless tobacco: Never Used  Substance Use Topics  . Alcohol use: No     FAMILY HISTORY:   Family History  Problem Relation Age of Onset  . Breast cancer Mother   . Dementia Father      DRUG ALLERGIES:   Allergies  Allergen Reactions  . Tylenol  [Acetaminophen] Other (See Comments)    Reaction:  Headaches     MEDICATIONS AT HOME:   Prior to Admission medications   Medication Sig Start Date End Date Taking? Authorizing Provider  gabapentin (NEURONTIN) 100 MG capsule Take 100 mg by mouth 3 (three) times daily.   Yes [provider]  lisinopril (PRINIVIL,ZESTRIL) 5 MG tablet Take 5 mg by mouth daily.   Yes [provider]  memantine (NAMENDA) 5 MG tablet Take 2.5-5 mg by mouth 2 (two) times daily. 1/2 tablet in the morning and 1 tablet in the evening   Yes [provider]  pantoprazole (PROTONIX) 40 MG tablet Take 40 mg by mouth daily.   Yes [provider]    REVIEW OF SYSTEMS:  Review of Systems  Constitutional: Negative for chills, fever, malaise/fatigue and weight loss.  HENT: Negative for ear pain, hearing loss and tinnitus.   Eyes: Negative for blurred vision, double vision, pain and redness.  Respiratory: Negative for cough, hemoptysis and shortness of breath.   Cardiovascular: Negative for chest pain, palpitations, orthopnea and leg swelling.  Gastrointestinal: Negative for abdominal pain, constipation, diarrhea, nausea and vomiting.  Genitourinary: Negative for dysuria, frequency and hematuria.  Musculoskeletal: Negative for back pain, joint pain and neck pain.  Skin:       No acne, rash, or lesions, +right thigh and hip  region exquisite tenderness to touch  Neurological: Negative for dizziness, tremors, focal weakness and weakness.       Confusion  Endo/Heme/Allergies: Negative for polydipsia. Does not bruise/bleed easily.  Psychiatric/Behavioral: Negative for depression. The patient is not nervous/anxious and does not have insomnia.      VITAL SIGNS:   Vitals:   09/12/17 0033 09/12/17 0034 09/12/17 0100 09/12/17 0130  BP:  (!) 183/110 (!) 163/86 (!) 165/87  Pulse:  96 93 86  Resp:  18 (!) 27 19  Temp:  98.6 F (37 C)    TempSrc:  Oral    SpO2:  97% (!) 88% 94%  Weight:  51.3 kg (113 lb)      Wt Readings from Last 3 Encounters:  09/12/17 51.3 kg (113 lb)  09/07/16 54 kg (119 lb)  04/04/15 60.1 kg (132 lb 9.6 oz)    PHYSICAL EXAMINATION:  Physical Exam  Vitals reviewed. Constitutional: She is oriented to person, place, and time. She appears well-developed and well-nourished. No distress.  HENT:  Head: Normocephalic and atraumatic.  Mouth/Throat: Oropharynx is clear and moist.  Eyes: Pupils are equal, round, and reactive to light. Conjunctivae and EOM are normal. No scleral icterus.  Neck: Normal range of motion. Neck supple. No JVD present. No thyromegaly present.  Cardiovascular: Normal rate, regular rhythm and intact distal pulses. Exam reveals no gallop and no friction rub.  No murmur heard. Respiratory: Effort normal and breath sounds normal. No respiratory distress. She has no wheezes. She has no rales.  GI: Soft. Bowel sounds are normal. She exhibits no distension. There is no tenderness.  Musculoskeletal: Normal range of motion. She exhibits tenderness (right thigh and hip region). She exhibits no edema.  No arthritis, no gout  Lymphadenopathy:    She has no cervical adenopathy.  Neurological: She is alert and oriented to person, place, and time. No cranial nerve deficit.  No dysarthria, no aphasia, patient has some mild confusion and forgetfulness.  Skin: Skin is warm and dry. No rash noted. No erythema.  Psychiatric: She has a normal mood and affect. Her behavior is normal. Judgment and thought content normal.    LABORATORY PANEL:   CBC Recent Labs  Lab 09/12/17 0035  WBC 8.0  HGB 12.2  HCT 35.2  PLT 228   ------------------------------------------------------------------------------------------------------------------  Chemistries  Recent Labs  Lab 09/12/17 0035  NA 134*  K 3.1*  CL 97*  CO2 26  GLUCOSE 128*  BUN 19  CREATININE 1.07*  CALCIUM 9.0  AST 38  ALT 24  ALKPHOS 60  BILITOT 0.6    ------------------------------------------------------------------------------------------------------------------  Cardiac Enzymes Recent Labs  Lab 09/12/17 0035  TROPONINI <0.03   ------------------------------------------------------------------------------------------------------------------  RADIOLOGY:  Dg Lumbar Spine Complete  Result Date: 09/10/2017 CLINICAL DATA:  84 year old female with pain right lower back and posterior right hip for 3 weeks. No recent injury. Initial encounter. EXAM: LUMBAR SPINE - COMPLETE 4+ VIEW COMPARISON:  12/21/2013 MR. FINDINGS: Six non rib-bearing lumbar type vertebra. Level assignment on prior MR with last fully open disc space at the L5-S1 level. Scoliosis lumbar spine convex left with superimposed multilevel degenerative changes. Prominent L1-2 and L2-3 disc space narrowing greater on the right. L4-5 facet degenerative changes with mild anterior slip L4. Moderate L4-5 disc space narrowing. Moderate T12-L1 disc space narrowing. No pars defect detected. No acute compression fracture. Prior pinning right hip and left hip replacement. Vascular calcifications. Aorta slightly ectatic. Mitral valve calcifications. IMPRESSION: Scoliosis lumbar spine convex left with superimposed multilevel degenerative  changes. Prominent L1-2 and L2-3 disc space narrowing greater on the right. L4-5 facet degenerative changes with mild anterior slip L4. Moderate L4-5 disc space narrowing. Moderate T12-L1 disc space narrowing. Aortic Atherosclerosis (ICD10-I70.0). Electronically Signed   By: Lacy Duverney M.D.   On: 09/10/2017 18:30   Ct Head Wo Contrast  Result Date: 09/12/2017 CLINICAL DATA:  Patient was diagnosed with shingles on April 5th. Now with increased pain and altered mental status. EXAM: CT HEAD WITHOUT CONTRAST TECHNIQUE: Contiguous axial images were obtained from the base of the skull through the vertex without intravenous contrast. COMPARISON:  None. FINDINGS: Brain:  Diffuse cerebral atrophy. Ventricular dilatation consistent with central atrophy. Low-attenuation changes in the deep white matter consistent with small vessel ischemia. Basal ganglia calcifications. No mass effect or midline shift. No abnormal extra-axial fluid collections. Gray-white matter junctions are distinct. Basal cisterns are not effaced. No acute intracranial hemorrhage. Vascular: Intracranial arterial vascular calcifications are present. Skull: Calvarium appears intact. Sinuses/Orbits: Paranasal sinuses and mastoid air cells are clear. Other: None. IMPRESSION: No acute intracranial abnormalities. Chronic atrophy and small vessel ischemic changes. Electronically Signed   By: Burman Nieves M.D.   On: 09/12/2017 01:37   Dg Chest Port 1 View  Result Date: 09/12/2017 CLINICAL DATA:  Hypoxia and altered mental status. History of shingles diagnosed August 16, 2017. EXAM: PORTABLE CHEST 1 VIEW COMPARISON:  04/04/2015 FINDINGS: Heart is top-normal in size. Moderate superimposed hiatal hernia. Mitral annular calcifications are redemonstrated with aortic atherosclerosis. No aneurysm. Pulmonary hyperinflation without acute pulmonary consolidation. No effusion or pneumothorax. No pulmonary edema. Remote impacted right surgical neck fracture of the humerus. IMPRESSION: 1. No active disease. 2. Moderate-sized hiatal hernia. 3. Aortic atherosclerosis without aneurysm. 4. Remote right impacted surgical neck fracture of the humerus. Electronically Signed   By: Tollie Eth M.D.   On: 09/12/2017 01:20   Dg Hip Unilat With Pelvis 2-3 Views Right  Result Date: 09/10/2017 CLINICAL DATA:  84 year old female with pain right lower back and posterior right hip for 3 weeks. No recent injury. Initial encounter. EXAM: DG HIP (WITH OR WITHOUT PELVIS) 2-3V RIGHT COMPARISON:  Lumbar spine plain film exam same date dictated separately. FINDINGS: Prior pinning right hip. No loosening of pins. No plain film evidence of right femoral  head avascular necrosis. Minimal right hip joint degenerative changes. No fracture or dislocation. Post total left hip replacement. No pelvic fracture noted. Prominent degenerative changes lumbar spine. Please see lumbar spine report. Moderate stool. Vascular calcifications. IMPRESSION: Post right hip pinning and left hip replacement without complication noted. Minimal right hip degenerative changes. Prominent degenerative changes lumbar spine. Please see lumbar spine report. Electronically Signed   By: Lacy Duverney M.D.   On: 09/10/2017 18:33    EKG:   Orders placed or performed during the hospital encounter of 09/12/17  . ED EKG  . ED EKG  . EKG 12-Lead  . EKG 12-Lead    IMPRESSION AND PLAN:  Principal Problem:   UTI (urinary tract infection) - Nitrite positive UA, culture sent, IV rocephin Active Problems:   Acute encephalopathy - due to UTI, monitor for improvement with resolution of infection, see treatment above   Post herpetic neuralgia - topical capsaicin, gabapentin   HTN (hypertension) - continue home meds   GERD - home dose PPI  Chart review performed and case discussed with ED provider. Labs, imaging and/or ECG reviewed by provider and discussed with patient/family. Management plans discussed with the patient and/or family.  DVT PROPHYLAXIS: SubQ lovenox  GI PROPHYLAXIS: PPI  ADMISSION STATUS: Observation  CODE STATUS: Full Code Status History    Date Active Date Inactive Code Status Order ID Comments User Context   04/05/2015 2303 04/08/2015 1505 Full Code [id number]  Donato Heinz, MD Inpatient   04/04/2015 1955 04/05/2015 2303 Full Code [id number]  Enid Baas, MD Inpatient   04/04/2015 1729 04/04/2015 1955 Full Code [id number]  Donato Heinz, MD ED      TOTAL TIME TAKING CARE OF THIS PATIENT: 40 minutes.   Tavi Gaughran FIELDING 09/12/2017, 2:36 AM  [facility]  Office  [phone]  CC: Primary care physician; Corky Downs,  MD  Note:  This document was prepared using Dragon voice recognition software and may include unintentional dictation errors.

## 2017-09-12 NOTE — ED Provider Notes (Signed)
[facility] Emergency Department Provider Note   ____________________________________________   First MD Initiated Contact with Patient 09/12/17 [phone]     (approximate)  I have reviewed the triage vital signs and the nursing notes.   HISTORY  Chief Complaint Altered Mental Status    HPI Angel Simmons is a 84 y.o. female brought to the ED from home by her family with a chief complaint of altered mentation.  Patient was diagnosed with shingles on April 5, took a course of gabapentin, prednisone and acyclovir.  She returned to her PCP this week due to worsening pain in the affected area.  Hip x-rays taken were negative for acute traumatic injuries.  She was placed on a second round of prednisone, and told to increase her oxycodone from 3 times daily to 4 times daily.  Family noted tonight that patient was having spasms of intense pain, seemed disoriented and "talking out of her head".  Denies fever, chills, chest pain, shortness of breath, abdominal pain, nausea, vomiting, dysuria.  Denies recent travel or trauma.   Past Medical History:  Diagnosis Date  . Chronic back pain   . Hip fx (HCC)   . Hypertension   . Rheumatoid arthritis (HCC)   . Spinal stenosis     Patient Active Problem List   Diagnosis Date Noted  . Hip fracture (HCC) 04/04/2015  . Chronic LBP 09/25/2013  . Degeneration of intervertebral disc of lumbar region 09/25/2013  . Neuritis or radiculitis due to rupture of lumbar intervertebral disc 09/25/2013  . Degenerative arthritis of lumbar spine 09/25/2013    Past Surgical History:  Procedure Laterality Date  . CATARACT EXTRACTION, BILATERAL    . CHOLECYSTECTOMY    . HEMORRHOID SURGERY    . HIP ARTHROPLASTY Left 04/05/2015   Procedure: ARTHROPLASTY BIPOLAR HIP (HEMIARTHROPLASTY);  Surgeon: Donato Heinz, MD;  Location: [facility] ORS;  Service: Orthopedics;  Laterality: Left;  . Percutaneous pinning of a right femoral neck fracture       Prior to Admission medications   Medication Sig Start Date End Date Taking? Authorizing Provider  CALCIUM PO Take 1 tablet by mouth 2 (two) times daily.    [provider]  cholecalciferol (VITAMIN D) 1000 units tablet Take 1,000 Units by mouth daily.    [provider]  Cyanocobalamin (VITAMIN B 12 PO) Take 1 tablet by mouth daily.    [provider]  enoxaparin (LOVENOX) 40 MG/0.4ML injection For 14 days then stop Patient not taking: Reported on 09/07/2016 04/08/15   Alford Highland, MD  feeding supplement, ENSURE ENLIVE, (ENSURE ENLIVE) LIQD Take 237 mLs by mouth daily. 04/08/15   Alford Highland, MD  Ferrous Fumarate (HEMOCYTE - 106 MG FE) 324 (106 Fe) MG TABS tablet TK 1 T PO bid 11/15/15   [provider]  ferrous sulfate 325 (65 FE) MG tablet Take 1 tablet (325 mg total) by mouth 2 (two) times daily with a meal. Patient not taking: Reported on 09/07/2016 04/08/15   Alford Highland, MD  lisinopril (PRINIVIL,ZESTRIL) 10 MG tablet Take 10 mg by mouth daily.     [provider]  memantine (NAMENDA) 5 MG tablet Take 5 mg by mouth every evening.    [provider]  Multiple Vitamin (MULTIVITAMIN) tablet Take 1 tablet by mouth daily.    [provider]  oxyCODONE (ROXICODONE) 5 MG immediate release tablet Take 1 tablet (5 mg total) by mouth every 8 (eight) hours as needed. 09/07/16   Ileana Roup  A, MD  pantoprazole (PROTONIX) 40 MG tablet TK 1 T PO ONCE D 11/15/15   [provider]  senna-docusate (SENOKOT-S) 8.6-50 MG tablet Take 1 tablet by mouth 2 (two) times daily. Patient not taking: Reported on 09/07/2016 04/08/15   Alford Highland, MD    Allergies Tylenol [acetaminophen]  Family History  Problem Relation Age of Onset  . Breast cancer Mother   . Dementia Father     Social History Social History   Tobacco Use  . Smoking status: Never Smoker  . Smokeless tobacco: Never Used  Substance Use Topics  .  Alcohol use: No  . Drug use: No    Review of Systems  Constitutional: No fever/chills. Eyes: No visual changes. ENT: No sore throat. Cardiovascular: Denies chest pain. Respiratory: Denies shortness of breath. Gastrointestinal: No abdominal pain.  No nausea, no vomiting.  No diarrhea.  No constipation. Genitourinary: Negative for dysuria. Musculoskeletal: Negative for back pain. Skin: Positive for painful rash. Neurological: Positive for altered mental status.  Negative for headaches, focal weakness or numbness.   ____________________________________________   PHYSICAL EXAM:  VITAL SIGNS: ED Triage Vitals  Enc Vitals Group     BP 09/12/17 0034 (!) 183/110     Pulse Rate 09/12/17 0034 96     Resp 09/12/17 0034 18     Temp 09/12/17 0034 98.6 F (37 C)     Temp Source 09/12/17 0034 Oral     SpO2 09/12/17 0034 97 %     Weight 09/12/17 0033 113 lb (51.3 kg)     Height --      Head Circumference --      Peak Flow --      Pain Score --      Pain Loc --      Pain Edu? --      Excl. in GC? --     Constitutional: Alert and oriented.  Frail appearing and in mild acute distress. Eyes: Conjunctivae are normal. PERRL. EOMI. Head: Atraumatic. Nose: No congestion/rhinnorhea. Mouth/Throat: Mucous membranes are moist.  Oropharynx non-erythematous. Neck: No stridor.  Supple neck without meningismus. Cardiovascular: Normal rate, regular rhythm. Grossly normal heart sounds.  Good peripheral circulation. Respiratory: Normal respiratory effort.  No retractions. Lungs CTAB. Gastrointestinal: Soft and nontender. No distention. No abdominal bruits. No CVA tenderness. Musculoskeletal: No lower extremity tenderness nor edema.  No joint effusions. Neurologic: Alert and oriented to person and place.  Normal speech and language. No gross focal neurologic deficits are appreciated. MAEx4. Skin:  Skin is warm, dry and intact.  Dried rash of shingles to right buttock, groin and vaginal area.  Area  is very painful to the touch.  No evidence of active infection. Psychiatric: Mood and affect are normal. Speech and behavior are normal.  ____________________________________________   LABS (all labs ordered are listed, but only abnormal results are displayed)  Labs Reviewed  COMPREHENSIVE METABOLIC PANEL - Abnormal; Notable for the following components:      Result Value   Sodium 134 (*)    Potassium 3.1 (*)    Chloride 97 (*)    Glucose, Bld 128 (*)    Creatinine, Ser 1.07 (*)    GFR calc non Af Amer 45 (*)    GFR calc Af Amer 53 (*)    All other components within normal limits  CBC - Abnormal; Notable for the following components:   RBC 3.73 (*)    All other components within normal limits  URINALYSIS, COMPLETE (UACMP) WITH MICROSCOPIC -  Abnormal; Notable for the following components:   Color, Urine YELLOW (*)    APPearance HAZY (*)    Specific Gravity, Urine 1.004 (*)    Nitrite POSITIVE (*)    Leukocytes, UA LARGE (*)    All other components within normal limits  TROPONIN I  CBG MONITORING, ED   ____________________________________________  EKG  ED ECG REPORT I, SUNG,JADE J, the attending physician, personally viewed and interpreted this ECG.   Date: 09/12/2017  EKG Time: 0037  Rate: 93  Rhythm: normal EKG, normal sinus rhythm  Axis: LAD  Intervals:nonspecific intraventricular conduction delay  ST&T Change: Nonspecific  ____________________________________________  RADIOLOGY  ED MD interpretation: No active cardiopulmonary process  Official radiology report(s): Dg Chest Port 1 View  Result Date: 09/12/2017 CLINICAL DATA:  Hypoxia and altered mental status. History of shingles diagnosed August 16, 2017. EXAM: PORTABLE CHEST 1 VIEW COMPARISON:  04/04/2015 FINDINGS: Heart is top-normal in size. Moderate superimposed hiatal hernia. Mitral annular calcifications are redemonstrated with aortic atherosclerosis. No aneurysm. Pulmonary hyperinflation without acute  pulmonary consolidation. No effusion or pneumothorax. No pulmonary edema. Remote impacted right surgical neck fracture of the humerus. IMPRESSION: 1. No active disease. 2. Moderate-sized hiatal hernia. 3. Aortic atherosclerosis without aneurysm. 4. Remote right impacted surgical neck fracture of the humerus. Electronically Signed   By: Tollie Eth M.D.   On: 09/12/2017 01:20    ____________________________________________   PROCEDURES  Procedure(s) performed: None  Procedures  Critical Care performed: No  ____________________________________________   INITIAL IMPRESSION / ASSESSMENT AND PLAN / ED COURSE  As part of my medical decision making, I reviewed the following data within the electronic MEDICAL RECORD NUMBER History obtained from family, Nursing notes reviewed and incorporated, Labs reviewed, EKG interpreted, Old chart reviewed, Radiograph reviewed, Discussed with admitting physician and Notes from prior ED visits   84 year old female with recent shingles currently on prednisone and increased oxycodone who presents with altered mentation and neuropathic pain.  Differential diagnosis includes but is not limited to adverse reaction to medicine, altered mentation secondary to prednisone, UTI, CAD, etc.   Laboratory results remarkable for hypokalemia, UTI.  Patient's room air saturations 88%.  She was placed on 2 L nasal cannula which brings her up to 96%.  Hypoxia is most likely secondary to oversedation from narcotic pain medications.  Will infuse IV fluids, IV Rocephin for UTI, replete potassium.  Will discuss with hospitalist to evaluate patient in the emergency department for admission.      ____________________________________________   FINAL CLINICAL IMPRESSION(S) / ED DIAGNOSES  Final diagnoses:  Altered mental status, unspecified altered mental status type  Hypoxia  Urinary tract infection without hematuria, site unspecified  Narcotic overdose, accidental or  unintentional, initial encounter [facility])  Hypokalemia     ED Discharge Orders    None       Note:  This document was prepared using Dragon voice recognition software and may include unintentional dictation errors.    Irean Hong, MD 09/12/17 [phone]

## 2017-09-12 NOTE — Evaluation (Signed)
Physical Therapy Evaluation Patient Details Name: Angel Simmons MRN: [id number] DOB: Dec 31, 1928 Today's Date: 09/12/2017   History of Present Illness  [name], HTN, [name], however is having difficulty ambulating.  Clinical Impression  Pt is a pleasant 84 year old female who was admitted for UTI with confusion. Pt performs transfers with mod I and ambulation with cga and SPC. Further ambulation bout with RW. Educated on use for balance and pain relief due to hip pain. Pt with improved balance using RW. Pt demonstrates deficits with pain/balance. Pt seems motivated to return back to her active lifestyle. Would benefit from skilled PT to address above deficits and promote optimal return to PLOF. Recommend transition to HHPT upon discharge from acute hospitalization.       Follow Up Recommendations Home health PT    Equipment Recommendations  Rolling walker with 5" wheels    Recommendations for Other Services       Precautions / Restrictions Precautions Precautions: Fall Restrictions Weight Bearing Restrictions: No      Mobility  Bed Mobility               General bed mobility comments: not performed as pt received in recliner  Transfers Overall transfer level: Modified independent Equipment used: Straight cane             General transfer comment: safe technique with SPC. Guarding noted on R hip  Ambulation/Gait Ambulation/Gait assistance: Min guard Ambulation Distance (Feet): 40 Feet Assistive device: Straight cane Gait Pattern/deviations: Step-to pattern     General Gait Details: antalgic gait pattern performed using SPC. Pt with decreased step length and increased sway. Further ambulation performed using RW  Stairs            Wheelchair Mobility    Modified Rankin (Stroke Patients Only)       Balance  Overall balance assessment: Needs assistance Sitting-balance support: Feet supported Sitting balance-Leahy Scale: Good     Standing balance support: Single extremity supported Standing balance-Leahy Scale: Fair                               Pertinent Vitals/Pain Pain Assessment: 0-10 Pain Score: 4  Pain Location: R hip Pain Descriptors / Indicators: Aching;Discomfort Pain Intervention(s): Limited activity within patient's tolerance    Home Living Family/patient expects to be discharged to:: Private residence Living Arrangements: Alone Available Help at Discharge: Neighbor Type of Home: Apartment Home Access: Level entry     Home Layout: One level Home Equipment: Environmental consultant - 4 wheels;Cane - single point      Prior Function Level of Independence: Independent with assistive device(s)         Comments: uses SPC for all mobility, has rollater to use if needed     Hand Dominance        Extremity/Trunk Assessment   Upper Extremity Assessment Upper Extremity Assessment: Overall WFL for tasks assessed    Lower Extremity Assessment Lower Extremity Assessment: Overall WFL for tasks assessed       Communication   Communication: No difficulties  Cognition Arousal/Alertness: Awake/alert Behavior During Therapy: WFL for tasks assessed/performed Overall Cognitive Status: Within Functional Limits for tasks assessed  General Comments      Exercises Other Exercises Other Exercises: Further ambulation performed using RW. Therapist demonstrated and adjusted to pt height. Improvement noted in ambulation using AD, able to ambulate additional 55' with supervision and safe technique. Reports decreased pain with RW.   Assessment/Plan    PT Assessment Patient needs continued PT services  PT Problem List Decreased balance;Decreased mobility;Pain;Decreased knowledge of use of DME       PT Treatment  Interventions Gait training;Therapeutic exercise;Balance training    PT Goals (Current goals can be found in the Care Plan section)  Acute Rehab PT Goals Patient Stated Goal: to return home PT Goal Formulation: With patient Time For Goal Achievement: 09/26/17 Potential to Achieve Goals: Good    Frequency Min 2X/week   Barriers to discharge        Co-evaluation               AM-PAC PT "6 Clicks" Daily Activity  Outcome Measure Difficulty turning over in bed (including adjusting bedclothes, sheets and blankets)?: None Difficulty moving from lying on back to sitting on the side of the bed? : None Difficulty sitting down on and standing up from a chair with arms (e.g., wheelchair, bedside commode, etc,.)?: None Help needed moving to and from a bed to chair (including a wheelchair)?: A Little Help needed walking in hospital room?: A Little Help needed climbing 3-5 steps with a railing? : A Little 6 Click Score: 21    End of Session Equipment Utilized During Treatment: Gait belt Activity Tolerance: Patient limited by pain Patient left: in chair;with nursing/sitter in room;with family/visitor present Nurse Communication: Mobility status PT Visit Diagnosis: Unsteadiness on feet (R26.81);Muscle weakness (generalized) (M62.81);Difficulty in walking, not elsewhere classified (R26.2);Pain Pain - Right/Left: Right Pain - part of body: Hip    Time: 1150-1209 PT Time Calculation (min) (ACUTE ONLY): 19 min   Charges:   PT Evaluation $PT Eval Low Complexity: 1 Low PT Treatments $Gait Training: 8-22 mins   PT G CodesElizabeth Simmons, PT, DPT [phone]   Angel Simmons 09/12/2017, 4:29 PM

## 2017-09-13 DIAGNOSIS — N390 Urinary tract infection, site not specified: Secondary | ICD-10-CM | POA: Diagnosis not present

## 2017-09-13 MED ORDER — CEPHALEXIN 500 MG PO CAPS: 500.0000 mg | ORAL_CAPSULE | Freq: Two times a day (BID) | ORAL | Status: DC

## 2017-09-13 MED ORDER — TRAZODONE HCL 50 MG PO TABS: 25.0000 mg | ORAL_TABLET | Freq: Every evening | ORAL | 0 refills | Status: DC | PRN

## 2017-09-13 MED ORDER — CEPHALEXIN 500 MG PO CAPS
500.0000 mg | ORAL_CAPSULE | Freq: Two times a day (BID) | ORAL | 0 refills | Status: DC
Start: 2017-09-13 — End: 2017-09-19

## 2017-09-13 MED ORDER — CAPSAICIN 0.025 % EX CREA: TOPICAL_CREAM | Freq: Two times a day (BID) | CUTANEOUS | 0 refills | Status: DC

## 2017-09-13 MED ORDER — LIDOCAINE 5 % EX PTCH: 1.0000 | MEDICATED_PATCH | CUTANEOUS | 0 refills | Status: DC

## 2017-09-13 MED ORDER — TRAZODONE HCL 50 MG PO TABS: 25.0000 mg | ORAL_TABLET | Freq: Every evening | ORAL | Status: DC | PRN

## 2017-09-13 MED ORDER — OXYCODONE HCL 5 MG PO TABS: 5.0000 mg | ORAL_TABLET | Freq: Four times a day (QID) | ORAL | 0 refills | Status: DC | PRN

## 2017-09-13 MED ORDER — GABAPENTIN 100 MG PO CAPS: 200.0000 mg | ORAL_CAPSULE | Freq: Three times a day (TID) | ORAL | 0 refills | Status: DC

## 2017-09-13 NOTE — Progress Notes (Signed)
Patient discharged with daughter, verbalized understanding of education. Patient referred to pain clinic by Dr. Allena Katz. Patient with no complaints.

## 2017-09-13 NOTE — Progress Notes (Signed)
Patient screaming and yelling complaining of pain 10/10 in her right hip. Administered prn oxycodone at 0851, notified Dr. Allena Katz. Requested x ray for right hip, Dr. Allena Katz does not want an x ray taken stating the pain is from the shingles. Patient stating oxycodone ineffective, administered prn morphine 2mg  at 0932 for 10/10 pain in right hip.

## 2017-09-13 NOTE — Progress Notes (Signed)
Pharmacy Antibiotic Note  Angel Simmons is a 84 y.o. female admitted on 09/12/2017 with UTI.  Pharmacy has been consulted for ceftriaxone dosing.  Plan: Ceftriaxone 1 grams q 24 hours ordered.  5/3 Spoke with Lab - they had not been running UCx - Ship broker. I prompted them to run UCx. Lab tech reports they still have sample and will start UCx. Continue Rocephin 1 gm IV Q24H - NAC  Height: 5\' 4"  (162.6 cm) Weight: 116 lb 6.4 oz (52.8 kg) IBW/kg (Calculated) : 54.7  Temp (24hrs), Avg:98 F (36.7 C), Min:97.9 F (36.6 C), Max:98.1 F (36.7 C)  Recent Labs  Lab 09/12/17 0035 09/12/17 0137  WBC 8.0  --   CREATININE 1.07*  --   LATICACIDVEN  --  0.9    Estimated Creatinine Clearance: 30.9 mL/min (A) (by C-G formula based on SCr of 1.07 mg/dL (H)).    Allergies  Allergen Reactions  . Tylenol [Acetaminophen] Other (See Comments)    Reaction:  Headaches     Antimicrobials this admission: Ceftriaxone 5/1  >>    >>   Dose adjustments this admission:   Microbiology results: 5/2 BCx: pending 5/2 UCx: pending       5/3 UA: LE(+) NO2(+) Thank you for allowing pharmacy to be a part of this patient's care.  7/2 09/13/2017 8:03 AM

## 2017-09-13 NOTE — Progress Notes (Signed)
Patient moaning and yelling in pain 10/10 after morphine. Encouraging patient to take deep breaths and utilizing relaxing techniques.

## 2017-09-13 NOTE — Discharge Summary (Signed)
[facility] [place] [facility]   PATIENT NAME: Angel Simmons    MR#:  [id number]  DATE OF BIRTH:  January 23, 1929  DATE OF ADMISSION:  09/12/2017 ADMITTING PHYSICIAN: Oralia Manis, MD  DATE OF DISCHARGE: 09/13/2017  PRIMARY CARE PHYSICIAN: Corky Downs, MD    ADMISSION DIAGNOSIS:  Hypokalemia [E87.6] Hypoxia [R09.02] Narcotic overdose, accidental or unintentional, initial encounter (HCC) [T40.601A] Urinary tract infection without hematuria, site unspecified [N39.0] Altered mental status, unspecified altered mental status type [R41.82]  DISCHARGE DIAGNOSIS:  acute encephalopathy secondary to UTI postherpetic right hip neurologic pain  SECONDARY DIAGNOSIS:   Past Medical History:  Diagnosis Date  . Chronic back pain   . Hip fx (HCC)   . Hypertension   . Rheumatoid arthritis (HCC)   . Spinal stenosis     HOSPITAL COURSE:   [name] with post herpetic neuralgia for the past three weeks, and acute onset of confusion over the last day. Has been treated in outpatient setting for her zoster outbreak a month ago with good resolution of lesions, but persistent pain. Patient came in with some confusion.   *UTI (urinary tract infection) - Nitrite positive UA, culture sent - IV rocephin--change to po kelfex -afebrile -BC negative  *Acute encephalopathy - due to UTI, monitor for improvement with resolution of infection, see treatment above  *Post herpetic neuralgia - topical capsaicin, gabapentin -lidocaine patch -no active lesions. DC isolation. This was discussed with Sarah wall infectious control nurse. -Patient has intermittent sharp electrical pain in her right hip which is been delayed for last three weeks according to the daughters. -I increased her gabapentin to 200 mg TID, lidocaine patch local, continue oxycodone Q6 PRN, trazodone small dose for nighttime sleep as needed. -Called [facility] and  discussed the case. Staff will call next week to her daughter and make appointment to follow up with pain clinic. -Dter. aware of above.  *HTN (hypertension) - continue home meds  *GERD - home dose PPI  PT recommends home health PT which will be set up.  D/w dter in the room  D/c home CONSULTS OBTAINED:    DRUG ALLERGIES:   Allergies  Allergen Reactions  . Tylenol [Acetaminophen] Other (See Comments)    Reaction:  Headaches     DISCHARGE MEDICATIONS:   Allergies as of 09/13/2017      Reactions   Tylenol [acetaminophen] Other (See Comments)   Reaction:  Headaches       Medication List    TAKE these medications   CALCIUM 1000 + D 1000-800 MG-UNIT Tabs Generic drug:  Calcium Carb-Cholecalciferol Take 1 tablet by mouth daily.   capsaicin 0.025 % cream Commonly known as:  ZOSTRIX Apply topically 2 (two) times daily.   cephALEXin 500 MG capsule Commonly known as:  KEFLEX Take 1 capsule (500 mg total) by mouth every 12 (twelve) hours.   DSS 100 MG Caps Take 1 capsule by mouth daily.   Ferrous Fumarate 324 (106 Fe) MG Tabs tablet Commonly known as:  HEMOCYTE - 106 mg FE Take 1 tablet by mouth 2 (two) times daily.   gabapentin 100 MG capsule Commonly known as:  NEURONTIN Take 2 capsules (200 mg total) by mouth 3 (three) times daily. What changed:  how much to take   lidocaine 5 % Commonly known as:  LIDODERM Place 1 patch onto the skin daily. Remove & Discard patch within 12 hours or as directed by MD Start taking on:  09/14/2017  lisinopril 20 MG tablet Commonly known as:  PRINIVIL,ZESTRIL Take 20 mg by mouth daily.   memantine 5 MG tablet Commonly known as:  NAMENDA Take 2.5-5 mg by mouth 2 (two) times daily. 1/2 tablet in the morning and 1 tablet in the evening   multivitamin capsule Take 1 capsule by mouth daily.   oxyCODONE 5 MG immediate release tablet Commonly known as:  Oxy IR/ROXICODONE Take 1 tablet (5 mg total) by mouth every 6 (six)  hours as needed for breakthrough pain. What changed:    how much to take  when to take this  reasons to take this   pantoprazole 40 MG tablet Commonly known as:  PROTONIX Take 40 mg by mouth daily.   PRESERVISION AREDS 2+MULTI VIT PO Take 1 tablet by mouth daily.   traZODone 50 MG tablet Commonly known as:  DESYREL Take 0.5 tablets (25 mg total) by mouth at bedtime as needed for sleep.   Vitamin D3 2000 units capsule Take 4,000 Units by mouth daily.       If you experience worsening of your admission symptoms, develop shortness of breath, life threatening emergency, suicidal or homicidal thoughts you must seek medical attention immediately by calling 911 or calling your MD immediately  if symptoms less severe.  You Must read complete instructions/literature along with all the possible adverse reactions/side effects for all the Medicines you take and that have been prescribed to you. Take any new Medicines after you have completely understood and accept all the possible adverse reactions/side effects.   Please note  You were cared for by a hospitalist during your hospital stay. If you have any questions about your discharge medications or the care you received while you were in the hospital after you are discharged, you can call the unit and asked to speak with the hospitalist on call if the hospitalist that took care of you is not available. Once you are discharged, your primary care physician will handle any further medical issues. Please note that NO REFILLS for any discharge medications will be authorized once you are discharged, as it is imperative that you return to your primary care physician (or establish a relationship with a primary care physician if you do not have one) for your aftercare needs so that they can reassess your need for medications and monitor your lab values. Today   SUBJECTIVE    Some  Episodes of pain VITAL SIGNS:  Blood pressure (!) 151/89, pulse  91, temperature 98 F (36.7 C), temperature source Oral, resp. rate 16, height 5\' 4"  (1.626 m), weight 52.8 kg (116 lb 6.4 oz), SpO2 98 %.  I/O:    Intake/Output Summary (Last 24 hours) at 09/13/2017 1254 Last data filed at 09/13/2017 0659 Gross per 24 hour  Intake 580 ml  Output -  Net 580 ml    PHYSICAL EXAMINATION:  GENERAL:  84 y.o.-year-old patient lying in the bed with no acute distress.  EYES: Pupils equal, round, reactive to light and accommodation. No scleral icterus. Extraocular muscles intact.  HEENT: Head atraumatic, normocephalic. Oropharynx and nasopharynx clear.  NECK:  Supple, no jugular venous distention. No thyroid enlargement, no tenderness.  LUNGS: Normal breath sounds bilaterally, no wheezing, rales,rhonchi or crepitation. No use of accessory muscles of respiration.  CARDIOVASCULAR: S1, S2 normal. No murmurs, rubs, or gallops.  ABDOMEN: Soft, non-tender, non-distended. Bowel sounds present. No organomegaly or mass.  EXTREMITIES: No pedal edema, cyanosis, or clubbing.  NEUROLOGIC: Cranial nerves II through XII are intact. Muscle  strength 4/5 in all extremities. Sensation intact. Gait not checked.  PSYCHIATRIC: The patient is alert and awake.  SKIN: dried herpes lesions right buttock  DATA REVIEW:   CBC  Recent Labs  Lab 09/12/17 0035  WBC 8.0  HGB 12.2  HCT 35.2  PLT 228    Chemistries  Recent Labs  Lab 09/12/17 0035  NA 134*  K 3.1*  CL 97*  CO2 26  GLUCOSE 128*  BUN 19  CREATININE 1.07*  CALCIUM 9.0  AST 38  ALT 24  ALKPHOS 60  BILITOT 0.6    Microbiology Results   Recent Results (from the past 240 hour(s))  Culture, blood (routine x 2)     Status: None (Preliminary result)   Collection Time: 09/12/17  1:37 AM  Result Value Ref Range Status   Specimen Description BLOOD RIGHT FA  Final   Special Requests   Final    BOTTLES DRAWN AEROBIC AND ANAEROBIC Blood Culture adequate volume   Culture   Final    NO GROWTH 1 DAY Performed at  [facility], [address] [place], [place] [address]    Report Status PENDING  Incomplete  Culture, blood (routine x 2)     Status: None (Preliminary result)   Collection Time: 09/12/17  1:37 AM  Result Value Ref Range Status   Specimen Description BLOOD LEFT FA  Final   Special Requests   Final    BOTTLES DRAWN AEROBIC AND ANAEROBIC Blood Culture adequate volume   Culture   Final    NO GROWTH 1 DAY Performed at [facility], [address], [place], [place] [address]    Report Status PENDING  Incomplete    RADIOLOGY:  Ct Head Wo Contrast  Result Date: 09/12/2017 CLINICAL DATA:  Patient was diagnosed with shingles on April 5th. Now with increased pain and altered mental status. EXAM: CT HEAD WITHOUT CONTRAST TECHNIQUE: Contiguous axial images were obtained from the base of the skull through the vertex without intravenous contrast. COMPARISON:  None. FINDINGS: Brain: Diffuse cerebral atrophy. Ventricular dilatation consistent with central atrophy. Low-attenuation changes in the deep white matter consistent with small vessel ischemia. Basal ganglia calcifications. No mass effect or midline shift. No abnormal extra-axial fluid collections. Gray-white matter junctions are distinct. Basal cisterns are not effaced. No acute intracranial hemorrhage. Vascular: Intracranial arterial vascular calcifications are present. Skull: Calvarium appears intact. Sinuses/Orbits: Paranasal sinuses and mastoid air cells are clear. Other: None. IMPRESSION: No acute intracranial abnormalities. Chronic atrophy and small vessel ischemic changes. Electronically Signed   By: Burman Nieves M.D.   On: 09/12/2017 01:37   Dg Chest Port 1 View  Result Date: 09/12/2017 CLINICAL DATA:  Hypoxia and altered mental status. History of shingles diagnosed August 16, 2017. EXAM: PORTABLE CHEST 1 VIEW COMPARISON:  04/04/2015 FINDINGS: Heart is top-normal in size. Moderate superimposed hiatal hernia. Mitral annular  calcifications are redemonstrated with aortic atherosclerosis. No aneurysm. Pulmonary hyperinflation without acute pulmonary consolidation. No effusion or pneumothorax. No pulmonary edema. Remote impacted right surgical neck fracture of the humerus. IMPRESSION: 1. No active disease. 2. Moderate-sized hiatal hernia. 3. Aortic atherosclerosis without aneurysm. 4. Remote right impacted surgical neck fracture of the humerus. Electronically Signed   By: Tollie Eth M.D.   On: 09/12/2017 01:20     Management plans discussed with the patient, family and they are in agreement.  CODE STATUS:     Code Status Orders  (From admission, onward)        Start  Ordered   09/12/17 0422  Full code  Continuous     09/12/17 0421    Code Status History    Date Active Date Inactive Code Status Order ID Comments User Context   04/05/2015 2303 04/08/2015 1505 Full Code [id number]  Donato Heinz, MD Inpatient   04/04/2015 1955 04/05/2015 2303 Full Code [id number]  Enid Baas, MD Inpatient   04/04/2015 1729 04/04/2015 1955 Full Code [id number]  Donato Heinz, MD ED    Advance Directive Documentation     Most Recent Value  Type of Advance Directive  Healthcare Power of Attorney, Living will  Pre-existing out of facility DNR order (yellow form or pink MOST form)  -  "MOST" Form in Place?  -      TOTAL TIME TAKING CARE OF THIS PATIENT: 40** minutes.    Enedina Finner M.D on 09/13/2017 at 12:54 PM  Between 7am to 6pm - Pager - [phone] After 6pm go to [email or web address] - Social research officer, government  [facility]  Office  [phone]  CC: Primary care physician; Corky Downs, MD

## 2017-09-13 NOTE — Care Management (Signed)
RNCM spoke with Becky Sax with [facility] home health.  They will see patient tomorrow.

## 2017-09-13 NOTE — Plan of Care (Signed)
  Problem: Urinary Elimination: Goal: Signs and symptoms of infection will decrease Outcome: Progressing   Problem: Clinical Measurements: Goal: Will remain free from infection Outcome: Progressing Goal: Diagnostic test results will improve Outcome: Progressing   Problem: Activity: Goal: Risk for activity intolerance will decrease Outcome: Progressing   Problem: Pain Managment: Goal: General experience of comfort will improve Outcome: Progressing   Problem: Safety: Goal: Ability to remain free from injury will improve Outcome: Progressing   Problem: Skin Integrity: Goal: Risk for impaired skin integrity will decrease Outcome: Progressing

## 2017-09-16 LAB — URINE CULTURE: Culture: >=100000 — AB

## 2017-09-17 LAB — CULTURE, BLOOD (ROUTINE X 2)
CULTURE: NO GROWTH
CULTURE: NO GROWTH
SPECIAL REQUESTS: ADEQUATE
SPECIAL REQUESTS: ADEQUATE

## 2017-09-19 ENCOUNTER — Encounter: Payer: Self-pay | Admitting: Pain Medicine

## 2017-09-19 ENCOUNTER — Ambulatory Visit (HOSPITAL_BASED_OUTPATIENT_CLINIC_OR_DEPARTMENT_OTHER): Payer: Medicare Other | Admitting: Pain Medicine

## 2017-09-19 ENCOUNTER — Other Ambulatory Visit: Payer: Self-pay

## 2017-09-19 ENCOUNTER — Ambulatory Visit
Admission: RE | Admit: 2017-09-19 | Discharge: 2017-09-19 | Disposition: A | Discharge status: Home / Self Care | Payer: Medicare Other | Source: Ambulatory Visit | Attending: Pain Medicine | Admitting: Pain Medicine

## 2017-09-19 VITALS — BP 180/93 | HR 85 | Temp 97.9°F | Resp 20 | Ht 65.0 in | Wt 113.0 lb

## 2017-09-19 DIAGNOSIS — I10 Essential (primary) hypertension: Secondary | ICD-10-CM | POA: Insufficient documentation

## 2017-09-19 DIAGNOSIS — K219 Gastro-esophageal reflux disease without esophagitis: Secondary | ICD-10-CM | POA: Insufficient documentation

## 2017-09-19 DIAGNOSIS — M5416 Radiculopathy, lumbar region: Secondary | ICD-10-CM

## 2017-09-19 DIAGNOSIS — B029 Zoster without complications: Secondary | ICD-10-CM | POA: Diagnosis not present

## 2017-09-19 DIAGNOSIS — M5116 Intervertebral disc disorders with radiculopathy, lumbar region: Secondary | ICD-10-CM | POA: Insufficient documentation

## 2017-09-19 DIAGNOSIS — B0229 Other postherpetic nervous system involvement: Secondary | ICD-10-CM

## 2017-09-19 DIAGNOSIS — M069 Rheumatoid arthritis, unspecified: Secondary | ICD-10-CM | POA: Diagnosis not present

## 2017-09-19 DIAGNOSIS — M47816 Spondylosis without myelopathy or radiculopathy, lumbar region: Secondary | ICD-10-CM | POA: Insufficient documentation

## 2017-09-19 DIAGNOSIS — R937 Abnormal findings on diagnostic imaging of other parts of musculoskeletal system: Secondary | ICD-10-CM | POA: Insufficient documentation

## 2017-09-19 DIAGNOSIS — M25551 Pain in right hip: Secondary | ICD-10-CM | POA: Diagnosis present

## 2017-09-19 DIAGNOSIS — M47817 Spondylosis without myelopathy or radiculopathy, lumbosacral region: Secondary | ICD-10-CM | POA: Insufficient documentation

## 2017-09-19 DIAGNOSIS — B028 Zoster with other complications: Secondary | ICD-10-CM | POA: Diagnosis not present

## 2017-09-19 MED ORDER — ROPIVACAINE HCL 2 MG/ML IJ SOLN
2.0000 mL | Freq: Once | INTRAMUSCULAR | Status: AC
  Administered 2017-09-19: 10 mL via EPIDURAL

## 2017-09-19 MED ORDER — TRIAMCINOLONE ACETONIDE 40 MG/ML IJ SUSP
40.0000 mg | Freq: Once | INTRAMUSCULAR | Status: AC
  Administered 2017-09-19: 40 mg

## 2017-09-19 MED ORDER — SODIUM CHLORIDE 0.9% FLUSH
2.0000 mL | Freq: Once | INTRAVENOUS | Status: AC
  Administered 2017-09-19: 10 mL

## 2017-09-19 MED ORDER — SODIUM CHLORIDE 0.9 % IJ SOLN
INTRAMUSCULAR | Status: AC
  Filled 2017-09-19: qty 10

## 2017-09-19 MED ORDER — TRIAMCINOLONE ACETONIDE 40 MG/ML IJ SUSP
40.0000 mg | Freq: Once | INTRAMUSCULAR | Status: AC
  Administered 2017-09-19: 40 mg
  Filled 2017-09-19: qty 1

## 2017-09-19 MED ORDER — LIDOCAINE HCL 2 % IJ SOLN
INTRAMUSCULAR | Status: AC
  Filled 2017-09-19: qty 20

## 2017-09-19 MED ORDER — TRIAMCINOLONE ACETONIDE 40 MG/ML IJ SUSP
INTRAMUSCULAR | Status: AC
  Filled 2017-09-19: qty 1

## 2017-09-19 MED ORDER — LIDOCAINE HCL 2 % IJ SOLN
20.0000 mL | Freq: Once | INTRAMUSCULAR | Status: AC
  Administered 2017-09-19: 400 mg

## 2017-09-19 MED ORDER — ROPIVACAINE HCL 2 MG/ML IJ SOLN
INTRAMUSCULAR | Status: AC
  Filled 2017-09-19: qty 10

## 2017-09-19 MED ORDER — LIDOCAINE HCL (PF) 1 % IJ SOLN
INTRAMUSCULAR | Status: AC
  Filled 2017-09-19: qty 5

## 2017-09-19 MED ORDER — IOPAMIDOL (ISOVUE-M 200) INJECTION 41%
10.0000 mL | Freq: Once | INTRAMUSCULAR | Status: AC
  Administered 2017-09-19: 10 mL via EPIDURAL

## 2017-09-19 MED ORDER — IOPAMIDOL (ISOVUE-M 200) INJECTION 41%
INTRAMUSCULAR | Status: AC
  Filled 2017-09-19: qty 10

## 2017-09-19 NOTE — Patient Instructions (Signed)
____________________________________________________________________________________________  Pain Scale  Introduction: The pain score used by this practice is the Verbal Numerical Rating Scale (VNRS-11). This is an 11-point scale. It is for adults and children 10 years or older. There are significant differences in how the pain score is reported, used, and applied. Forget everything you learned in the past and learn this scoring system.  General Information: The scale should reflect your current level of pain. Unless you are specifically asked for the level of your worst pain, or your average pain. If you are asked for one of these two, then it should be understood that it is over the past 24 hours.  Basic Activities of Daily Living (ADL): Personal hygiene, dressing, eating, transferring, and using restroom.  Instructions: Most patients tend to report their level of pain as a combination of two factors, their physical pain and their psychosocial pain. This last one is also known as "suffering" and it is reflection of how physical pain affects you socially and psychologically. From now on, report them separately. From this point on, when asked to report your pain level, report only your physical pain. Use the following table for reference.  Pain Clinic Pain Levels (0-5/10)  Pain Level Score  Description  No Pain 0   Mild pain 1 Nagging, annoying, but does not interfere with basic activities of daily living (ADL). Patients are able to eat, bathe, get dressed, toileting (being able to get on and off the toilet and perform personal hygiene functions), transfer (move in and out of bed or a chair without assistance), and maintain continence (able to control bladder and bowel functions). Blood pressure and heart rate are unaffected. A normal heart rate for a healthy adult ranges from 60 to 100 bpm (beats per minute).   Mild to moderate pain 2 Noticeable and distracting. Impossible to hide from other  people. More frequent flare-ups. Still possible to adapt and function close to normal. It can be very annoying and may have occasional stronger flare-ups. With discipline, patients may get used to it and adapt.   Moderate pain 3 Interferes significantly with activities of daily living (ADL). It becomes difficult to feed, bathe, get dressed, get on and off the toilet or to perform personal hygiene functions. Difficult to get in and out of bed or a chair without assistance. Very distracting. With effort, it can be ignored when deeply involved in activities.   Moderately severe pain 4 Impossible to ignore for more than a few minutes. With effort, patients may still be able to manage work or participate in some social activities. Very difficult to concentrate. Signs of autonomic nervous system discharge are evident: dilated pupils (mydriasis); mild sweating (diaphoresis); sleep interference. Heart rate becomes elevated (>115 bpm). Diastolic blood pressure (lower number) rises above 100 mmHg. Patients find relief in laying down and not moving.   Severe pain 5 Intense and extremely unpleasant. Associated with frowning face and frequent crying. Pain overwhelms the senses.  Ability to do any activity or maintain social relationships becomes significantly limited. Conversation becomes difficult. Pacing back and forth is common, as getting into a comfortable position is nearly impossible. Pain wakes you up from deep sleep. Physical signs will be obvious: pupillary dilation; increased sweating; goosebumps; brisk reflexes; cold, clammy hands and feet; nausea, vomiting or dry heaves; loss of appetite; significant sleep disturbance with inability to fall asleep or to remain asleep. When persistent, significant weight loss is observed due to the complete loss of appetite and sleep deprivation.  Blood   pressure and heart rate becomes significantly elevated. Caution: If elevated blood pressure triggers a pounding headache  associated with blurred vision, then the patient should immediately seek attention at an urgent or emergency care unit, as these may be signs of an impending stroke.    Emergency Department Pain Levels (6-10/10)  Emergency Room Pain 6 Severely limiting. Requires emergency care and should not be seen or managed at an outpatient pain management facility. Communication becomes difficult and requires great effort. Assistance to reach the emergency department may be required. Facial flushing and profuse sweating along with potentially dangerous increases in heart rate and blood pressure will be evident.   Distressing pain 7 Self-care is very difficult. Assistance is required to transport, or use restroom. Assistance to reach the emergency department will be required. Tasks requiring coordination, such as bathing and getting dressed become very difficult.   Disabling pain 8 Self-care is no longer possible. At this level, pain is disabling. The individual is unable to do even the most "basic" activities such as walking, eating, bathing, dressing, transferring to a bed, or toileting. Fine motor skills are lost. It is difficult to think clearly.   Incapacitating pain 9 Pain becomes incapacitating. Thought processing is no longer possible. Difficult to remember your own name. Control of movement and coordination are lost.   The worst pain imaginable 10 At this level, most patients pass out from pain. When this level is reached, collapse of the autonomic nervous system occurs, leading to a sudden drop in blood pressure and heart rate. This in turn results in a temporary and dramatic drop in blood flow to the brain, leading to a loss of consciousness. Fainting is one of the body's self defense mechanisms. Passing out puts the brain in a calmed state and causes it to shut down for a while, in order to begin the healing process.    Summary: 1. Refer to this scale when providing [place] with your pain level. 2. Be  accurate and careful when reporting your pain level. This will help with your care. 3. Over-reporting your pain level will lead to loss of credibility. 4. Even a level of 1/10 means that there is pain and will be treated at our facility. 5. High, inaccurate reporting will be documented as "Symptom Exaggeration", leading to loss of credibility and suspicions of possible secondary gains such as obtaining more narcotics, or wanting to appear disabled, for fraudulent reasons. 6. Only pain levels of 5 or below will be seen at our facility. 7. Pain levels of 6 and above will be sent to the Emergency Department and the appointment cancelled. ____________________________________________________________________________________________   ____________________________________________________________________________________________  Post-Procedure Discharge Instructions  Instructions:  Apply ice: Fill a plastic sandwich bag with crushed ice. Cover it with a small towel and apply to injection site. Apply for 15 minutes then remove x 15 minutes. Repeat sequence on day of procedure, until you go to bed. The purpose is to minimize swelling and discomfort after procedure.  Apply heat: Apply heat to procedure site starting the day following the procedure. The purpose is to treat any soreness and discomfort from the procedure.  Food intake: Start with clear liquids (like water) and advance to regular food, as tolerated.   Physical activities: Keep activities to a minimum for the first 8 hours after the procedure.   Driving: If you have received any sedation, you are not allowed to drive for 24 hours after your procedure.  Blood thinner: Restart your blood thinner 6 hours after  your procedure. (Only for those taking blood thinners)  Insulin: As soon as you can eat, you may resume your normal dosing schedule. (Only for those taking insulin)  Infection prevention: Keep procedure site clean and  dry.  Post-procedure Pain Diary: Extremely important that this be done correctly and accurately. Recorded information will be used to determine the next step in treatment.  Pain evaluated is that of treated area only. Do not include pain from an untreated area.  Complete every hour, on the hour, for the initial 8 hours. Set an alarm to help you do this part accurately.  Do not go to sleep and have it completed later. It will not be accurate.  Follow-up appointment: Keep your follow-up appointment after the procedure. Usually 2 weeks for most procedures. (6 weeks in the case of radiofrequency.) Bring you pain diary.   Expect:  From numbing medicine (AKA: Local Anesthetics): Numbness or decrease in pain.  Onset: Full effect within 15 minutes of injected.  Duration: It will depend on the type of local anesthetic used. On the average, 1 to 8 hours.   From steroids: Decrease in swelling or inflammation. Once inflammation is improved, relief of the pain will follow.  Onset of benefits: Depends on the amount of swelling present. The more swelling, the longer it will take for the benefits to be seen. In some cases, up to 10 days.  Duration: Steroids will stay in the system x 2 weeks. Duration of benefits will depend on multiple posibilities including persistent irritating factors.  From procedure: Some discomfort is to be expected once the numbing medicine wears off. This should be minimal if ice and heat are applied as instructed.  Call if:  You experience numbness and weakness that gets worse with time, as opposed to wearing off.  New onset bowel or bladder incontinence. (This applies to Spinal procedures only)  Emergency Numbers:  Durning business hours (Monday - Thursday, 8:00 AM - 4:00 PM) (Friday, 9:00 AM - 12:00 Noon): [phone]  After hours: [phone] ____________________________________________________________________________________________

## 2017-09-19 NOTE — Progress Notes (Signed)
Patient's Name: Angel Simmons  MRN: [id number]  Referring Provider: Cletis Athens, MD  DOB: Jun 25, 1928  PCP: Cletis Athens, MD  DOS: 09/19/2017  Note by: Gaspar Cola, MD  Service setting: Ambulatory outpatient  Specialty: Interventional Pain Management  Location: [facility] (AMB) Pain Management Facility    Patient type: New patient ("FAST-TRACK" Evaluation)   Warning: This referral option does not include the extensive pharmacological evaluation required for [place] to take over the patient's medication management. The "Fast-Track" system is designed to bypass the new patient referral waiting list, as well as the normal patient evaluation process, in order to provide a patient in distress with a timely pain management intervention. Because the system was not designed to unfairly get a patient into our pain practice ahead of those already waiting, certain restrictions apply. By requesting a "Fast-Track" consult, the referring physician has opted to continue managing the patient's medications in order to get interventional urgent care.  Primary Reason for Visit: Interventional Pain Management Treatment. CC: Hip Pain (right)  Procedure #1:  Anesthesia, Analgesia, Anxiolysis:  Type: Diagnostic/therapeutic Epidural Steroid Injection #1 Region: Caudal Level: Sacrococcygeal   Laterality: Midline aiming at the right  Type: Local Anesthesia Indication(s): Analgesia         Route: Infiltration ([place]/IM) IV Access: Declined Sedation: Declined  Local Anesthetic: Lidocaine 1-2%   Indications: 1. Acute right lumbar radiculopathy (L3/L4)  2. Post herpetic neuralgia   3. Thigh shingles (Right)   4. 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 Thigh shingles   6. Multidermatomal herpes zoster infection    Procedure #2:       Type: Therapeutic Inter-Laminar Epidural Steroid Injection #1 Region: Lumbar Level: L4-5 Level. Laterality: Right paramedial   Indications: 1. Acute  right lumbar radiculopathy (Sacral)  2. Post herpetic neuralgia   3. Thigh shingles (Right)   4. 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 Thigh shingles   6. Multidermatomal herpes zoster infection    Pain Score: Pre-procedure: 8 /10 Post-procedure: 0-No pain/10  HPI  Angel Simmons is a 84 y.o. year old, female patient, who comes today for a  "Fast-Track" new patient evaluation, as requested by Cletis Athens, MD. The patient has been made aware that this type of referral option is reserved for the Interventional Pain Management portion of our practice and completely excludes the option of medication management. Her primarily concern today is the Hip Pain (right)  Pain Assessment: Location: Right Hip Radiating: radiates dwon right leg Onset: More than a month ago Duration: Chronic pain Quality: Discomfort Severity: 8 /10 (subjective, self-reported pain score)  Note: Reported level is inconsistent with clinical observations. Clinically the patient looks like a 5/10 A 5/10 is viewed as "Severe" and described as intense and extremely unpleasant. Associated with frowning face and frequent crying. Pain overwhelms the senses.  Ability to do any activity or maintain social relationships becomes significantly limited. Conversation becomes difficult. Pacing back and forth is common, as getting into a comfortable position is nearly impossible. Pain wakes you up from deep sleep. Physical signs will be obvious: pupillary dilation; increased sweating; goosebumps; brisk reflexes; cold, clammy hands and feet; nausea, vomiting or dry heaves; loss of appetite; significant sleep disturbance with inability to fall asleep or to remain asleep. When persistent, significant weight loss is observed due to the complete loss of appetite and sleep deprivation.  Blood pressure and heart rate becomes significantly elevated. Information on the proper use of the pain scale  provided to the patient  today. When using our objective Pain Scale, levels between 6 and 10/10 are said to belong in an emergency room, as it progressively worsens from a 6/10, described as severely limiting, requiring emergency care not usually available at an outpatient pain management facility. At a 6/10 level, communication becomes difficult and requires great effort. Assistance to reach the emergency department may be required. Facial flushing and profuse sweating along with potentially dangerous increases in heart rate and blood pressure will be evident. Timing: Intermittent Modifying factors: denies BP: (!) 180/93  HR: 85  Onset and Duration: Gradual Cause of pain: did not address this question Severity: Getting worse, NAS-11 at its worse: 103/10 and NAS-11 at its best: 6/10 Timing: Not influenced by the time of the day Aggravating Factors: did not address this question Alleviating Factors: nothing Associated Problems: Constipation, Depression, Sadness, Pain that wakes patient up and Pain that does not allow patient to sleep Quality of Pain: Agonizing, Exhausting, Horrible and Sharp Previous Examinations or Tests: X-rays Previous Treatments: Narcotic medications, Physical Therapy and Trigger point injections  The patient comes into the clinics today, referred to [place] for a lumbar epidural steroid injection to treat a recent case of radiculopathy secondary to a shingles infection. The patient comes into the clinics today crying with significant pain over the L4/L3 dermatome on the right side where there is still evidence of the shingle lesions following a dermatomal distribution. The lesions are healing and there is no discharge the patient is experiencing allodynia and electrical-like sensations. She recently had her gabapentin increased. She is a high risk for falls. Since she was recently discharged from the hospital she fell and hit her head/face. She has bruising over her entire face. It is difficult for her to  concentrate in answering questions due to her intermittent episodes of pain. These appear to be cyclical, every few minutes. She looks truly miserable. In addition, the daughter, who is accompanying her, describes that her shingles also got into her vaginal area. This would suggest that it is multi dermatomal affecting also some of the sacral nerve roots. This of this, today we have made decision to treat this by doing a lumbar epidural steroid injection on the right side at the L4-5 level as well as a caudal epidural steroid injection. The first one is to treat the pain around the L3, L4 distribution/Dermatome, while the second one is to treat the pain in the sacral region.  Because the patient was sent to [place] as a "Fast-Trak", we will not be taking over her medication management. The patient's daughter indicates that she is a patient of Dr. Sharlet Salina and that he has done injections for her in the past. They indicated that she will soon be running out of her opioid pain medication and they wanted to know if they should call me for a refill. I explained to them that because this was a "Fast-Trak" we would not be managing her medications and that she needed to contact either her primary care physician or Dr. Sharlet Salina. Review of her prior lumbar imaging, especially an MRI of the lumbar spine done in 2015, shows significant pathology with anterolisthesis, retrolisthesis, central spinal stenosis, foraminal stenosis, facet hypertrophy, and several disc herniations with extrusion, all of which may be contributing to a lot of her problems in the lower back in lower extremities.  Meds   Current Outpatient Medications:  .  Calcium Carb-Cholecalciferol (CALCIUM 1000 + D) 1000-800 MG-UNIT TABS, Take 1 tablet by  mouth daily., Disp: , Rfl:  .  Cholecalciferol (VITAMIN D3) 2000 units capsule, Take 4,000 Units by mouth daily., Disp: , Rfl:  .  Docusate Sodium (DSS) 100 MG CAPS, Take 1 capsule by mouth daily., Disp: , Rfl:  .   Ferrous Fumarate (HEMOCYTE - 106 MG FE) 324 (106 Fe) MG TABS tablet, Take 1 tablet by mouth 2 (two) times daily., Disp: , Rfl:  .  gabapentin (NEURONTIN) 100 MG capsule, Take 2 capsules (200 mg total) by mouth 3 (three) times daily., Disp: 60 capsule, Rfl: 0 .  lidocaine (LIDODERM) 5 %, Place 1 patch onto the skin daily. Remove & Discard patch within 12 hours or as directed by MD, Disp: 30 patch, Rfl: 0 .  lisinopril (PRINIVIL,ZESTRIL) 20 MG tablet, Take 20 mg by mouth daily., Disp: , Rfl: 4 .  memantine (NAMENDA) 5 MG tablet, Take 2.5-5 mg by mouth 2 (two) times daily. 1/2 tablet in the morning and 1 tablet in the evening, Disp: , Rfl:  .  Multiple Vitamin (MULTIVITAMIN) capsule, Take 1 capsule by mouth daily., Disp: , Rfl:  .  Multiple Vitamins-Minerals (PRESERVISION AREDS 2+MULTI VIT PO), Take 1 tablet by mouth daily., Disp: , Rfl:  .  oxyCODONE (OXY IR/ROXICODONE) 5 MG immediate release tablet, Take 1 tablet (5 mg total) by mouth every 6 (six) hours as needed for breakthrough pain., Disp: 30 tablet, Rfl: 0 .  pantoprazole (PROTONIX) 40 MG tablet, Take 40 mg by mouth daily., Disp: , Rfl:  .  traZODone (DESYREL) 50 MG tablet, Take 0.5 tablets (25 mg total) by mouth at bedtime as needed for sleep., Disp: 10 tablet, Rfl: 0  Imaging Review  Shoulder Imaging: Shoulder-R MR wo contrast:  Results for orders placed during the hospital encounter of 11/20/16  MR SHOULDER RIGHT WO CONTRAST   Narrative CLINICAL DATA:  History of fall 09/07/2016 with a right humerus fracture. Continued pain and limited range of motion.  EXAM: MRI OF THE RIGHT SHOULDER WITHOUT CONTRAST  TECHNIQUE: Multiplanar, multisequence MR imaging of the shoulder was performed. No intravenous contrast was administered.  COMPARISON:  Plain films right humerus 09/07/2016.  FINDINGS: Rotator cuff: There is rotator cuff tendinopathy with a full-thickness tear of the far lateral supraspinatus which is near complete measuring 1.3  cm from front to back. There is mild retraction of 1.5-2.5 cm. The rotator cuff is otherwise intact.  Muscles:  No focal atrophy or lesion.  Biceps long head: The tendon is completely torn from the superior labrum.  Acromioclavicular Joint: Intact. Moderate degenerative change is seen. Type 2 acromion. There is fluid in the subacromial/subdeltoid bursa.  Glenohumeral Joint: Unremarkable.  Labrum:  The superior labrum is degenerated.  No tear.  Bones: The patient has a transverse fracture of the surgical neck of the humerus with impaction of approximately 1.9 cm. The fracture extends into the posterior aspect of the greater tuberosity where a fragment measuring 1 cm AP by 1.8 cm craniocaudal by 0.8 cm transverse is seen. There is marrow edema about the patient's fracture. No definite bridging bone is identified.  Other: None.  IMPRESSION: Impacted surgical neck fracture of the humerus involves the greater tuberosity. CT or plain film is better for evaluation for healing but no bridging bone is identified on this examination. Position and alignment do not appear changed compared to the prior plain films.  Rotator cuff tendinopathy with a near complete supraspinatus tendon tear measuring 1.3 cm from front to back with retraction of 1.5-2.5 cm and no atrophy.  Complete tear of the long head of biceps from the superior labrum.  Moderate acromioclavicular osteoarthritis.   Electronically Signed   By: Inge Rise M.D.   On: 11/20/2016 16:12    Lumbosacral Imaging: Lumbar DG (Complete) 4+V:  Results for orders placed during the hospital encounter of 09/10/17  DG Lumbar Spine Complete   Narrative CLINICAL DATA:  84 year old female with pain right lower back and posterior right hip for 3 weeks. No recent injury. Initial encounter.  EXAM: LUMBAR SPINE - COMPLETE 4+ VIEW  COMPARISON:  12/21/2013 MR.  FINDINGS: Six non rib-bearing lumbar type vertebra. Level  assignment on prior MR with last fully open disc space at the L5-S1 level.  Scoliosis lumbar spine convex left with superimposed multilevel degenerative changes.  Prominent L1-2 and L2-3 disc space narrowing greater on the right.  L4-5 facet degenerative changes with mild anterior slip L4. Moderate L4-5 disc space narrowing.  Moderate T12-L1 disc space narrowing.  No pars defect detected.  No acute compression fracture.  Prior pinning right hip and left hip replacement.  Vascular calcifications. Aorta slightly ectatic. Mitral valve calcifications.  IMPRESSION: Scoliosis lumbar spine convex left with superimposed multilevel degenerative changes.  Prominent L1-2 and L2-3 disc space narrowing greater on the right.  L4-5 facet degenerative changes with mild anterior slip L4. Moderate L4-5 disc space narrowing.  Moderate T12-L1 disc space narrowing.  Aortic Atherosclerosis (ICD10-I70.0).   Electronically Signed   By: Genia Del M.D.   On: 09/10/2017 18:30    Hip Imaging: Hip-R DG 2-3 views:  Results for orders placed during the hospital encounter of 09/10/17  DG HIP UNILAT WITH PELVIS 2-3 VIEWS RIGHT   Narrative CLINICAL DATA:  84 year old female with pain right lower back and posterior right hip for 3 weeks. No recent injury. Initial encounter.  EXAM: DG HIP (WITH OR WITHOUT PELVIS) 2-3V RIGHT  COMPARISON:  Lumbar spine plain film exam same date dictated separately.  FINDINGS: Prior pinning right hip. No loosening of pins. No plain film evidence of right femoral head avascular necrosis. Minimal right hip joint degenerative changes. No fracture or dislocation.  Post total left hip replacement.  No pelvic fracture noted.  Prominent degenerative changes lumbar spine. Please see lumbar spine report.  Moderate stool.  Vascular calcifications.  IMPRESSION: Post right hip pinning and left hip replacement without complication noted. Minimal right hip  degenerative changes.  Prominent degenerative changes lumbar spine. Please see lumbar spine report.   Electronically Signed   By: Genia Del M.D.   On: 09/10/2017 18:33    Hip-L DG 2-3 views:  Results for orders placed during the hospital encounter of 04/04/15  DG HIP UNILAT WITH PELVIS 2-3 VIEWS LEFT   Narrative CLINICAL DATA:  Fall in kitchen today.  Left hip pain.  EXAM: DG HIP (WITH OR WITHOUT PELVIS) 2-3V LEFT  COMPARISON:  None.  FINDINGS: There is a left femoral neck fracture with varus angulation. No subluxation or dislocation. 3 screws noted within the right proximal femur.  IMPRESSION: Left femoral neck fracture with varus angulation.   Electronically Signed   By: Rolm Baptise M.D.   On: 04/04/2015 16:48    Complexity Note: Imaging results reviewed. Results shared with Ms. Heide, using Layman's terms.                         ROS  Cardiovascular: No reported cardiovascular signs or symptoms such as High blood pressure, coronary artery disease, abnormal heart rate  or rhythm, heart attack, blood thinner therapy or heart weakness and/or failure Pulmonary or Respiratory: No reported pulmonary signs or symptoms such as wheezing and difficulty taking a deep full breath (Asthma), difficulty blowing air out (Emphysema), coughing up mucus (Bronchitis), persistent dry cough, or temporary stoppage of breathing during sleep Neurological: No reported neurological signs or symptoms such as seizures, abnormal skin sensations, urinary and/or fecal incontinence, being born with an abnormal open spine and/or a tethered spinal cord Review of Past Neurological Studies:  Results for orders placed or performed during the hospital encounter of 09/12/17  CT Head Wo Contrast   Narrative   CLINICAL DATA:  Patient was diagnosed with shingles on April 5th. Now with increased pain and altered mental status.  EXAM: CT HEAD WITHOUT CONTRAST  TECHNIQUE: Contiguous axial images were  obtained from the base of the skull through the vertex without intravenous contrast.  COMPARISON:  None.  FINDINGS: Brain: Diffuse cerebral atrophy. Ventricular dilatation consistent with central atrophy. Low-attenuation changes in the deep white matter consistent with small vessel ischemia. Basal ganglia calcifications. No mass effect or midline shift. No abnormal extra-axial fluid collections. Gray-white matter junctions are distinct. Basal cisterns are not effaced. No acute intracranial hemorrhage.  Vascular: Intracranial arterial vascular calcifications are present.  Skull: Calvarium appears intact.  Sinuses/Orbits: Paranasal sinuses and mastoid air cells are clear.  Other: None.  IMPRESSION: No acute intracranial abnormalities. Chronic atrophy and small vessel ischemic changes.   Electronically Signed   By: Lucienne Capers M.D.   On: 09/12/2017 01:37    Psychological-Psychiatric: No reported psychological or psychiatric signs or symptoms such as difficulty sleeping, anxiety, depression, delusions or hallucinations (schizophrenial), mood swings (bipolar disorders) or suicidal ideations or attempts Gastrointestinal: Heartburn due to stomach pushing into lungs (Hiatal hernia), Reflux or heatburn, Inflamed pancreas (Pancreatitis) and Irregular, infrequent bowel movements (Constipation) Genitourinary: No reported renal or genitourinary signs or symptoms such as difficulty voiding or producing urine, peeing blood, non-functioning kidney, kidney stones, difficulty emptying the bladder, difficulty controlling the flow of urine, or chronic kidney disease Hematological: Brusing easily Endocrine: No reported endocrine signs or symptoms such as high or low blood sugar, rapid heart rate due to high thyroid levels, obesity or weight gain due to slow thyroid or thyroid disease Rheumatologic: No reported rheumatological signs and symptoms such as fatigue, joint pain, tenderness, swelling,  redness, heat, stiffness, decreased range of motion, with or without associated rash Musculoskeletal: Negative for myasthenia gravis, muscular dystrophy, multiple sclerosis or malignant hyperthermia Work History: Retired  Allergies  Angel Simmons is allergic to tylenol [acetaminophen].  Laboratory Chemistry  Inflammation Markers (CRP: Acute Phase) (ESR: Chronic Phase) Lab Results  Component Value Date   LATICACIDVEN 0.9 09/12/2017                         Renal Function Markers Lab Results  Component Value Date   BUN 19 09/12/2017   CREATININE 1.07 (H) 09/12/2017   GFRAA 53 (L) 09/12/2017   GFRNONAA 45 (L) 09/12/2017                              Hepatic Function Markers Lab Results  Component Value Date   AST 38 09/12/2017   ALT 24 09/12/2017   ALBUMIN 4.0 09/12/2017   ALKPHOS 60 09/12/2017  Electrolytes Lab Results  Component Value Date   NA 134 (L) 09/12/2017   K 3.1 (L) 09/12/2017   CL 97 (L) 09/12/2017   CALCIUM 9.0 09/12/2017                        Bone Pathology Markers Lab Results  Component Value Date   VD25OH 17.4 (L) 04/04/2015                         Coagulation Parameters Lab Results  Component Value Date   INR 0.94 04/04/2015   LABPROT 12.8 04/04/2015   APTT 30 04/04/2015   PLT 228 09/12/2017                        Cardiovascular Markers Lab Results  Component Value Date   TROPONINI <0.03 09/12/2017   HGB 12.2 09/12/2017   HCT 35.2 09/12/2017                        Note: Lab results reviewed.  [place]  Drug: Angel Simmons  reports that she does not use drugs. Alcohol:  reports that she does not drink alcohol. Tobacco:  reports that she has never smoked. She has never used smokeless tobacco. Medical:  has a past medical history of Chronic back pain, Hip fx ([place]), Hypertension, Rheumatoid arthritis ([place]), and Spinal stenosis. Family: family history includes Breast cancer in her mother; Dementia in her father.  Past  Surgical History:  Procedure Laterality Date  . CATARACT EXTRACTION, BILATERAL    . CHOLECYSTECTOMY    . HEMORRHOID SURGERY    . HIP ARTHROPLASTY Left 04/05/2015   Procedure: ARTHROPLASTY BIPOLAR HIP (HEMIARTHROPLASTY);  Surgeon: Dereck Leep, MD;  Location: [facility] ORS;  Service: Orthopedics;  Laterality: Left;  . Percutaneous pinning of a right femoral neck fracture     Active Ambulatory Problems    Diagnosis Date Noted  . Hip fracture ([place]) 04/04/2015  . Chronic LBP 09/25/2013  . Degeneration of intervertebral disc of lumbar region 09/25/2013  . Neuritis or radiculitis due to rupture of lumbar intervertebral disc 09/25/2013  . Degenerative arthritis of lumbar spine 09/25/2013  . Post herpetic neuralgia 09/12/2017  . Acute encephalopathy 09/12/2017  . UTI (urinary tract infection) 09/12/2017  . HTN (hypertension) 09/12/2017  . At high risk for falls 06/08/2016  . C. difficile diarrhea 11/13/2015  . Chronic pain of multiple joints 02/06/2017  . Gastroesophageal reflux disease without esophagitis 11/12/2015  . History of fall 06/08/2016  . Lumbar radiculitis 09/25/2013  . Major neurocognitive disorder due to multiple etiologies 06/08/2016  . Melanotic stools 11/12/2015  . S/P hip hemiarthroplasty 07/02/2015  . Ulcer of esophagus with bleeding 11/14/2015  . Chronic low back pain (Secondary Area of Pain) (Bilateral) with sciatica (Right) 09/25/2013  . HNP (herniated nucleus pulposus), lumbar 04/26/2015  . DDD (degenerative disc disease), lumbar 09/25/2013  . Lumbar spondylosis 09/25/2013  . Fracture of left hip requiring operative repair ([place]) 05/17/2015  . Essential hypertension 11/12/2015  . Acute right lumbar radiculopathy 09/19/2017  . Thigh shingles (Right) 09/19/2017  . 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 Spondylosis without myelopathy or radiculopathy, lumbosacral region 09/19/2017  . Lumbar facet syndrome (Bilateral)  09/19/2017  . Abnormal MRI, lumbar spine (12/21/2013) 09/19/2017   Resolved Ambulatory Problems    Diagnosis Date Noted  . No Resolved Ambulatory Problems   Past  Medical History:  Diagnosis Date  . Chronic back pain   . Hip fx ([place])   . Hypertension   . Rheumatoid arthritis ([place])   . Spinal stenosis    Constitutional Exam  General appearance: Well nourished, well developed, and well hydrated. In no apparent acute distress Vitals:   09/19/17 1430 09/19/17 1435 09/19/17 1440 09/19/17 1445  BP: (!) 190/80 (!) 187/93 (!) 185/93 (!) 180/93  Pulse:      Resp: (!) 25 15 20 20   Temp:      SpO2: 98% 97% 97% 98%  Weight:      Height:       BMI Assessment: Estimated body mass index is 18.8 kg/m as calculated from the following:   Height as of this encounter: 5' 5"  (1.651 m).   Weight as of this encounter: 113 lb (51.3 kg).  BMI interpretation table: BMI level Category Range association with higher incidence of chronic pain  <18 kg/m2 Underweight   18.5-24.9 kg/m2 Ideal body weight   25-29.9 kg/m2 Overweight Increased incidence by 20%  30-34.9 kg/m2 Obese (Class I) Increased incidence by 68%  35-39.9 kg/m2 Severe obesity (Class II) Increased incidence by 136%  >40 kg/m2 Extreme obesity (Class III) Increased incidence by 254%   Patient's current BMI Ideal Body weight  Body mass index is 18.8 kg/m. Ideal body weight: 57 kg (125 lb 10.6 oz)   BMI Readings from Last 4 Encounters:  09/19/17 18.80 kg/m  09/12/17 19.98 kg/m  09/07/16 20.11 kg/m  04/04/15 22.76 kg/m   Wt Readings from Last 4 Encounters:  09/19/17 113 lb (51.3 kg)  09/12/17 116 lb 6.4 oz (52.8 kg)  09/07/16 119 lb (54 kg)  04/04/15 132 lb 9.6 oz (60.1 kg)  Psych/Mental status: Alert, oriented x 3 (person, place, & time)       Eyes: PERLA Respiratory: No evidence of acute respiratory distress  Lumbar Spine Area Exam  Skin & Axial Inspection: No masses, redness, or swelling Alignment: Scoliosis  detected Functional ROM: Minimal ROM       Stability: No instability detected Muscle Tone/Strength: Significant age related deconditioning Sensory (Neurological): Movement-associated pain Palpation: Complains of area being tender to palpation       Provocative Tests: Lumbar Hyperextension and rotation test: Positive bilaterally for facet joint pain. Lumbar Lateral bending test: evaluation deferred today       Patrick's Maneuver: evaluation deferred today                    Gait & Posture Assessment  Ambulation: Patient ambulates using a walker Gait: Significantly limited. Dependent on assistive device to ambulate Posture: Difficulty standing up straight, due to pain   Lower Extremity Exam    Side: Right lower extremity  Side: Left lower extremity  Stability: No instability observed          Stability: No instability observed          Skin & Extremity Inspection: Clear evidence of healing shingle lesions over the dermatomal distribution of the L3/L4 nerves on the right side.  Skin & Extremity Inspection: Skin color, temperature, and hair growth are WNL. No peripheral edema or cyanosis. No masses, redness, swelling, asymmetry, or associated skin lesions. No contractures.  Functional ROM: Decreased ROM for hip and knee joints Limited SLR (straight leg raise)  Functional ROM: Unrestricted ROM                  Muscle Tone/Strength: Guarding  Muscle Tone/Strength: Functionally intact. No  obvious neuro-muscular anomalies detected.  Sensory (Neurological): Dermatomal pain pattern  Sensory (Neurological): Unimpaired  Palpation: Complains of area being tender to palpation  Palpation: No palpable anomalies   Assessment  Primary Diagnosis & Pertinent Problem List: The primary encounter diagnosis was Acute right lumbar radiculopathy. 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  Visit Diagnosis (New problems to examiner): 1. Acute right lumbar radiculopathy   2. Post herpetic neuralgia   3. Thigh shingles (Right)   4. 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 Thigh shingles   6. Multidermatomal herpes zoster infection    Pre-op Assessment:  Angel Simmons is a 84 y.o. (year old), female patient, seen today for interventional treatment. She  has a past surgical history that includes Percutaneous pinning of a right femoral neck fracture; Cholecystectomy; Hemorrhoid surgery; Cataract extraction, bilateral; and Hip Arthroplasty (Left, 04/05/2015). Angel Simmons has a current medication list which includes the following prescription(s): calcium carb-cholecalciferol, vitamin d3, dss, ferrous fumarate, gabapentin, lidocaine, lisinopril, memantine, multivitamin, multiple vitamins-minerals, oxycodone, pantoprazole, and trazodone. Her primarily concern today is the Hip Pain (right)  Initial Vital Signs:  Pulse/HCG Rate: 85ECG Heart Rate: 89 Temp: 97.9 F (36.6 C) Resp: 16 BP: (!) 160/86 SpO2: 100 %  BMI: Estimated body mass index is 18.8 kg/m as calculated from the following:   Height as of this encounter: 5' 5"  (1.651 m).   Weight as of this encounter: 113 lb (51.3 kg).  Risk Assessment: Allergies: Reviewed. She is allergic to tylenol [acetaminophen].  Allergy Precautions: None required Coagulopathies: Reviewed. None identified.  Blood-thinner therapy: None at this time Active Infection(s): Reviewed. None identified. Ms. Pavlich is afebrile  Site Confirmation: Angel Simmons was asked to confirm the procedure and laterality before marking the site Procedure checklist: Completed Consent: Before the procedure and under the influence of no sedative(s), amnesic(s), or anxiolytics, the patient was informed of the treatment options, risks and possible complications. To fulfill our ethical and  legal obligations, as recommended by the American Medical Association's Code of Ethics, I have informed the patient of my clinical impression; the nature and purpose of the treatment or procedure; the risks, benefits, and possible complications of the intervention; the alternatives, including doing nothing; the risk(s) and benefit(s) of the alternative treatment(s) or procedure(s); and the risk(s) and benefit(s) of doing nothing. The patient was provided information about the general risks and possible complications associated with the procedure. These may include, but are not limited to: failure to achieve desired goals, infection, bleeding, organ or nerve damage, allergic reactions, paralysis, and death. In addition, the patient was informed of those risks and complications associated to Spine-related procedures, such as failure to decrease pain; infection (i.e.: Meningitis, epidural or intraspinal abscess); bleeding (i.e.: epidural hematoma, subarachnoid hemorrhage, or any other type of intraspinal or peri-dural bleeding); organ or nerve damage (i.e.: Any type of peripheral nerve, nerve root, or spinal cord injury) with subsequent damage to sensory, motor, and/or autonomic systems, resulting in permanent pain, numbness, and/or weakness of one or several areas of the body; allergic reactions; (i.e.: anaphylactic reaction); and/or death. Furthermore, the patient was informed of those risks and complications associated with the medications. These include, but are not limited to: allergic reactions (i.e.: anaphylactic or anaphylactoid reaction(s)); adrenal axis suppression; blood sugar elevation that in diabetics may result in ketoacidosis or comma; water retention that in patients  with history of congestive heart failure may result in shortness of breath, pulmonary edema, and decompensation with resultant heart failure; weight gain; swelling or edema; medication-induced neural toxicity; particulate matter  embolism and blood vessel occlusion with resultant organ, and/or nervous system infarction; and/or aseptic necrosis of one or more joints. Finally, the patient was informed that Medicine is not an exact science; therefore, there is also the possibility of unforeseen or unpredictable risks and/or possible complications that may result in a catastrophic outcome. The patient indicated having understood very clearly. We have given the patient no guarantees and we have made no promises. Enough time was given to the patient to ask questions, all of which were answered to the patient's satisfaction. Angel Simmons has indicated that she wanted to continue with the procedure. Attestation: I, the ordering provider, attest that I have discussed with the patient the benefits, risks, side-effects, alternatives, likelihood of achieving goals, and potential problems during recovery for the procedure that I have provided informed consent. Date  Time: 09/19/2017  1:17 PM  Pre-Procedure Preparation:  Monitoring: As per clinic protocol. Respiration, ETCO2, SpO2, BP, heart rate and rhythm monitor placed and checked for adequate function Safety Precautions: Patient was assessed for positional comfort and pressure points before starting the procedure. Time-out: I initiated and conducted the "Time-out" before starting the procedure, as per protocol. The patient was asked to participate by confirming the accuracy of the "Time Out" information. Verification of the correct person, site, and procedure were performed and confirmed by me, the nursing staff, and the patient. "Time-out" conducted as per Joint Commission's Universal Protocol (UP.01.01.01). Time: 1430  Description of Procedure #1:   Position: Prone Target Area: Caudal Epidural Canal. Approach: Midline approach. Area Prepped: Entire Posterior Sacrococcygeal Region Prepping solution: ChloraPrep (2% chlorhexidine gluconate and 70% isopropyl alcohol) Safety Precautions:  Aspiration looking for blood return was conducted prior to all injections. At no point did we inject any substances, as a needle was being advanced. No attempts were made at seeking any paresthesias. Safe injection practices and needle disposal techniques used. Medications properly checked for expiration dates. SDV (single dose vial) medications used. Description of the Procedure: Protocol guidelines were followed. The patient was placed in position over the fluoroscopy table. The target area was identified and the area prepped in the usual manner. Skin desensitized using vapocoolant spray. Skin & deeper tissues infiltrated with local anesthetic. Appropriate amount of time allowed to pass for local anesthetics to take effect. The procedure needles were then advanced to the target area. Proper needle placement secured. Negative aspiration confirmed. Solution injected in intermittent fashion, asking for systemic symptoms every 0.5cc of injectate. The needles were then removed and the area cleansed, making sure to leave some of the prepping solution back to take advantage of its long term bactericidal properties.  Start Time: 1430 hrs.  Materials:  Needle(s) Type: Epidural needle Gauge: 17G Length: 3.5-in Medication(s): Please see orders for medications and dosing details.  Description of Procedure #2:  Position: Prone with head of the table was raised to facilitate breathing. Target Area: The interlaminar space, initially targeting the lower laminar border of the superior vertebral body. Approach: Paramedial approach. Area Prepped: Entire Posterior Lumbar Region Prepping solution: ChloraPrep (2% chlorhexidine gluconate and 70% isopropyl alcohol) Safety Precautions: Aspiration looking for blood return was conducted prior to all injections. At no point did we inject any substances, as a needle was being advanced. No attempts were made at seeking any paresthesias. Safe injection practices and needle  disposal techniques used. Medications properly checked for expiration dates. SDV (single dose vial) medications used. Description of the Procedure: Protocol guidelines were followed. The procedure needle was introduced through the skin, ipsilateral to the reported pain, and advanced to the target area. Bone was contacted and the needle walked caudad, until the lamina was cleared. The epidural space was identified using "loss-of-resistance technique" with 2-3 ml of PF-NaCl (0.9% NSS), in a 5cc LOR glass syringe.  Vitals:   09/19/17 1430 09/19/17 1435 09/19/17 1440 09/19/17 1445  BP: (!) 190/80 (!) 187/93 (!) 185/93 (!) 180/93  Pulse:      Resp: (!) 25 15 20 20   Temp:      SpO2: 98% 97% 97% 98%  Weight:      Height:       End Time: 1445 hrs. Materials:  Needle(s) Type: Epidural needle Gauge: 17G Length: 3.5-in Medication(s): Please see orders for medications and dosing details.  Imaging Guidance (Spinal):  Type of Imaging Technique: Fluoroscopy Guidance (Spinal) Indication(s): Assistance in needle guidance and placement for procedures requiring needle placement in or near specific anatomical locations not easily accessible without such assistance. Exposure Time: Please see nurses notes. Contrast: Before injecting any contrast, we confirmed that the patient did not have an allergy to iodine, shellfish, or radiological contrast. Once satisfactory needle placement was completed at the desired level, radiological contrast was injected. Contrast injected under live fluoroscopy. No contrast complications. See chart for type and volume of contrast used. Fluoroscopic Guidance: I was personally present during the use of fluoroscopy. "Tunnel Vision Technique" used to obtain the best possible view of the target area. Parallax error corrected before commencing the procedure. "Direction-depth-direction" technique used to introduce the needle under continuous pulsed fluoroscopy. Once target was reached,  antero-posterior, oblique, and lateral fluoroscopic projection used confirm needle placement in all planes. Images permanently stored in EMR. Interpretation: I personally interpreted the imaging intraoperatively. Adequate needle placement confirmed in multiple planes. Appropriate spread of contrast into desired area was observed. No evidence of afferent or efferent intravascular uptake. No intrathecal or subarachnoid spread observed. Permanent images saved into the patient's record.  Antibiotic Prophylaxis:   Anti-infectives (From admission, onward)   None     Indication(s): None identified  Post-operative Assessment:  Post-procedure Vital Signs:  Pulse/HCG Rate: 8577 Temp: 97.9 F (36.6 C) Resp: 20 BP: (!) 180/93 SpO2: 98 %  EBL: None  Complications: No immediate post-treatment complications observed by team, or reported by patient.  Note: The patient tolerated the entire procedure well. A repeat set of vitals were taken after the procedure and the patient was kept under observation following institutional policy, for this type of procedure. Post-procedural neurological assessment was performed, showing return to baseline, prior to discharge. The patient was provided with post-procedure discharge instructions, including a section on how to identify potential problems. Should any problems arise concerning this procedure, the patient was given instructions to immediately contact us, at any time, without hesitation. In any case, we plan to contact the patient by telephone for a follow-up status report regarding this interventional procedure.  Comments:  No additional relevant information.  Plan of Care   Imaging Orders     DG C-Arm 1-60 Min-No Report  Procedure Orders     Caudal Epidural Injection     Lumbar Epidural Injection  Medications ordered for procedure: Meds ordered this encounter  Medications  . iopamidol (ISOVUE-M) 41 % intrathecal injection 10 mL    Must be  Myelogram-compatible. If not available, you  may substitute with a water-soluble, non-ionic, hypoallergenic, myelogram-compatible radiological contrast medium.  Marland Kitchen lidocaine (XYLOCAINE) 2 % (with pres) injection 400 mg  . sodium chloride flush (NS) 0.9 % injection 2 mL  . ropivacaine (PF) 2 mg/mL (0.2%) (NAROPIN) injection 2 mL  . triamcinolone acetonide (KENALOG-40) injection 40 mg  . sodium chloride flush (NS) 0.9 % injection 2 mL  . ropivacaine (PF) 2 mg/mL (0.2%) (NAROPIN) injection 2 mL  . triamcinolone acetonide (KENALOG-40) injection 40 mg   Medications administered: We administered iopamidol, lidocaine, sodium chloride flush, ropivacaine (PF) 2 mg/mL (0.2%), triamcinolone acetonide, sodium chloride flush, ropivacaine (PF) 2 mg/mL (0.2%), and triamcinolone acetonide.  See the medical record for exact dosing, route, and time of administration.  New Prescriptions   No medications on file   Disposition: Discharge home  Discharge Date & Time: 09/19/2017; 1453 hrs.   Physician-requested Follow-up: Return for post-procedure eval (2 wks), w/ Dr. Dossie Arbour.  Future Appointments  Date Time Provider [place]  10/14/2017  2:00 PM Milinda Pointer, MD [facility] None   Primary Care Physician: Cletis Athens, MD Location: [facility] Note by: Gaspar Cola, MD Date: 09/19/2017; Time: 4:18 PM  Disclaimer:  Medicine is not an Chief Strategy Officer. The only guarantee in medicine is that nothing is guaranteed. It is important to note that the decision to proceed with this intervention was based on the information collected from the patient. The Data and conclusions were drawn from the patient's questionnaire, the interview, and the physical examination. Because the information was provided in large part by the patient, it cannot be guaranteed that it has not been purposely or unconsciously manipulated. Every effort has been made to obtain as much relevant data as  possible for this evaluation. It is important to note that the conclusions that lead to this procedure are derived in large part from the available data. Always take into account that the treatment will also be dependent on availability of resources and existing treatment guidelines, considered by other Pain Management Practitioners as being common knowledge and practice, at the time of the intervention. For Medico-Legal purposes, it is also important to point out that variation in procedural techniques and pharmacological choices are the acceptable norm. The indications, contraindications, technique, and results of the above procedure should only be interpreted and judged by a Board-Certified Interventional Pain Specialist with extensive familiarity and expertise in the same exact procedure and technique.

## 2017-09-19 NOTE — Progress Notes (Signed)
Safety precautions to be maintained throughout the outpatient stay will include: orient to surroundings, keep bed in low position, maintain call bell within reach at all times, provide assistance with transfer out of bed and ambulation.  

## 2017-09-20 ENCOUNTER — Telehealth: Payer: Self-pay

## 2017-09-20 NOTE — Telephone Encounter (Signed)
Post procedure phone call.  No answer.  Spoke with daughter and she states pain went away for 1 hour then came back.

## 2017-10-13 NOTE — Progress Notes (Deleted)
Patient's Name: Angel Simmons  MRN: [id number]  Referring Provider: Cletis Athens, MD  DOB: 10-Feb-1929  PCP: Cletis Athens, MD  DOS: 10/14/2017  Note by: Gaspar Cola, MD  Service setting: Ambulatory outpatient  Specialty: Interventional Pain Management  Location: [facility] (AMB) Pain Management Facility    Patient type: Established ("FAST-TRACK") Evaluation   Primary Reason(s) for Visit: Encounter for post-procedure evaluation of chronic illness with mild to moderate exacerbation CC: No chief complaint on file.  HPI  Angel Simmons is a 84 y.o. year old, female patient, who comes today for a post-procedure evaluation. She has Hip fracture ([place]); Chronic LBP; Degeneration of intervertebral disc of lumbar region; Neuritis or radiculitis due to rupture of lumbar intervertebral disc; Degenerative arthritis of lumbar spine; Post herpetic neuralgia; Acute encephalopathy; UTI (urinary tract infection); HTN (hypertension); At high risk for falls; C. difficile diarrhea; Chronic pain of multiple joints; Gastroesophageal reflux disease without esophagitis; History of fall; Lumbar radiculitis; Major neurocognitive disorder due to multiple etiologies; Melanotic stools; S/P hip hemiarthroplasty; Ulcer of esophagus with bleeding; Chronic low back pain (Secondary Area of Pain) (Bilateral) with sciatica (Right); HNP (herniated nucleus pulposus), lumbar; DDD (degenerative disc disease), lumbar; Lumbar spondylosis; Fracture of left hip requiring operative repair [facility]); Essential hypertension; Acute right lumbar radiculopathy; Thigh shingles (Right); 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 Her primarily concern today is the No chief complaint on file.  Pain Assessment: Location:     Radiating:   Onset:   Duration:   Quality:    Severity:  /10 (subjective, self-reported pain score)  Note: Reported level is compatible with observation.                         When using our objective Pain Scale, levels between 6 and 10/10 are said to belong in an emergency room, as it progressively worsens from a 6/10, described as severely limiting, requiring emergency care not usually available at an outpatient pain management facility. At a 6/10 level, communication becomes difficult and requires great effort. Assistance to reach the emergency department may be required. Facial flushing and profuse sweating along with potentially dangerous increases in heart rate and blood pressure will be evident. Effect on ADL:   Timing:   Modifying factors:   BP:    HR:    Angel Simmons comes in today for post-procedure evaluation after the treatment done on 09/20/2017.  Further details on both, my assessment(s), as well as the proposed treatment plan, please see below.  Post-Procedure Assessment  09/19/2017 Procedure: Diagnostic/therapeutic right-sided caudal LESI #1 + right sided L4-5 interlaminar LESI #1 under fluoroscopic guidance, no sedation Pre-procedure pain score:  8/10 Post-procedure pain score: 0/10 (100% relief) Influential Factors: BMI:   Intra-procedural challenges: None observed.         Assessment challenges: None detected.              Reported side-effects: None.        Post-procedural adverse reactions or complications: None reported         Sedation: No sedation used. When no sedatives are used, the analgesic levels obtained are directly associated to the effectiveness of the local anesthetics. However, when sedation is provided, the level of analgesia obtained during the initial 1 hour following the intervention, is believed to be the result of a combination of factors. These  factors may include, but are not limited to: 1. The effectiveness of the local anesthetics used. 2. The effects of the analgesic(s) and/or anxiolytic(s)  used. 3. The degree of discomfort experienced by the patient at the time of the procedure. 4. The patients ability and reliability in recalling and recording the events. 5. The presence and influence of possible secondary gains and/or psychosocial factors. Reported result: Relief experienced during the 1st hour after the procedure:   (Ultra-Short Term Relief)            Interpretative annotation: Clinically appropriate result. No IV Analgesic or Anxiolytic given, therefore benefits are completely due to Local Anesthetic effects.          Effects of local anesthetic: The analgesic effects attained during this period are directly associated to the localized infiltration of local anesthetics and therefore cary significant diagnostic value as to the etiological location, or anatomical origin, of the pain. Expected duration of relief is directly dependent on the pharmacodynamics of the local anesthetic used. Long-acting (4-6 hours) anesthetics used.  Reported result: Relief during the next 4 to 6 hour after the procedure:   (Short-Term Relief)            Interpretative annotation: Clinically appropriate result. Analgesia during this period is likely to be Local Anesthetic-related.          Long-term benefit: Defined as the period of time past the expected duration of local anesthetics (1 hour for short-acting and 4-6 hours for long-acting). With the possible exception of prolonged sympathetic blockade from the local anesthetics, benefits during this period are typically attributed to, or associated with, other factors such as analgesic sensory neuropraxia, antiinflammatory effects, or beneficial biochemical changes provided by agents other than the local anesthetics.  Reported result: Extended relief following procedure:   (Long-Term Relief)            Interpretative annotation: Clinically appropriate result. Good relief. No permanent benefit expected. Inflammation plays a part in the etiology to the pain.           Current benefits: Defined as reported results that persistent at this point in time.   Analgesia: *** %            Function: Somewhat improved ROM: Somewhat improved Interpretative annotation: Recurrence of symptoms. No permanent benefit expected. Effective diagnostic intervention.          Interpretation: Results would suggest a successful diagnostic intervention.                  Plan:  Please see "Plan of Care" for details.                Laboratory Chemistry  Inflammation Markers (CRP: Acute Phase) (ESR: Chronic Phase) Lab Results  Component Value Date   LATICACIDVEN 0.9 09/12/2017                         Renal Markers Lab Results  Component Value Date   BUN 19 09/12/2017   CREATININE 1.07 (H) 09/12/2017   GFRAA 53 (L) 09/12/2017   GFRNONAA 45 (L) 09/12/2017                             Hepatic Markers Lab Results  Component Value Date   AST 38 09/12/2017   ALT 24 09/12/2017   ALBUMIN 4.0 09/12/2017  Neuropathy Markers No results found for: HGBA1C, HIV                      Hematology Parameters Lab Results  Component Value Date   INR 0.94 04/04/2015   LABPROT 12.8 04/04/2015   APTT 30 04/04/2015   PLT 228 09/12/2017   HGB 12.2 09/12/2017   HCT 35.2 09/12/2017                        CV Markers Lab Results  Component Value Date   TROPONINI <0.03 09/12/2017                         Note: Lab results reviewed.  Recent Diagnostic Imaging Results  DG C-Arm 1-60 Min-No Report Fluoroscopy was utilized by the requesting physician.  No radiographic  interpretation.   Complexity Note: I personally reviewed the fluoroscopic imaging of the procedure.                        Meds   Current Outpatient Medications:  .  Calcium Carb-Cholecalciferol (CALCIUM 1000 + D) 1000-800 MG-UNIT TABS, Take 1 tablet by mouth daily., Disp: , Rfl:  .  Cholecalciferol (VITAMIN D3) 2000 units capsule, Take 4,000 Units by mouth daily., Disp: , Rfl:  .   Docusate Sodium (DSS) 100 MG CAPS, Take 1 capsule by mouth daily., Disp: , Rfl:  .  Ferrous Fumarate (HEMOCYTE - 106 MG FE) 324 (106 Fe) MG TABS tablet, Take 1 tablet by mouth 2 (two) times daily., Disp: , Rfl:  .  gabapentin (NEURONTIN) 100 MG capsule, Take 2 capsules (200 mg total) by mouth 3 (three) times daily., Disp: 60 capsule, Rfl: 0 .  lidocaine (LIDODERM) 5 %, Place 1 patch onto the skin daily. Remove & Discard patch within 12 hours or as directed by MD, Disp: 30 patch, Rfl: 0 .  lisinopril (PRINIVIL,ZESTRIL) 20 MG tablet, Take 20 mg by mouth daily., Disp: , Rfl: 4 .  memantine (NAMENDA) 5 MG tablet, Take 2.5-5 mg by mouth 2 (two) times daily. 1/2 tablet in the morning and 1 tablet in the evening, Disp: , Rfl:  .  Multiple Vitamin (MULTIVITAMIN) capsule, Take 1 capsule by mouth daily., Disp: , Rfl:  .  Multiple Vitamins-Minerals (PRESERVISION AREDS 2+MULTI VIT PO), Take 1 tablet by mouth daily., Disp: , Rfl:  .  oxyCODONE (OXY IR/ROXICODONE) 5 MG immediate release tablet, Take 1 tablet (5 mg total) by mouth every 6 (six) hours as needed for breakthrough pain., Disp: 30 tablet, Rfl: 0 .  pantoprazole (PROTONIX) 40 MG tablet, Take 40 mg by mouth daily., Disp: , Rfl:  .  traZODone (DESYREL) 50 MG tablet, Take 0.5 tablets (25 mg total) by mouth at bedtime as needed for sleep., Disp: 10 tablet, Rfl: 0  ROS  Constitutional: Denies any fever or chills Gastrointestinal: No reported hemesis, hematochezia, vomiting, or acute GI distress Musculoskeletal: Denies any acute onset joint swelling, redness, loss of ROM, or weakness Neurological: No reported episodes of acute onset apraxia, aphasia, dysarthria, agnosia, amnesia, paralysis, loss of coordination, or loss of consciousness  Allergies  Angel Simmons is allergic to tylenol [acetaminophen].  [place]  Drug: Angel Simmons  reports that she does not use drugs. Alcohol:  reports that she does not drink alcohol. Tobacco:  reports that she has never  smoked. She has never used smokeless tobacco. Medical:  has a past  medical history of Chronic back pain, Hip fx ([place]), Hypertension, Rheumatoid arthritis ([place]), and Spinal stenosis. Surgical: Angel Simmons  has a past surgical history that includes Percutaneous pinning of a right femoral neck fracture; Cholecystectomy; Hemorrhoid surgery; Cataract extraction, bilateral; and Hip Arthroplasty (Left, 04/05/2015). Family: family history includes Breast cancer in her mother; Dementia in her father.  Constitutional Exam  General appearance: Well nourished, well developed, and well hydrated. In no apparent acute distress There were no vitals filed for this visit. BMI Assessment: Estimated body mass index is 18.8 kg/m as calculated from the following:   Height as of 09/19/17: 5' 5"  (1.651 m).   Weight as of 09/19/17: 113 lb (51.3 kg).  BMI interpretation table: BMI level Category Range association with higher incidence of chronic pain  <18 kg/m2 Underweight   18.5-24.9 kg/m2 Ideal body weight   25-29.9 kg/m2 Overweight Increased incidence by 20%  30-34.9 kg/m2 Obese (Class I) Increased incidence by 68%  35-39.9 kg/m2 Severe obesity (Class II) Increased incidence by 136%  >40 kg/m2 Extreme obesity (Class III) Increased incidence by 254%   Patient's current BMI Ideal Body weight  There is no height or weight on file to calculate BMI. Patient weight not recorded   BMI Readings from Last 4 Encounters:  09/19/17 18.80 kg/m  09/12/17 19.98 kg/m  09/07/16 20.11 kg/m  04/04/15 22.76 kg/m   Wt Readings from Last 4 Encounters:  09/19/17 113 lb (51.3 kg)  09/12/17 116 lb 6.4 oz (52.8 kg)  09/07/16 119 lb (54 kg)  04/04/15 132 lb 9.6 oz (60.1 kg)  Psych/Mental status: Alert, oriented x 3 (person, place, & time)       Eyes: PERLA Respiratory: No evidence of acute respiratory distress  Cervical Spine Area Exam  Skin & Axial Inspection: No masses, redness, edema, swelling, or associated skin  lesions Alignment: Symmetrical Functional ROM: Unrestricted ROM      Stability: No instability detected Muscle Tone/Strength: Functionally intact. No obvious neuro-muscular anomalies detected. Sensory (Neurological): Unimpaired Palpation: No palpable anomalies              Upper Extremity (UE) Exam    Side: Right upper extremity  Side: Left upper extremity  Skin & Extremity Inspection: Skin color, temperature, and hair growth are WNL. No peripheral edema or cyanosis. No masses, redness, swelling, asymmetry, or associated skin lesions. No contractures.  Skin & Extremity Inspection: Skin color, temperature, and hair growth are WNL. No peripheral edema or cyanosis. No masses, redness, swelling, asymmetry, or associated skin lesions. No contractures.  Functional ROM: Unrestricted ROM          Functional ROM: Unrestricted ROM          Muscle Tone/Strength: Functionally intact. No obvious neuro-muscular anomalies detected.  Muscle Tone/Strength: Functionally intact. No obvious neuro-muscular anomalies detected.  Sensory (Neurological): Unimpaired          Sensory (Neurological): Unimpaired          Palpation: No palpable anomalies              Palpation: No palpable anomalies              Provocative Test(s):  Phalen's test: deferred Tinel's test: deferred Apley's scratch test (touch opposite shoulder):  Action 1 (Across chest): deferred Action 2 (Overhead): deferred Action 3 (LB reach): deferred   Provocative Test(s):  Phalen's test: deferred Tinel's test: deferred Apley's scratch test (touch opposite shoulder):  Action 1 (Across chest): deferred Action 2 (Overhead): deferred Action 3 (LB reach):  deferred    Thoracic Spine Area Exam  Skin & Axial Inspection: No masses, redness, or swelling Alignment: Symmetrical Functional ROM: Unrestricted ROM Stability: No instability detected Muscle Tone/Strength: Functionally intact. No obvious neuro-muscular anomalies detected. Sensory  (Neurological): Unimpaired Muscle strength & Tone: No palpable anomalies  Lumbar Spine Area Exam  Skin & Axial Inspection: No masses, redness, or swelling Alignment: Symmetrical Functional ROM: Unrestricted ROM       Stability: No instability detected Muscle Tone/Strength: Functionally intact. No obvious neuro-muscular anomalies detected. Sensory (Neurological): Unimpaired Palpation: No palpable anomalies       Provocative Tests: Lumbar Hyperextension/rotation test: deferred today       Lumbar quadrant test (Kemp's test): deferred today       Lumbar Lateral bending test: deferred today       Patrick's Maneuver: deferred today                   FABER test: deferred today       Thigh-thrust test: deferred today       S-I compression test: deferred today       S-I distraction test: deferred today        Gait & Posture Assessment  Ambulation: Unassisted Gait: Relatively normal for age and body habitus Posture: WNL   Lower Extremity Exam    Side: Right lower extremity  Side: Left lower extremity  Stability: No instability observed          Stability: No instability observed          Skin & Extremity Inspection: Skin color, temperature, and hair growth are WNL. No peripheral edema or cyanosis. No masses, redness, swelling, asymmetry, or associated skin lesions. No contractures.  Skin & Extremity Inspection: Skin color, temperature, and hair growth are WNL. No peripheral edema or cyanosis. No masses, redness, swelling, asymmetry, or associated skin lesions. No contractures.  Functional ROM: Unrestricted ROM                  Functional ROM: Unrestricted ROM                  Muscle Tone/Strength: Functionally intact. No obvious neuro-muscular anomalies detected.  Muscle Tone/Strength: Functionally intact. No obvious neuro-muscular anomalies detected.  Sensory (Neurological): Unimpaired  Sensory (Neurological): Unimpaired  Palpation: No palpable anomalies  Palpation: No palpable anomalies    Assessment  Primary Diagnosis & Pertinent Problem List: The primary encounter diagnosis was Acute right lumbar radiculopathy. 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  Status Diagnosis  Controlled Controlled Controlled 1. Acute right lumbar radiculopathy   2. 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 Thigh shingles (Right)   4. Post herpetic neuralgia   5. Chronic low back pain (Secondary Area of Pain) (Bilateral) with sciatica (Right)     Problems updated and reviewed during this visit: No problems updated. Plan of Care  Pharmacotherapy (Medications Ordered): No orders of the defined types were placed in this encounter.  Medications administered today: Angel Simmons had no medications administered during this visit.  Procedure Orders    No procedure(s) ordered today   Lab Orders  No laboratory test(s) ordered today   Imaging Orders  No imaging studies ordered today   Referral Orders  No referral(s) requested today    Interventional management options: Planned, scheduled,  and/or pending:   Therapeutic right-sided caudal LESI #2 + right sided L4-5 interlaminar LESI #2 under fluoroscopic guidance, no sedation   Considering:   Therapeutic right-sided caudal LESI #2 + right sided L4-5 interlaminar LESI #2 under fluoroscopic guidance, no sedation   Palliative PRN treatment(s):   None at this time   Provider-requested follow-up: No follow-ups on file.  Future Appointments  Date Time Provider [place]  10/14/2017  2:00 PM Milinda Pointer, MD [facility] None   Primary Care Physician: Cletis Athens, MD Location: [facility] Note by: Gaspar Cola, MD Date: 10/14/2017; Time: 8:26 PM

## 2017-10-14 ENCOUNTER — Ambulatory Visit: Payer: Medicare Other | Admitting: Pain Medicine

## 2018-01-21 ENCOUNTER — Ambulatory Visit (INDEPENDENT_AMBULATORY_CARE_PROVIDER_SITE_OTHER): Payer: Medicare Other | Admitting: Vascular Surgery

## 2018-01-21 ENCOUNTER — Encounter (INDEPENDENT_AMBULATORY_CARE_PROVIDER_SITE_OTHER): Payer: Self-pay | Admitting: Vascular Surgery

## 2018-01-21 VITALS — BP 187/93 | HR 102 | Resp 15 | Ht 61.0 in | Wt 107.0 lb

## 2018-01-21 DIAGNOSIS — M5136 Other intervertebral disc degeneration, lumbar region: Secondary | ICD-10-CM | POA: Diagnosis not present

## 2018-01-21 DIAGNOSIS — M79604 Pain in right leg: Secondary | ICD-10-CM | POA: Diagnosis not present

## 2018-01-21 DIAGNOSIS — R600 Localized edema: Secondary | ICD-10-CM | POA: Diagnosis not present

## 2018-01-21 DIAGNOSIS — M79605 Pain in left leg: Secondary | ICD-10-CM | POA: Diagnosis not present

## 2018-01-21 NOTE — Progress Notes (Signed)
Subjective:    Patient ID: Angel Simmons, female    DOB: 09-02-1928, 84 y.o.   MRN: [id number] Chief Complaint  Patient presents with  . New Patient (Initial Visit)    LLE Edema   Presents as a new patient referred by doctors making house calls for left lower extremity edema.  The patient is seen with her daughter.  The patient has recently transitioned back home from a rehabilitation facility.  The patient was recently diagnosed with herpes zoster and spent some time as an inpatient at [facility].  The patient notes experiencing swelling to the bilateral lower extremities especially the left lower extremity over the last few weeks.  The patient does not engage in conservative therapy at this time including wearing medical grade 1 compression socks or remaining active.  The patient does elevate her legs.  The patient does have an extensive osteo-arthritis and degenerative joint disease history of the spine.  The patient is receiving treatment via a pain clinic for her discomfort.  The edema to her bilateral lower extremity does worsen her discomfort.  Doctors making house calls ordered a left lower extremity arterial ultrasound which was notable for no evidence of focal hemodynamically significant stenosis of the left lower extremity arterial system on January 02, 2018.  The patient denies any claudication-like symptoms to the muscles of the lower extremity or rest pain.  Patient denies any ulcer formation to the bilateral legs.  Patient denies any recent surgery or trauma, DVT history or immobility.  Patient denies any fever, nausea vomiting.  Review of Systems  Constitutional: Negative.   HENT: Negative.   Eyes: Negative.   Respiratory: Negative.   Cardiovascular: Positive for leg swelling.  Gastrointestinal: Negative.   Endocrine: Negative.   Genitourinary: Negative.   Musculoskeletal: Negative.   Skin: Negative.   Allergic/Immunologic: Negative.   Neurological:  Negative.   Hematological: Negative.   Psychiatric/Behavioral: Negative.       Objective:   Physical Exam  Constitutional: She is oriented to person, place, and time. She appears well-developed and well-nourished. No distress.  HENT:  Head: Normocephalic and atraumatic.  Right Ear: External ear normal.  Left Ear: External ear normal.  Mouth/Throat: Oropharynx is clear and moist.  Eyes: Pupils are equal, round, and reactive to light. Conjunctivae and EOM are normal.  Neck: Normal range of motion.  Cardiovascular: Normal rate, regular rhythm, normal heart sounds and intact distal pulses.  Pulses:      Radial pulses are 2+ on the right side, and 2+ on the left side.  Hard to palpate pedal pulses however the bilateral feet are warm  Pulmonary/Chest: Effort normal and breath sounds normal.  Musculoskeletal: Normal range of motion. She exhibits no edema (Minimal nonpitting edema noted to the bilateral legs).  Neurological: She is alert and oriented to person, place, and time.  Skin: Skin is warm and dry. She is not diaphoretic.  Mild stasis dermatitis noted bilaterally.  No fibrosis, cellulitis or active ulcerations noted at this time.  Scattered less than 1 cm varicosities noted to the bilateral lower extremity.  Psychiatric: She has a normal mood and affect. Her behavior is normal. Judgment and thought content normal.  Vitals reviewed.  BP (!) 187/93 (BP Location: Left Arm, Patient Position: Sitting)   Pulse (!) 102   Resp 15   Ht 5\' 1"  (1.549 m)   Wt 107 lb (48.5 kg)   BMI 20.22 kg/m   Past Medical History:  Diagnosis Date  .  Chronic back pain   . Hip fx (HCC)   . Hypertension   . Rheumatoid arthritis (HCC)   . Spinal stenosis    Social History   Socioeconomic History  . Marital status: Widowed    Spouse name: Not on file  . Number of children: Not on file  . Years of education: Not on file  . Highest education level: Not on file  Occupational History  . Not on file    Social Needs  . Financial resource strain: Not on file  . Food insecurity:    Worry: Not on file    Inability: Not on file  . Transportation needs:    Medical: Not on file    Non-medical: Not on file  Tobacco Use  . Smoking status: Never Smoker  . Smokeless tobacco: Never Used  Substance and Sexual Activity  . Alcohol use: No  . Drug use: No  . Sexual activity: Not on file  Lifestyle  . Physical activity:    Days per week: Not on file    Minutes per session: Not on file  . Stress: Not on file  Relationships  . Social connections:    Talks on phone: Not on file    Gets together: Not on file    Attends religious service: Not on file    Active member of club or organization: Not on file    Attends meetings of clubs or organizations: Not on file    Relationship status: Not on file  . Intimate partner violence:    Fear of current or ex partner: Not on file    Emotionally abused: Not on file    Physically abused: Not on file    Forced sexual activity: Not on file  Other Topics Concern  . Not on file  Social History Narrative   Lives independently at home. Ambulates with the help of a cane, occasionally uses a walker.   Past Surgical History:  Procedure Laterality Date  . CATARACT EXTRACTION, BILATERAL    . CHOLECYSTECTOMY    . HEMORRHOID SURGERY    . HIP ARTHROPLASTY Left 04/05/2015   Procedure: ARTHROPLASTY BIPOLAR HIP (HEMIARTHROPLASTY);  Surgeon: Donato Heinz, MD;  Location: [facility] ORS;  Service: Orthopedics;  Laterality: Left;  . Percutaneous pinning of a right femoral neck fracture     Family History  Problem Relation Age of Onset  . Breast cancer Mother   . Dementia Father    Allergies  Allergen Reactions  . Tylenol [Acetaminophen] Other (See Comments)    Reaction:  Headaches       Assessment & Plan:  Presents as a new patient referred by doctors making house calls for left lower extremity edema.  The patient is seen with her daughter.  The patient has  recently transitioned back home from a rehabilitation facility.  The patient was recently diagnosed with herpes zoster and spent some time as an inpatient at [facility].  The patient notes experiencing swelling to the bilateral lower extremities especially the left lower extremity over the last few weeks.  The patient does not engage in conservative therapy at this time including wearing medical grade 1 compression socks or remaining active.  The patient does elevate her legs.  The patient does have an extensive osteo-arthritis and degenerative joint disease history of the spine.  The patient is receiving treatment via a pain clinic for her discomfort.  The edema to her bilateral lower extremity does worsen her discomfort.  Doctors making  house calls ordered a left lower extremity arterial ultrasound which was notable for no evidence of focal hemodynamically significant stenosis of the left lower extremity arterial system on January 02, 2018.  The patient denies any claudication-like symptoms to the muscles of the lower extremity or rest pain.  Patient denies any ulcer formation to the bilateral legs.  Patient denies any recent surgery or trauma, DVT history or immobility.  Patient denies any fever, nausea vomiting.  1. Bilateral lower extremity edema - New The patient was encouraged to wear graduated compression stockings (20-30 mmHg) on a daily basis. The patient was instructed to begin wearing the stockings first thing in the morning and removing them in the evening. The patient was instructed specifically not to sleep in the stockings. Prescription given. In addition, behavioral modification including elevation during the day will be initiated. We will bring the patient back and have her undergo bilateral lower extremity venous duplex to rule out any contributing venous versus lymphatic disease  The patient will follow up in one month to asses conservative management.  Information on  chronic venous compression stockings was given to the patient. The patient was instructed to call the office in the interim if any worsening edema or ulcerations to the legs, feet or toes occurs. The patient expresses their understanding.  - VAS [place] LOWER EXTREMITY VENOUS REFLUX; Future  2. Lower extremity pain, bilateral - Stable The patient does have an extensive history of osteoarthritis and degenerative joint disease to the spine for which she is receiving treatment through a pain clinic. This is most likely the source of any lateral lower extremity discomfort. I will bring the patient back and have her undergo bilateral ABI and attempt to assess for any contributing peripheral artery disease.  - VAS [place] ABI WITH/WO TBI; Future  3. DDD (degenerative disc disease), lumbar - Stable The patient does have an extensive history of osteoarthritis and degenerative joint disease to the spine for which she is receiving treatment through a pain clinic.  Current Outpatient Medications on File Prior to Visit  Medication Sig Dispense Refill  . Calcium Carb-Cholecalciferol (CALCIUM 1000 + D) 1000-800 MG-UNIT TABS Take 1 tablet by mouth daily.    . Cholecalciferol (VITAMIN D3) 2000 units capsule Take 4,000 Units by mouth daily.    . cyproheptadine (PERIACTIN) 4 MG tablet TK 1 T PO QD  4  . Docusate Sodium (DSS) 100 MG CAPS Take 1 capsule by mouth daily.    . DULoxetine (CYMBALTA) 30 MG capsule Take 30 mg by mouth daily.    . Ferrous Fumarate (HEMOCYTE - 106 MG FE) 324 (106 Fe) MG TABS tablet Take 1 tablet by mouth 2 (two) times daily.    Marland Kitchen gabapentin (NEURONTIN) 100 MG capsule Take 100 mg by mouth 3 (three) times daily.    Marland Kitchen gabapentin (NEURONTIN) 300 MG capsule TK ONE C PO BID  4  . lidocaine (LIDODERM) 5 % Place 1 patch onto the skin daily. Remove & Discard patch within 12 hours or as directed by MD 30 patch 0  . lisinopril (PRINIVIL,ZESTRIL) 20 MG tablet Take 20 mg by mouth daily.  4  . memantine  (NAMENDA) 5 MG tablet Take 2.5-5 mg by mouth 2 (two) times daily. 1/2 tablet in the morning and 1 tablet in the evening    . mirtazapine (REMERON) 15 MG tablet Take 15 mg by mouth at bedtime.    . Multiple Vitamin (MULTIVITAMIN) capsule Take 1 capsule by mouth daily.    Marland Kitchen  Multiple Vitamins-Minerals (PRESERVISION AREDS 2+MULTI VIT PO) Take 1 tablet by mouth daily.    Marland Kitchen oxyCODONE (OXY IR/ROXICODONE) 5 MG immediate release tablet Take 1 tablet (5 mg total) by mouth every 6 (six) hours as needed for breakthrough pain. 30 tablet 0  . pantoprazole (PROTONIX) 40 MG tablet Take 40 mg by mouth daily.    . predniSONE (DELTASONE) 5 MG tablet Take as directed - 6 day taper    . traZODone (DESYREL) 50 MG tablet Take 0.5 tablets (25 mg total) by mouth at bedtime as needed for sleep. 10 tablet 0  . vitamin C (ASCORBIC ACID) 500 MG tablet Take by mouth.     No current facility-administered medications on file prior to visit.    There are no Patient Instructions on file for this visit. No follow-ups on file.  Kion Huntsberry A Edelin Fryer, PA-C

## 2018-02-25 ENCOUNTER — Ambulatory Visit (INDEPENDENT_AMBULATORY_CARE_PROVIDER_SITE_OTHER): Payer: Medicare Other | Admitting: Vascular Surgery

## 2018-02-25 ENCOUNTER — Encounter (INDEPENDENT_AMBULATORY_CARE_PROVIDER_SITE_OTHER): Payer: Self-pay | Admitting: Vascular Surgery

## 2018-02-25 ENCOUNTER — Ambulatory Visit (INDEPENDENT_AMBULATORY_CARE_PROVIDER_SITE_OTHER): Payer: Medicare Other

## 2018-02-25 VITALS — BP 163/70 | HR 80 | Resp 16 | Ht 65.0 in | Wt 108.0 lb

## 2018-02-25 DIAGNOSIS — M79604 Pain in right leg: Secondary | ICD-10-CM | POA: Diagnosis not present

## 2018-02-25 DIAGNOSIS — I10 Essential (primary) hypertension: Secondary | ICD-10-CM

## 2018-02-25 DIAGNOSIS — M79605 Pain in left leg: Secondary | ICD-10-CM | POA: Diagnosis not present

## 2018-02-25 DIAGNOSIS — R600 Localized edema: Secondary | ICD-10-CM

## 2018-02-25 NOTE — Progress Notes (Signed)
MRN : [id number]  Angel Simmons is a 84 y.o. ([birth year]) female who presents with chief complaint of  Chief Complaint  Patient presents with  . Follow-up    ultrasound follow up  .  History of Present Illness: Patient returns today in follow up of leg pain and swelling.  She has had a little bit of left leg swelling intermittently since her last visit but nothing as severe as what brought her to seek treatment the first time.  No new ulceration or infection.  No fevers or chills.  Noninvasive studies were performed today to assess her arterial and venous systems.  Arterial study shows medial calcification but completely normal waveforms all the way down to the digits consistent with no significant arterial insufficiency.  Her venous system shows no DVT, superficial thrombophlebitis, or significant superficial venous reflux.  Only a short segment of deep venous reflux is seen in the right common femoral vein and saphenofemoral junction.  Current Outpatient Medications  Medication Sig Dispense Refill  . Calcium Carb-Cholecalciferol (CALCIUM 1000 + D) 1000-800 MG-UNIT TABS Take 1 tablet by mouth daily.    . Cholecalciferol (VITAMIN D3) 2000 units capsule Take 4,000 Units by mouth daily.    . cyproheptadine (PERIACTIN) 4 MG tablet TK 1 T PO QD  4  . Docusate Sodium (DSS) 100 MG CAPS Take 1 capsule by mouth daily.    . DULoxetine (CYMBALTA) 30 MG capsule Take 30 mg by mouth daily.    . Ferrous Fumarate (HEMOCYTE - 106 MG FE) 324 (106 Fe) MG TABS tablet Take 1 tablet by mouth 2 (two) times daily.    Marland Kitchen gabapentin (NEURONTIN) 100 MG capsule Take 100 mg by mouth 3 (three) times daily.    Marland Kitchen gabapentin (NEURONTIN) 300 MG capsule TK ONE C PO BID  4  . lidocaine (LIDODERM) 5 % Place 1 patch onto the skin daily. Remove & Discard patch within 12 hours or as directed by MD 30 patch 0  . lisinopril (PRINIVIL,ZESTRIL) 20 MG tablet Take 20 mg by mouth daily.  4  . memantine (NAMENDA) 5 MG tablet Take 2.5-5  mg by mouth 2 (two) times daily. 1/2 tablet in the morning and 1 tablet in the evening    . mirtazapine (REMERON) 15 MG tablet Take 15 mg by mouth at bedtime.    . Multiple Vitamin (MULTIVITAMIN) capsule Take 1 capsule by mouth daily.    . Multiple Vitamins-Minerals (PRESERVISION AREDS 2+MULTI VIT PO) Take 1 tablet by mouth daily.    Marland Kitchen oxyCODONE (OXY IR/ROXICODONE) 5 MG immediate release tablet Take 1 tablet (5 mg total) by mouth every 6 (six) hours as needed for breakthrough pain. 30 tablet 0  . pantoprazole (PROTONIX) 40 MG tablet Take 40 mg by mouth daily.    . predniSONE (DELTASONE) 5 MG tablet Take as directed - 6 day taper    . traZODone (DESYREL) 50 MG tablet Take 0.5 tablets (25 mg total) by mouth at bedtime as needed for sleep. 10 tablet 0  . vitamin C (ASCORBIC ACID) 500 MG tablet Take by mouth.     No current facility-administered medications for this visit.     Past Medical History:  Diagnosis Date  . Chronic back pain   . Hip fx (HCC)   . Hypertension   . Rheumatoid arthritis (HCC)   . Spinal stenosis     Past Surgical History:  Procedure Laterality Date  . CATARACT EXTRACTION, BILATERAL    . CHOLECYSTECTOMY    .  HEMORRHOID SURGERY    . HIP ARTHROPLASTY Left 04/05/2015   Procedure: ARTHROPLASTY BIPOLAR HIP (HEMIARTHROPLASTY);  Surgeon: Donato Heinz, MD;  Location: [facility] ORS;  Service: Orthopedics;  Laterality: Left;  . Percutaneous pinning of a right femoral neck fracture      Social History Social History   Tobacco Use  . Smoking status: Never Smoker  . Smokeless tobacco: Never Used  Substance Use Topics  . Alcohol use: No  . Drug use: No    Family History Family History  Problem Relation Age of Onset  . Breast cancer Mother   . Dementia Father     Allergies  Allergen Reactions  . Tylenol [Acetaminophen] Other (See Comments)    Reaction:  Headaches      REVIEW OF SYSTEMS (Negative unless checked)  Constitutional: [] Weight loss  [] Fever   [] Chills Cardiac: [] Chest pain   [] Chest pressure   [] Palpitations   [] Shortness of breath when laying flat   [] Shortness of breath at rest   [] Shortness of breath with exertion. Vascular:  [] Pain in legs with walking   [] Pain in legs at rest   [] Pain in legs when laying flat   [] Claudication   [] Pain in feet when walking  [] Pain in feet at rest  [] Pain in feet when laying flat   [] History of DVT   [] Phlebitis   [x] Swelling in legs   [] Varicose veins   [] Non-healing ulcers Pulmonary:   [] Uses home oxygen   [] Productive cough   [] Hemoptysis   [] Wheeze  [] COPD   [] Asthma Neurologic:  [] Dizziness  [] Blackouts   [] Seizures   [] History of stroke   [] History of TIA  [] Aphasia   [] Temporary blindness   [] Dysphagia   [] Weakness or numbness in arms   [] Weakness or numbness in legs Musculoskeletal:  [x] Arthritis   [] Joint swelling   [] Joint pain   [x] Low back pain Hematologic:  [] Easy bruising  [] Easy bleeding   [] Hypercoagulable state   [] Anemic   Gastrointestinal:  [] Blood in stool   [] Vomiting blood  [x] Gastroesophageal reflux/heartburn   [] Abdominal pain Genitourinary:  [] Chronic kidney disease   [] Difficult urination  [] Frequent urination  [] Burning with urination   [] Hematuria Skin:  [] Rashes   [] Ulcers   [] Wounds Psychological:  [] History of anxiety   []  History of major depression.  Physical Examination  BP (!) 163/70 (BP Location: Right Arm)   Pulse 80   Resp 16   Ht 5\' 5"  (1.651 m)   Wt 108 lb (49 kg)   BMI 17.97 kg/m  Gen:  WD/WN, NAD. Appears younger than stated age. Head: [place]/AT, No temporalis wasting. Ear/Nose/Throat: Hearing grossly intact, nares w/o erythema or drainage Eyes: Conjunctiva clear. Sclera non-icteric Neck: Supple.  Trachea midline Pulmonary:  Good air movement, no use of accessory muscles.  Cardiac: RRR, no JVD Vascular:  Vessel Right Left  Radial Palpable Palpable                                   Gastrointestinal: soft, non-tender/non-distended. No  guarding/reflex.  Musculoskeletal: M/S 5/5 throughout.  No deformity or atrophy. Uses a walker No appreciable edema. Neurologic: Sensation grossly intact in extremities.  Symmetrical.  Speech is fluent.  Psychiatric: Judgment intact, Mood & affect appropriate for pt's clinical situation. Dermatologic: No rashes or ulcers noted.  No cellulitis or open wounds.       Labs No results found for this or any previous visit (from the  past 2160 hour(s)).  Radiology No results found.  Assessment/Plan  Essential hypertension blood pressure control important in reducing the progression of atherosclerotic disease. On appropriate oral medications.   Lower extremity pain, bilateral Noninvasive studies were performed today to assess her arterial and venous systems.  Arterial study shows medial calcification but completely normal waveforms all the way down to the digits consistent with no significant arterial insufficiency.  Her venous system shows no DVT, superficial thrombophlebitis, or significant superficial venous reflux.  Only a short segment of deep venous reflux is seen in the right common femoral vein and saphenofemoral junction. I do not see an obvious vascular cause of her symptoms.  I would recommend she wear compression stockings, maintain her activity as much as tolerated, and elevate her legs as possible.  No role for intervention.  She can return on an as-needed basis.    Festus Barren, MD  02/25/2018 3:06 PM    This note was created with Dragon medical transcription system.  Any errors from dictation are purely unintentional

## 2018-02-25 NOTE — Assessment & Plan Note (Signed)
Noninvasive studies were performed today to assess her arterial and venous systems.  Arterial study shows medial calcification but completely normal waveforms all the way down to the digits consistent with no significant arterial insufficiency.  Her venous system shows no DVT, superficial thrombophlebitis, or significant superficial venous reflux.  Only a short segment of deep venous reflux is seen in the right common femoral vein and saphenofemoral junction. I do not see an obvious vascular cause of her symptoms.  I would recommend she wear compression stockings, maintain her activity as much as tolerated, and elevate her legs as possible.  No role for intervention.  She can return on an as-needed basis.

## 2018-02-25 NOTE — Assessment & Plan Note (Signed)
blood pressure control important in reducing the progression of atherosclerotic disease. On appropriate oral medications.  

## 2019-06-08 ENCOUNTER — Emergency Department: Payer: Medicare Other

## 2019-06-08 ENCOUNTER — Other Ambulatory Visit: Payer: Self-pay

## 2019-06-08 ENCOUNTER — Inpatient Hospital Stay
Admission: EM | Admit: 2019-06-08 | Discharge: 2019-06-18 | DRG: 177 | Disposition: A | Discharge status: Skilled Nursing Facility | Payer: Medicare Other | Attending: Hospitalist | Admitting: Hospitalist

## 2019-06-08 ENCOUNTER — Encounter: Payer: Self-pay | Admitting: Emergency Medicine

## 2019-06-08 DIAGNOSIS — Z681 Body mass index (BMI) 19 or less, adult: Secondary | ICD-10-CM

## 2019-06-08 DIAGNOSIS — G9341 Metabolic encephalopathy: Secondary | ICD-10-CM | POA: Diagnosis not present

## 2019-06-08 DIAGNOSIS — Z66 Do not resuscitate: Secondary | ICD-10-CM | POA: Diagnosis not present

## 2019-06-08 DIAGNOSIS — U071 COVID-19: Secondary | ICD-10-CM | POA: Diagnosis not present

## 2019-06-08 DIAGNOSIS — Z79899 Other long term (current) drug therapy: Secondary | ICD-10-CM

## 2019-06-08 DIAGNOSIS — R778 Other specified abnormalities of plasma proteins: Secondary | ICD-10-CM

## 2019-06-08 DIAGNOSIS — N1831 Chronic kidney disease, stage 3a: Secondary | ICD-10-CM | POA: Diagnosis present

## 2019-06-08 DIAGNOSIS — E876 Hypokalemia: Secondary | ICD-10-CM | POA: Diagnosis not present

## 2019-06-08 DIAGNOSIS — I1310 Hypertensive heart and chronic kidney disease without heart failure, with stage 1 through stage 4 chronic kidney disease, or unspecified chronic kidney disease: Secondary | ICD-10-CM | POA: Diagnosis present

## 2019-06-08 DIAGNOSIS — K219 Gastro-esophageal reflux disease without esophagitis: Secondary | ICD-10-CM | POA: Diagnosis present

## 2019-06-08 DIAGNOSIS — R7989 Other specified abnormal findings of blood chemistry: Secondary | ICD-10-CM

## 2019-06-08 DIAGNOSIS — R05 Cough: Secondary | ICD-10-CM

## 2019-06-08 DIAGNOSIS — M069 Rheumatoid arthritis, unspecified: Secondary | ICD-10-CM | POA: Diagnosis present

## 2019-06-08 DIAGNOSIS — I10 Essential (primary) hypertension: Secondary | ICD-10-CM | POA: Diagnosis present

## 2019-06-08 DIAGNOSIS — R531 Weakness: Secondary | ICD-10-CM

## 2019-06-08 DIAGNOSIS — J1282 Pneumonia due to coronavirus disease 2019: Secondary | ICD-10-CM | POA: Diagnosis present

## 2019-06-08 DIAGNOSIS — R0902 Hypoxemia: Secondary | ICD-10-CM | POA: Diagnosis present

## 2019-06-08 DIAGNOSIS — Z96642 Presence of left artificial hip joint: Secondary | ICD-10-CM | POA: Diagnosis present

## 2019-06-08 DIAGNOSIS — R059 Cough, unspecified: Secondary | ICD-10-CM

## 2019-06-08 DIAGNOSIS — Z803 Family history of malignant neoplasm of breast: Secondary | ICD-10-CM

## 2019-06-08 DIAGNOSIS — F0390 Unspecified dementia without behavioral disturbance: Secondary | ICD-10-CM

## 2019-06-08 DIAGNOSIS — E43 Unspecified severe protein-calorie malnutrition: Secondary | ICD-10-CM | POA: Diagnosis present

## 2019-06-08 LAB — CBC
HCT: 36.1 % (ref 36.0–46.0)
Hemoglobin: 12 g/dL (ref 12.0–15.0)
MCH: 32.8 pg (ref 26.0–34.0)
MCHC: 33.2 g/dL (ref 30.0–36.0)
MCV: 98.6 fL (ref 80.0–100.0)
Platelets: 172 10*3/uL (ref 150–400)
RBC: 3.66 MIL/uL — ABNORMAL LOW (ref 3.87–5.11)
RDW: 12.4 % (ref 11.5–15.5)
WBC: 8 10*3/uL (ref 4.0–10.5)
nRBC: 0 % (ref 0.0–0.2)

## 2019-06-08 NOTE — ED Provider Notes (Signed)
[facility] Emergency Department Provider Note   ____________________________________________   First MD Initiated Contact with Patient 06/08/19 2339     (approximate)  I have reviewed the triage vital signs and the nursing notes.   HISTORY  Chief Complaint Weakness  Level V caveat: Limited by dementia History obtained via patient's daughter  HPI VERNIS CABACUNGAN is a 86 y.o. female brought to the ED from home by her daughter with a chief complaint of generalized weakness.  Patient with a history of hypertension, rheumatoid arthritis, dementia who lives with her daughter and son-in-law.  Son-in-law recently diagnosed with COVID-19.  Daughter reports patient has been progressively weak throughout the day and this evening could not stand by herself which is unusual for her.  Daughter does note patient with a cough.  Denies fever, chest pain, shortness of breath, abdominal pain, nausea, vomiting or diarrhea.       Past Medical History:  Diagnosis Date  . Chronic back pain   . Hip fx (HCC)   . Hypertension   . Rheumatoid arthritis (HCC)   . Spinal stenosis     Patient Active Problem List   Diagnosis Date Noted  . Bilateral lower extremity edema 01/21/2018  . Lower extremity pain, bilateral 01/21/2018  . Acute right lumbar radiculopathy 09/19/2017  . Thigh shingles (Right) 09/19/2017  . 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 Spondylosis without myelopathy or radiculopathy, lumbosacral region 09/19/2017  . Lumbar facet syndrome (Bilateral) 09/19/2017  . Abnormal MRI, lumbar spine (12/21/2013) 09/19/2017  . Post herpetic neuralgia 09/12/2017  . Acute encephalopathy 09/12/2017  . UTI (urinary tract infection) 09/12/2017  . HTN (hypertension) 09/12/2017  . Chronic pain of multiple joints 02/06/2017  . At high risk for falls 06/08/2016  . History of fall 06/08/2016  . Major neurocognitive disorder due to  multiple etiologies (HCC) 06/08/2016  . Ulcer of esophagus with bleeding 11/14/2015  . C. difficile diarrhea 11/13/2015  . Gastroesophageal reflux disease without esophagitis 11/12/2015  . Melanotic stools 11/12/2015  . Essential hypertension 11/12/2015  . S/P hip hemiarthroplasty 07/02/2015  . Fracture of left hip requiring operative repair (HCC) 05/17/2015  . HNP (herniated nucleus pulposus), lumbar 04/26/2015  . Hip fracture (HCC) 04/04/2015  . Chronic LBP 09/25/2013  . Degeneration of intervertebral disc of lumbar region 09/25/2013  . Neuritis or radiculitis due to rupture of lumbar intervertebral disc 09/25/2013  . Degenerative arthritis of lumbar spine 09/25/2013  . Lumbar radiculitis 09/25/2013  . Chronic low back pain (Secondary Area of Pain) (Bilateral) with sciatica (Right) 09/25/2013  . DDD (degenerative disc disease), lumbar 09/25/2013  . Lumbar spondylosis 09/25/2013    Past Surgical History:  Procedure Laterality Date  . CATARACT EXTRACTION, BILATERAL    . CHOLECYSTECTOMY    . HEMORRHOID SURGERY    . HIP ARTHROPLASTY Left 04/05/2015   Procedure: ARTHROPLASTY BIPOLAR HIP (HEMIARTHROPLASTY);  Surgeon: Donato Heinz, MD;  Location: [facility] ORS;  Service: Orthopedics;  Laterality: Left;  . Percutaneous pinning of a right femoral neck fracture      Prior to Admission medications   Medication Sig Start Date End Date Taking? Authorizing Provider  Calcium Carb-Cholecalciferol (CALCIUM 1000 + D) 1000-800 MG-UNIT TABS Take 1 tablet by mouth daily.    [provider]  Cholecalciferol (VITAMIN D3) 2000 units capsule Take 4,000 Units by mouth daily.    [provider]  cyproheptadine (PERIACTIN) 4 MG tablet TK 1 T PO QD 11/20/17   [provider]  Docusate Sodium (DSS) 100 MG CAPS Take 1 capsule by mouth daily. 11/15/15   [provider]  DULoxetine (CYMBALTA) 30 MG capsule Take 30 mg by mouth daily.    [provider]  Ferrous Fumarate  (HEMOCYTE - 106 MG FE) 324 (106 Fe) MG TABS tablet Take 1 tablet by mouth 2 (two) times daily. 11/15/15   [provider]  gabapentin (NEURONTIN) 100 MG capsule Take 100 mg by mouth 3 (three) times daily.    [provider]  gabapentin (NEURONTIN) 300 MG capsule TK ONE C PO BID 11/20/17   [provider]  lidocaine (LIDODERM) 5 % Place 1 patch onto the skin daily. Remove & Discard patch within 12 hours or as directed by MD 09/14/17   Enedina Finner, MD  lisinopril (PRINIVIL,ZESTRIL) 20 MG tablet Take 20 mg by mouth daily. 08/30/17   [provider]  memantine (NAMENDA) 5 MG tablet Take 2.5-5 mg by mouth 2 (two) times daily. 1/2 tablet in the morning and 1 tablet in the evening    [provider]  mirtazapine (REMERON) 15 MG tablet Take 15 mg by mouth at bedtime.    [provider]  Multiple Vitamin (MULTIVITAMIN) capsule Take 1 capsule by mouth daily.    [provider]  Multiple Vitamins-Minerals (PRESERVISION AREDS 2+MULTI VIT PO) Take 1 tablet by mouth daily.    [provider]  oxyCODONE (OXY IR/ROXICODONE) 5 MG immediate release tablet Take 1 tablet (5 mg total) by mouth every 6 (six) hours as needed for breakthrough pain. 09/13/17   Enedina Finner, MD  pantoprazole (PROTONIX) 40 MG tablet Take 40 mg by mouth daily.    [provider]  predniSONE (DELTASONE) 5 MG tablet Take as directed - 6 day taper 08/16/17   [provider]  traZODone (DESYREL) 50 MG tablet Take 0.5 tablets (25 mg total) by mouth at bedtime as needed for sleep. 09/13/17   Enedina Finner, MD  vitamin C (ASCORBIC ACID) 500 MG tablet Take by mouth.    [provider]    Allergies Tylenol [acetaminophen]  Family History  Problem Relation Age of Onset  . Breast cancer Mother   . Dementia Father     Social History Social History   Tobacco Use  . Smoking status: Never Smoker  . Smokeless tobacco: Never Used  Substance Use Topics  . Alcohol  use: No  . Drug use: No    Review of Systems  Constitutional: Positive for generalized weakness.  No fever/chills Eyes: No visual changes. ENT: No sore throat. Cardiovascular: Denies chest pain. Respiratory: Positive for cough.  Denies shortness of breath. Gastrointestinal: No abdominal pain.  No nausea, no vomiting.  No diarrhea.  No constipation. Genitourinary: Negative for dysuria. Musculoskeletal: Negative for back pain. Skin: Negative for rash. Neurological: Negative for headaches, focal weakness or numbness.   ____________________________________________   PHYSICAL EXAM:  VITAL SIGNS: ED Triage Vitals [06/08/19 2308]  Enc Vitals Group     BP (!) 165/76     Pulse Rate 88     Resp 16     Temp 99 F (37.2 C)     Temp Source Oral     SpO2 95 %     Weight      Height      Head Circumference      Peak Flow      Pain Score      Pain Loc      Pain Edu?  Excl. in [place]?     Constitutional: Alert and oriented.  Elderly appearing and in mild acute distress. Eyes: Conjunctivae are normal. PERRL. EOMI. Head: Atraumatic. Nose: No congestion/rhinnorhea. Mouth/Throat: Mucous membranes are mildly dry. Neck: No stridor.  Supple neck without meningismus. Cardiovascular: Normal rate, regular rhythm. Grossly normal heart sounds.  Good peripheral circulation. Respiratory: Increased respiratory effort.  No retractions.  Gastrointestinal: Soft and nontender. No distention. No abdominal bruits. No CVA tenderness. Musculoskeletal: No lower extremity tenderness nor edema.  No joint effusions. Neurologic:  Normal speech and language. No gross focal neurologic deficits are appreciated.  Skin:  Skin is warm, dry and intact. No rash noted.  No petechiae. Psychiatric: Mood and affect are normal. Speech and behavior are normal.  ____________________________________________   LABS (all labs ordered are listed, but only abnormal results are displayed)  Labs Reviewed  CBC -  Abnormal; Notable for the following components:      Result Value   RBC 3.66 (*)    All other components within normal limits  COMPREHENSIVE METABOLIC PANEL - Abnormal; Notable for the following components:   Sodium 134 (*)    Glucose, Bld 114 (*)    GFR calc non Af Amer 53 (*)    All other components within normal limits  POC SARS CORONAVIRUS 2 AG - Abnormal; Notable for the following components:   SARS Coronavirus 2 Ag POSITIVE (*)    All other components within normal limits  TROPONIN I (HIGH SENSITIVITY) - Abnormal; Notable for the following components:   Troponin I (High Sensitivity) 82 (*)    All other components within normal limits  URINALYSIS, COMPLETE (UACMP) WITH MICROSCOPIC  C-REACTIVE PROTEIN  FIBRIN DERIVATIVES D-DIMER ([facility] ONLY)  FERRITIN  FIBRINOGEN  LACTATE DEHYDROGENASE  PROCALCITONIN  CBG MONITORING, ED  POC SARS CORONAVIRUS 2 AG -  ED  ABO/RH   ____________________________________________  EKG  ED ECG REPORT I, Disney Ruggiero J, the attending physician, personally viewed and interpreted this ECG.   Date: 06/09/2019  EKG Time: 2311  Rate: 82  Rhythm: normal EKG, normal sinus rhythm  Axis: LAD  Intervals:Bifascicular block  ST&T Change: Nonspecific No significant change from May 2019 ____________________________________________  [place]  ED MD interpretation: No pneumonia  Official radiology report(s): DG Chest 1 View  Result Date: 06/08/2019 CLINICAL DATA:  Weakness, cough EXAM: CHEST  1 VIEW COMPARISON:  09/12/2017 FINDINGS: Cardiomegaly. Dense mitral valve annular calcifications. No confluent airspace opacities or effusions. No acute bony abnormality. IMPRESSION: Cardiomegaly.  No acute cardiopulmonary disease. Electronically Signed   By: Rolm Baptise M.D.   On: 06/08/2019 23:40    ____________________________________________   PROCEDURES  Procedure(s) performed (including Critical  Care):  Procedures   ____________________________________________   INITIAL IMPRESSION / ASSESSMENT AND PLAN / ED COURSE  As part of my medical decision making, I reviewed the following data within the [place] History obtained from family, Nursing notes reviewed and incorporated, Labs reviewed, EKG interpreted, Old chart reviewed, Radiograph reviewed, Discussed with admitting physician and Notes from prior ED visits     MELAYA HOSELTON was evaluated in Emergency Department on 06/09/2019 for the symptoms described in the history of present illness. She was evaluated in the context of the global COVID-19 pandemic, which necessitated consideration that the patient might be at risk for infection with the SARS-CoV-2 virus that causes COVID-19. Institutional protocols and algorithms that pertain to the evaluation of patients at risk for COVID-19 are in a state of rapid change based on information  released by regulatory bodies including the CDC and federal and state organizations. These policies and algorithms were followed during the patient's care in the ED.    86 year old female who presents with generalized weakness in the setting of exposure to COVID-19.  Differential diagnosis includes but is not limited to viral process such as COVID-19, community-acquired pneumonia, UTI, ACS, etc.  Patient required heavy assistance to assist her from wheelchair to stretcher.  Rapid Covid antigen is POSITIVE.  Troponin is mildly elevated.  Will start remdesivir, Decadron.  Discussed with hospitalist services to evaluate patient in the emergency department for admission.      ____________________________________________   FINAL CLINICAL IMPRESSION(S) / ED DIAGNOSES  Final diagnoses:  Cough  COVID-19  Generalized weakness  Elevated troponin     ED Discharge Orders    None       Note:  This document was prepared using Dragon voice recognition software and may include  unintentional dictation errors.   Irean Hong, MD 06/09/19 [phone]

## 2019-06-08 NOTE — ED Triage Notes (Signed)
Pt arrived with daughter who is staying with pt due to AMS at baseline. Per daughter, who pt lives with, pt has been weak and throughout day, pt has had increased AMS and weakness. Pt lives with son in law who is COVID +. Pt does have cough. Daughter denies fever.

## 2019-06-09 DIAGNOSIS — Z79899 Other long term (current) drug therapy: Secondary | ICD-10-CM | POA: Diagnosis not present

## 2019-06-09 DIAGNOSIS — J1282 Pneumonia due to coronavirus disease 2019: Secondary | ICD-10-CM | POA: Diagnosis present

## 2019-06-09 DIAGNOSIS — E876 Hypokalemia: Secondary | ICD-10-CM | POA: Diagnosis not present

## 2019-06-09 DIAGNOSIS — U071 COVID-19: Principal | ICD-10-CM

## 2019-06-09 DIAGNOSIS — N1831 Chronic kidney disease, stage 3a: Secondary | ICD-10-CM | POA: Diagnosis present

## 2019-06-09 DIAGNOSIS — I10 Essential (primary) hypertension: Secondary | ICD-10-CM | POA: Diagnosis not present

## 2019-06-09 DIAGNOSIS — Z681 Body mass index (BMI) 19 or less, adult: Secondary | ICD-10-CM | POA: Diagnosis not present

## 2019-06-09 DIAGNOSIS — Z96642 Presence of left artificial hip joint: Secondary | ICD-10-CM | POA: Diagnosis present

## 2019-06-09 DIAGNOSIS — K219 Gastro-esophageal reflux disease without esophagitis: Secondary | ICD-10-CM | POA: Diagnosis present

## 2019-06-09 DIAGNOSIS — F0390 Unspecified dementia without behavioral disturbance: Secondary | ICD-10-CM | POA: Diagnosis present

## 2019-06-09 DIAGNOSIS — I1310 Hypertensive heart and chronic kidney disease without heart failure, with stage 1 through stage 4 chronic kidney disease, or unspecified chronic kidney disease: Secondary | ICD-10-CM | POA: Diagnosis present

## 2019-06-09 DIAGNOSIS — G9341 Metabolic encephalopathy: Secondary | ICD-10-CM | POA: Diagnosis present

## 2019-06-09 DIAGNOSIS — Z803 Family history of malignant neoplasm of breast: Secondary | ICD-10-CM | POA: Diagnosis not present

## 2019-06-09 DIAGNOSIS — R0902 Hypoxemia: Secondary | ICD-10-CM | POA: Diagnosis present

## 2019-06-09 DIAGNOSIS — M069 Rheumatoid arthritis, unspecified: Secondary | ICD-10-CM | POA: Diagnosis present

## 2019-06-09 DIAGNOSIS — Z66 Do not resuscitate: Secondary | ICD-10-CM | POA: Diagnosis not present

## 2019-06-09 DIAGNOSIS — E43 Unspecified severe protein-calorie malnutrition: Secondary | ICD-10-CM | POA: Diagnosis present

## 2019-06-09 LAB — CBC
HCT: 34 % — ABNORMAL LOW (ref 36.0–46.0)
Hemoglobin: 11.1 g/dL — ABNORMAL LOW (ref 12.0–15.0)
MCH: 32.6 pg (ref 26.0–34.0)
MCHC: 32.6 g/dL (ref 30.0–36.0)
MCV: 100 fL (ref 80.0–100.0)
Platelets: 173 10*3/uL (ref 150–400)
RBC: 3.4 MIL/uL — ABNORMAL LOW (ref 3.87–5.11)
RDW: 12.6 % (ref 11.5–15.5)
WBC: 7.7 10*3/uL (ref 4.0–10.5)
nRBC: 0 % (ref 0.0–0.2)

## 2019-06-09 LAB — TROPONIN I (HIGH SENSITIVITY)
Troponin I (High Sensitivity): 82 ng/L — ABNORMAL HIGH (ref <18)
Troponin I (High Sensitivity): 86 ng/L — ABNORMAL HIGH (ref <18)

## 2019-06-09 LAB — URINALYSIS, COMPLETE (UACMP) WITH MICROSCOPIC
Bacteria, UA: NONE SEEN
Bilirubin Urine: NEGATIVE
Glucose, UA: NEGATIVE mg/dL
Hgb urine dipstick: NEGATIVE
Ketones, ur: NEGATIVE mg/dL
Leukocytes,Ua: NEGATIVE
Nitrite: NEGATIVE
Protein, ur: NEGATIVE mg/dL
Specific Gravity, Urine: 1.015 (ref 1.005–1.030)
Squamous Epithelial / HPF: NONE SEEN (ref 0–5)
pH: 5 (ref 5.0–8.0)

## 2019-06-09 LAB — COMPREHENSIVE METABOLIC PANEL
ALT: 16 U/L (ref 0–44)
AST: 30 U/L (ref 15–41)
Albumin: 3.8 g/dL (ref 3.5–5.0)
Alkaline Phosphatase: 74 U/L (ref 38–126)
Anion gap: 11 (ref 5–15)
BUN: 19 mg/dL (ref 8–23)
CO2: 24 mmol/L (ref 22–32)
Calcium: 9 mg/dL (ref 8.9–10.3)
Chloride: 99 mmol/L (ref 98–111)
Creatinine, Ser: 0.96 mg/dL (ref 0.44–1.00)
GFR calc Af Amer: >60 mL/min (ref >60)
GFR calc non Af Amer: 53 mL/min — ABNORMAL LOW (ref >60)
Glucose, Bld: 114 mg/dL — ABNORMAL HIGH (ref 70–99)
Potassium: 3.9 mmol/L (ref 3.5–5.1)
Sodium: 134 mmol/L — ABNORMAL LOW (ref 135–145)
Total Bilirubin: 0.7 mg/dL (ref 0.3–1.2)
Total Protein: 6.9 g/dL (ref 6.5–8.1)

## 2019-06-09 LAB — ABO/RH: ABO/RH(D): O POS

## 2019-06-09 LAB — FERRITIN: Ferritin: 80 ng/mL (ref 11–307)

## 2019-06-09 LAB — FIBRINOGEN: Fibrinogen: 321 mg/dL (ref 210–475)

## 2019-06-09 LAB — FIBRIN DERIVATIVES D-DIMER (ARMC ONLY): Fibrin derivatives D-dimer (ARMC): 1957.13 ng/mL (FEU) — ABNORMAL HIGH (ref 0.00–499.00)

## 2019-06-09 LAB — PROCALCITONIN: Procalcitonin: <0.1 ng/mL

## 2019-06-09 LAB — POC SARS CORONAVIRUS 2 AG: SARS Coronavirus 2 Ag: POSITIVE — AB

## 2019-06-09 LAB — C-REACTIVE PROTEIN: CRP: 0.7 mg/dL (ref <1.0)

## 2019-06-09 LAB — LACTATE DEHYDROGENASE: LDH: 134 U/L (ref 98–192)

## 2019-06-09 MED ORDER — ASCORBIC ACID 500 MG PO TABS
500.0000 mg | ORAL_TABLET | Freq: Every day | ORAL | Status: DC
  Administered 2019-06-09 – 2019-06-17 (×9): 500 mg via ORAL
  Filled 2019-06-09 (×9): qty 1

## 2019-06-09 MED ORDER — ONDANSETRON HCL 4 MG/2ML IJ SOLN: 4.0000 mg | Freq: Four times a day (QID) | INTRAMUSCULAR | Status: DC | PRN

## 2019-06-09 MED ORDER — ALBUTEROL SULFATE HFA 108 (90 BASE) MCG/ACT IN AERS
2.0000 | INHALATION_SPRAY | Freq: Four times a day (QID) | RESPIRATORY_TRACT | Status: DC
  Administered 2019-06-09 – 2019-06-18 (×26): 2 via RESPIRATORY_TRACT
  Filled 2019-06-09: qty 6.7

## 2019-06-09 MED ORDER — DEXAMETHASONE SODIUM PHOSPHATE 10 MG/ML IJ SOLN
6.0000 mg | INTRAMUSCULAR | Status: AC
  Administered 2019-06-09 – 2019-06-18 (×10): 6 mg via INTRAVENOUS
  Filled 2019-06-09 (×9): qty 1

## 2019-06-09 MED ORDER — ADULT MULTIVITAMIN W/MINERALS CH
1.0000 | ORAL_TABLET | Freq: Every day | ORAL | Status: DC
  Administered 2019-06-09 – 2019-06-17 (×9): 1 via ORAL
  Filled 2019-06-09 (×9): qty 1

## 2019-06-09 MED ORDER — ZINC SULFATE 220 (50 ZN) MG PO CAPS
220.0000 mg | ORAL_CAPSULE | Freq: Every day | ORAL | Status: DC
  Administered 2019-06-09 – 2019-06-17 (×9): 220 mg via ORAL
  Filled 2019-06-09 (×9): qty 1

## 2019-06-09 MED ORDER — SODIUM CHLORIDE 0.9 % IV SOLN: 100.0000 mg | Freq: Every day | INTRAVENOUS | Status: DC

## 2019-06-09 MED ORDER — SENNOSIDES-DOCUSATE SODIUM 8.6-50 MG PO TABS: 1.0000 | ORAL_TABLET | Freq: Every evening | ORAL | Status: DC | PRN

## 2019-06-09 MED ORDER — MIRTAZAPINE 15 MG PO TABS
15.0000 mg | ORAL_TABLET | Freq: Every day | ORAL | Status: DC
  Administered 2019-06-09 – 2019-06-17 (×9): 15 mg via ORAL
  Filled 2019-06-09 (×9): qty 1

## 2019-06-09 MED ORDER — ONDANSETRON HCL 4 MG PO TABS: 4.0000 mg | ORAL_TABLET | Freq: Four times a day (QID) | ORAL | Status: DC | PRN

## 2019-06-09 MED ORDER — ENOXAPARIN SODIUM 40 MG/0.4ML ~~LOC~~ SOLN
40.0000 mg | SUBCUTANEOUS | Status: DC
  Administered 2019-06-09 (×2): 40 mg via SUBCUTANEOUS
  Filled 2019-06-09 (×2): qty 0.4

## 2019-06-09 MED ORDER — GUAIFENESIN-DM 100-10 MG/5ML PO SYRP
10.0000 mL | ORAL_SOLUTION | ORAL | Status: DC | PRN
  Filled 2019-06-09: qty 10

## 2019-06-09 MED ORDER — SODIUM CHLORIDE 0.9 % IV SOLN: 200.0000 mg | Freq: Once | INTRAVENOUS | Status: DC

## 2019-06-09 MED ORDER — SODIUM CHLORIDE 0.9 % IV SOLN
100.0000 mg | Freq: Every day | INTRAVENOUS | Status: AC
  Administered 2019-06-09 – 2019-06-12 (×4): 100 mg via INTRAVENOUS
  Filled 2019-06-09 (×3): qty 100
  Filled 2019-06-09: qty 20

## 2019-06-09 MED ORDER — SODIUM CHLORIDE 0.9 % IV SOLN
200.0000 mg | Freq: Once | INTRAVENOUS | Status: AC
  Administered 2019-06-09: 02:00:00 200 mg via INTRAVENOUS
  Filled 2019-06-09: qty 200

## 2019-06-09 NOTE — ED Notes (Signed)
Patient assisted by this RN and another RN to toilet, patient did well with 2 assist. Able to urinate in toilet, does not seem to understand concept of purewick. Sats stable, no complaints. Bed alarm set, call bell within reach

## 2019-06-09 NOTE — Progress Notes (Signed)
PROGRESS NOTE    Angel Simmons  GDJ:[id number] DOB: 01-03-1929 DOA: 06/08/2019 PCP: Baxter Hire, MD      Brief Narrative:  Angel Simmons is a 86 y.o. F with dementia who presented with weakness, lethargy and cough.    In the ER, patient had low grade temp, RR 24, CXR clear.  COVID positive.      Assessment & Plan:   Covid pneumonitis with hypoxia Covid-related acute metabolic encephalopathy -Continue remdesivir -Continue dexamethasone -Consult dietitian   Hypertension Slightly high -Hold lisinopril for now  Dementia -Hold memantine -Resume mirtazapine       Disposition: The patient was admitted with COVID-19 and severe encephalopathy.  At baseline she is able to participate in self-cares and make her needs known, however she remains severely encephalopathic, unable to stand.   I will discharge when she has begun to make some improvements in her mentation, and safe disposition has been obtained.        MDM: This is a no charge note.  For further details, please see H&P by my partner Dr. Damita Dunnings from earlier today.  The below labs and imaging reports were reviewed and summarized above.    DVT prophylaxis: Lovenox Code Status: Full code Family Communication: Daughter by phone    Consultants:     Procedures:     Antimicrobials:      Culture data:              Subjective: Patient is severely encephalopathic.        Objective: Vitals:   06/09/19 0330 06/09/19 0400 06/09/19 0552 06/09/19 0827  BP: 134/82 (!) 153/76 (!) 161/76 (!) 151/82  Pulse: 75 81 75 96  Resp: 17 19  18   Temp:   98.3 F (36.8 C) 98.1 F (36.7 C)  TempSrc:   Oral Oral  SpO2: 95% 97% 98% 95%  Weight:      Height:        Intake/Output Summary (Last 24 hours) at 06/09/2019 1629 Last data filed at 06/09/2019 0550 Gross per 24 hour  Intake 0 ml  Output --  Net 0 ml   Filed Weights   06/09/19 0300  Weight: 48.9 kg    Examination: The  patient was seen and examined.      Data Reviewed: I have personally reviewed following labs and imaging studies:  CBC: Recent Labs  Lab 06/08/19 2328 06/09/19 0100  WBC 8.0 7.7  HGB 12.0 11.1*  HCT 36.1 34.0*  MCV 98.6 100.0  PLT 172 622   Basic Metabolic Panel: Recent Labs  Lab 06/08/19 2328  NA 134*  K 3.9  CL 99  CO2 24  GLUCOSE 114*  BUN 19  CREATININE 0.96  CALCIUM 9.0   GFR: Estimated Creatinine Clearance: 31.3 mL/min (by C-G formula based on SCr of 0.96 mg/dL). Liver Function Tests: Recent Labs  Lab 06/08/19 2328  AST 30  ALT 16  ALKPHOS 74  BILITOT 0.7  PROT 6.9  ALBUMIN 3.8   No results for input(s): LIPASE, AMYLASE in the last 168 hours. No results for input(s): AMMONIA in the last 168 hours. Coagulation Profile: No results for input(s): INR, PROTIME in the last 168 hours. Cardiac Enzymes: No results for input(s): CKTOTAL, CKMB, CKMBINDEX, TROPONINI in the last 168 hours. BNP (last 3 results) No results for input(s): PROBNP in the last 8760 hours. HbA1C: No results for input(s): HGBA1C in the last 72 hours. CBG: No results for input(s): GLUCAP in the last 168 hours. Lipid  Profile: No results for input(s): CHOL, HDL, LDLCALC, TRIG, CHOLHDL, LDLDIRECT in the last 72 hours. Thyroid Function Tests: No results for input(s): TSH, T4TOTAL, FREET4, T3FREE, THYROIDAB in the last 72 hours. Anemia Panel: Recent Labs    06/09/19 0100  FERRITIN 80   Urine analysis:    Component Value Date/Time   COLORURINE YELLOW (A) 06/08/2019 2328   APPEARANCEUR CLEAR (A) 06/08/2019 2328   APPEARANCEUR Hazy 01/03/2014 0215   LABSPEC 1.015 06/08/2019 2328   LABSPEC 1.009 01/03/2014 0215   PHURINE 5.0 06/08/2019 2328   GLUCOSEU NEGATIVE 06/08/2019 2328   GLUCOSEU Negative 01/03/2014 0215   HGBUR NEGATIVE 06/08/2019 2328   BILIRUBINUR NEGATIVE 06/08/2019 2328   BILIRUBINUR Negative 01/03/2014 0215   KETONESUR NEGATIVE 06/08/2019 2328   PROTEINUR NEGATIVE  06/08/2019 2328   NITRITE NEGATIVE 06/08/2019 2328   LEUKOCYTESUR NEGATIVE 06/08/2019 2328   LEUKOCYTESUR 3+ 01/03/2014 0215   Sepsis Labs: @LABRCNTIP (procalcitonin:4,lacticacidven:4)  )No results found for this or any previous visit (from the past 240 hour(s)).       Radiology Studies: DG Chest 1 View  Result Date: 06/08/2019 CLINICAL DATA:  Weakness, cough EXAM: CHEST  1 VIEW COMPARISON:  09/12/2017 FINDINGS: Cardiomegaly. Dense mitral valve annular calcifications. No confluent airspace opacities or effusions. No acute bony abnormality. IMPRESSION: Cardiomegaly.  No acute cardiopulmonary disease. Electronically Signed   By: 11/12/2017 M.D.   On: 06/08/2019 23:40        Scheduled Meds: . albuterol  2 puff Inhalation Q6H  . vitamin C  500 mg Oral Daily  . dexamethasone (DECADRON) injection  6 mg Intravenous Q24H  . enoxaparin (LOVENOX) injection  40 mg Subcutaneous Q24H  . multivitamin with minerals  1 tablet Oral Daily  . zinc sulfate  220 mg Oral Daily   Continuous Infusions: . remdesivir 100 mg in NS 100 mL 100 mg (06/09/19 0858)     LOS: 0 days    Time spent: 15 minutes    06/11/19, MD [facility] 06/09/2019, 4:29 PM     Please page though AMION or Epic secure chat:  For password, contact charge nurse

## 2019-06-09 NOTE — ED Notes (Signed)
Bladder scan , patient has purewick in place but placed on bed pan to see if they would help patient urinate. Patient very drowsy, forgetful

## 2019-06-09 NOTE — ED Notes (Signed)
Patient placed in hospital bed, sheets changed. Brief dry, purewick in place. Patient placed in yellow socks and fall bracelet applied. Bed alarm on medium sensitivity. No complaints at present, provided with some sips of water. Daughter is leaving for the night.

## 2019-06-09 NOTE — Progress Notes (Signed)
Initial Nutrition Assessment  DOCUMENTATION CODES:   Underweight  INTERVENTION:   RD will add supplements once diet advanced   NUTRITION DIAGNOSIS:   Increased nutrient needs related to acute illness(COVID 19) as evidenced by increased estimated needs.  GOAL:   Patient will meet greater than or equal to 90% of their needs  MONITOR:   Labs, Diet advancement, Weight trends, Skin, I & O's  REASON FOR ASSESSMENT:   Other (Comment)(Low BMI)    ASSESSMENT:   86 y/o female with h/o dementia, hip fracture, RA who is admitted with COVID 19   Unable to speak with pt r/t dementia. Suspect pt with poor appetite and oral intake at baseline as pt is underweight. Pt is currently NPO. RD will add supplements once diet advanced. Suspect pt is at refeed risk. Per chart, pt appears weight stable pta. Pt is at high risk for malnutrition but unable to diagnose at this time as NFPE cannot be performed.   Medications reviewed and include: ascorbic acid, dexamethasone, lovenox, MVI, zinc  Labs reviewed: Na 134(L)  Unable to complete Nutrition-Focused physical exam at this time as pt with COVID 19.   Diet Order:   Diet Order            Diet NPO time specified  Diet effective now             EDUCATION NEEDS:   Not appropriate for education at this time  Skin:  Skin Assessment: Reviewed RN Assessment  Last BM:  1/26- Type 4  Height:   Ht Readings from Last 1 Encounters:  06/09/19 5' 4.02" (1.626 m)    Weight:   Wt Readings from Last 1 Encounters:  06/09/19 48.9 kg    Ideal Body Weight:  54.5 kg  BMI:  Body mass index is 18.5 kg/m.  Estimated Nutritional Needs:   Kcal:  1300-1500kcal/day  Protein:  65-75g/day  Fluid:  >1.2L/day  Betsey Holiday MS, RD, LDN Pager #- [phone] Office#- [phone] After Hours Pager: [phone]

## 2019-06-09 NOTE — H&P (Signed)
History and Physical    Angel Simmons CBS:[id number] DOB: 02-17-1929 DOA: 06/08/2019  PCP: Gracelyn Nurse, MD   Patient coming from: home I have personally briefly reviewed patient's old medical records in [facility]  Chief Complaint: altered mental status  HPI: Angel Simmons is a 86 y.o. female with medical history significant for dementia, who presents to the emergency room with a 1 day history of weakness, lethargy and cough.  No complaints of fever or apparent shortness of breath or chest pain.  Most of the history is provided by daughter at bedside who states that she was in her usual state of health until earlier in the day.  ED Course: On arrival to the emergency room she had a low-grade temperature of 99, blood pressure 165/76 heart rate 84.  She was tachypneic at 24 with O2 sat 94% on room air.  Chest x-ray showed no acute disease.  Covid test was negative  Review of Systems: Unable to obtain due to lethargy   Past Medical History:  Diagnosis Date  . Chronic back pain   . Hip fx (HCC)   . Hypertension   . Rheumatoid arthritis (HCC)   . Spinal stenosis     Past Surgical History:  Procedure Laterality Date  . CATARACT EXTRACTION, BILATERAL    . CHOLECYSTECTOMY    . HEMORRHOID SURGERY    . HIP ARTHROPLASTY Left 04/05/2015   Procedure: ARTHROPLASTY BIPOLAR HIP (HEMIARTHROPLASTY);  Surgeon: Donato Heinz, MD;  Location: [facility] ORS;  Service: Orthopedics;  Laterality: Left;  . Percutaneous pinning of a right femoral neck fracture       reports that she has never smoked. She has never used smokeless tobacco. She reports that she does not drink alcohol or use drugs.  No Active Allergies  Family History  Problem Relation Age of Onset  . Breast cancer Mother   . Dementia Father      Prior to Admission medications   Medication Sig Start Date End Date Taking? Authorizing Provider  acetaminophen (TYLENOL) 325 MG tablet Take 650 mg by mouth 2 (two) times daily. At  1400 and 2200.   Yes [provider]  calcium carbonate (OSCAL) 1500 (600 Ca) MG TABS tablet Take 1,500 mg by mouth 2 (two) times daily with a meal.   Yes [provider]  cholecalciferol (VITAMIN D) 25 MCG (1000 UNIT) tablet Take 1,000 Units by mouth daily.    Yes [provider]  Docusate Sodium (DSS) 100 MG CAPS Take 200 mg by mouth 2 (two) times daily.  11/15/15  Yes [provider]  DULoxetine (CYMBALTA) 30 MG capsule Take 30 mg by mouth daily.   Yes [provider]  gabapentin (NEURONTIN) 300 MG capsule TK ONE C PO BID 11/20/17  Yes [provider]  lisinopril (ZESTRIL) 30 MG tablet Take 30 mg by mouth daily.  08/30/17  Yes [provider]  memantine (NAMENDA) 10 MG tablet Take 10 mg by mouth 2 (two) times daily.    Yes [provider]  mirtazapine (REMERON) 7.5 MG tablet Take 7.5 mg by mouth at bedtime.    Yes [provider]  Multiple Vitamin (MULTIVITAMIN) capsule Take 1 capsule by mouth daily.   Yes [provider]  Multiple Vitamins-Minerals (PRESERVISION AREDS 2+MULTI VIT PO) Take 1 tablet by mouth 2 (two) times daily.    Yes [provider]  pantoprazole (PROTONIX) 40 MG tablet Take 40 mg by mouth daily.   Yes [provider]  vitamin B-12 (CYANOCOBALAMIN) 1000 MCG tablet Take 1,000 mcg by mouth daily.   Yes [provider]  vitamin C (ASCORBIC ACID) 500 MG tablet Take 1,000 mg by mouth daily.    Yes [provider]  Zinc 50 MG CAPS Take 50 mg by mouth daily.   Yes [provider]  lidocaine (LIDODERM) 5 % Place 1 patch onto the skin daily. Remove & Discard patch within 12 hours or as directed by MD Patient not taking: Reported on 06/09/2019 09/14/17   Enedina Finner, MD  oxyCODONE (OXY IR/ROXICODONE) 5 MG immediate release tablet Take 1 tablet (5 mg total) by mouth every 6 (six) hours as needed for breakthrough pain. Patient taking differently: Take 5 mg by  mouth 2 (two) times daily. Every morning and at bedtime. 09/13/17   Enedina Finner, MD  predniSONE (DELTASONE) 5 MG tablet Take as directed - 6 day taper 08/16/17   [provider]  traZODone (DESYREL) 50 MG tablet Take 0.5 tablets (25 mg total) by mouth at bedtime as needed for sleep. Patient not taking: Reported on 06/09/2019 09/13/17   Enedina Finner, MD    Physical Exam: Vitals:   06/09/19 0030 06/09/19 0100 06/09/19 0130 06/09/19 0300  BP: 137/64 126/61 (!) 117/58 (!) 144/58  Pulse: 78 73 66 (!) 59  Resp: 13 (!) 21 (!) 21 17  Temp:      TempSrc:      SpO2: 97% 96% 95% 98%  Weight:    48.9 kg  Height:    5' 4.02" (1.626 m)     Vitals:   06/09/19 0030 06/09/19 0100 06/09/19 0130 06/09/19 0300  BP: 137/64 126/61 (!) 117/58 (!) 144/58  Pulse: 78 73 66 (!) 59  Resp: 13 (!) 21 (!) 21 17  Temp:      TempSrc:      SpO2: 97% 96% 95% 98%  Weight:    48.9 kg  Height:    5' 4.02" (1.626 m)    Constitutional: NAD, lethargic but arousable  eyes: PERRL, lids and conjunctivae normal ENMT: Mucous membranes are moist.  Neck: normal, supple, no masses, no thyromegaly Respiratory: clear to auscultation bilaterally, no wheezing, no crackles. Normal respiratory effort. No accessory muscle use.  Cardiovascular: Regular rate and rhythm, no murmurs / rubs / gallops. No extremity edema. 2+ pedal pulses. No carotid bruits.  Abdomen: no tenderness, no masses palpated. No hepatosplenomegaly. Bowel sounds positive.  Musculoskeletal: no clubbing / cyanosis. No joint deformity upper and lower extremities.  Skin: no rashes, lesions, ulcers.  Neurologic: No gross focal neurologic deficit. Psychiatric: Unable to assess due to lethargy   Labs on Admission: I have personally reviewed following labs and imaging studies  CBC: Recent Labs  Lab 06/08/19 2328  WBC 8.0  HGB 12.0  HCT 36.1  MCV 98.6  PLT 172   Basic Metabolic Panel: Recent Labs  Lab 06/08/19 2328  NA 134*  K 3.9  CL 99  CO2 24    GLUCOSE 114*  BUN 19  CREATININE 0.96  CALCIUM 9.0   GFR: Estimated Creatinine Clearance: 31.3 mL/min (by C-G formula based on SCr of 0.96 mg/dL). Liver Function Tests: Recent Labs  Lab 06/08/19 2328  AST 30  ALT 16  ALKPHOS 74  BILITOT 0.7  PROT 6.9  ALBUMIN 3.8   No results for input(s): LIPASE, AMYLASE in the last 168 hours. No results for input(s): AMMONIA in the last 168 hours. Coagulation Profile: No results for input(s): INR, PROTIME  in the last 168 hours. Cardiac Enzymes: No results for input(s): CKTOTAL, CKMB, CKMBINDEX, TROPONINI in the last 168 hours. BNP (last 3 results) No results for input(s): PROBNP in the last 8760 hours. HbA1C: No results for input(s): HGBA1C in the last 72 hours. CBG: No results for input(s): GLUCAP in the last 168 hours. Lipid Profile: No results for input(s): CHOL, HDL, LDLCALC, TRIG, CHOLHDL, LDLDIRECT in the last 72 hours. Thyroid Function Tests: No results for input(s): TSH, T4TOTAL, FREET4, T3FREE, THYROIDAB in the last 72 hours. Anemia Panel: Recent Labs    06/09/19 0100  FERRITIN 80   Urine analysis:    Component Value Date/Time   COLORURINE YELLOW (A) 09/12/2017 0035   APPEARANCEUR HAZY (A) 09/12/2017 0035   APPEARANCEUR Hazy 01/03/2014 0215   LABSPEC 1.004 (L) 09/12/2017 0035   LABSPEC 1.009 01/03/2014 0215   PHURINE 8.0 09/12/2017 0035   GLUCOSEU NEGATIVE 09/12/2017 0035   GLUCOSEU Negative 01/03/2014 0215   HGBUR NEGATIVE 09/12/2017 0035   BILIRUBINUR NEGATIVE 09/12/2017 0035   BILIRUBINUR Negative 01/03/2014 0215   KETONESUR NEGATIVE 09/12/2017 0035   PROTEINUR NEGATIVE 09/12/2017 0035   NITRITE POSITIVE (A) 09/12/2017 0035   LEUKOCYTESUR LARGE (A) 09/12/2017 0035   LEUKOCYTESUR 3+ 01/03/2014 0215    Radiological Exams on Admission: DG Chest 1 View  Result Date: 06/08/2019 CLINICAL DATA:  Weakness, cough EXAM: CHEST  1 VIEW COMPARISON:  09/12/2017 FINDINGS: Cardiomegaly. Dense mitral valve annular  calcifications. No confluent airspace opacities or effusions. No acute bony abnormality. IMPRESSION: Cardiomegaly.  No acute cardiopulmonary disease. Electronically Signed   By: Rolm Baptise M.D.   On: 06/08/2019 23:40    EKG: Independently reviewed.   Assessment/Plan Active Problems:   Acute metabolic encephalopathy   COVID-19 virus infection Mild hypoxia  -Patient has a cough and lethargy with no pneumonia on chest x-ray -O2 sat was 94% on room air -IV remdesivir, IV dexamethasone, albuterol, vitamin -O2 to keep sats over 92% -Keep n.p.o. until more alert -Gentle IV hydration given -Fall and aspiration precaution    HTN (hypertension) Continue home meds    Dementia without behavioral disturbance ([place]) Continue home meds      DVT prophylaxis: lovenox  Code Status: full code, per daughter at bedside  Family Communication: daughter, June Graham  Disposition Plan: Back to previous home environment Consults called: none     Athena Masse MD [facility]     06/09/2019, 3:26 AM

## 2019-06-09 NOTE — ED Notes (Signed)
Patient provided with warm blanket. Daughter remains at bedside

## 2019-06-10 DIAGNOSIS — F0390 Unspecified dementia without behavioral disturbance: Secondary | ICD-10-CM

## 2019-06-10 DIAGNOSIS — I10 Essential (primary) hypertension: Secondary | ICD-10-CM

## 2019-06-10 LAB — CBC WITH DIFFERENTIAL/PLATELET
Abs Immature Granulocytes: 0.04 10*3/uL (ref 0.00–0.07)
Basophils Absolute: 0 10*3/uL (ref 0.0–0.1)
Basophils Relative: 0 %
Eosinophils Absolute: 0 10*3/uL (ref 0.0–0.5)
Eosinophils Relative: 0 %
HCT: 41.3 % (ref 36.0–46.0)
Hemoglobin: 13.4 g/dL (ref 12.0–15.0)
Immature Granulocytes: 0 %
Lymphocytes Relative: 8 %
Lymphs Abs: 0.9 10*3/uL (ref 0.7–4.0)
MCH: 31.9 pg (ref 26.0–34.0)
MCHC: 32.4 g/dL (ref 30.0–36.0)
MCV: 98.3 fL (ref 80.0–100.0)
Monocytes Absolute: 0.7 10*3/uL (ref 0.1–1.0)
Monocytes Relative: 6 %
Neutro Abs: 10 10*3/uL — ABNORMAL HIGH (ref 1.7–7.7)
Neutrophils Relative %: 86 %
Platelets: 200 10*3/uL (ref 150–400)
RBC: 4.2 MIL/uL (ref 3.87–5.11)
RDW: 12.2 % (ref 11.5–15.5)
WBC: 11.7 10*3/uL — ABNORMAL HIGH (ref 4.0–10.5)
nRBC: 0 % (ref 0.0–0.2)

## 2019-06-10 LAB — COMPREHENSIVE METABOLIC PANEL
ALT: 57 U/L — ABNORMAL HIGH (ref 0–44)
AST: 57 U/L — ABNORMAL HIGH (ref 15–41)
Albumin: 3.9 g/dL (ref 3.5–5.0)
Alkaline Phosphatase: 54 U/L (ref 38–126)
Anion gap: 14 (ref 5–15)
BUN: 21 mg/dL (ref 8–23)
CO2: 26 mmol/L (ref 22–32)
Calcium: 9.2 mg/dL (ref 8.9–10.3)
Chloride: 96 mmol/L — ABNORMAL LOW (ref 98–111)
Creatinine, Ser: 1.04 mg/dL — ABNORMAL HIGH (ref 0.44–1.00)
GFR calc Af Amer: 56 mL/min — ABNORMAL LOW (ref >60)
GFR calc non Af Amer: 48 mL/min — ABNORMAL LOW (ref >60)
Glucose, Bld: 148 mg/dL — ABNORMAL HIGH (ref 70–99)
Potassium: 2.9 mmol/L — ABNORMAL LOW (ref 3.5–5.1)
Sodium: 136 mmol/L (ref 135–145)
Total Bilirubin: 0.6 mg/dL (ref 0.3–1.2)
Total Protein: 7.2 g/dL (ref 6.5–8.1)

## 2019-06-10 LAB — FIBRIN DERIVATIVES D-DIMER (ARMC ONLY): Fibrin derivatives D-dimer (ARMC): 1688.12 ng/mL (FEU) — ABNORMAL HIGH (ref 0.00–499.00)

## 2019-06-10 LAB — MAGNESIUM: Magnesium: 1.9 mg/dL (ref 1.7–2.4)

## 2019-06-10 LAB — C-REACTIVE PROTEIN: CRP: 3.7 mg/dL — ABNORMAL HIGH (ref <1.0)

## 2019-06-10 MED ORDER — ENSURE ENLIVE PO LIQD
237.0000 mL | Freq: Two times a day (BID) | ORAL | Status: DC
  Administered 2019-06-10 – 2019-06-16 (×12): 237 mL via ORAL

## 2019-06-10 MED ORDER — ENOXAPARIN SODIUM 30 MG/0.3ML ~~LOC~~ SOLN
30.0000 mg | SUBCUTANEOUS | Status: DC
  Administered 2019-06-11: 01:00:00 30 mg via SUBCUTANEOUS
  Filled 2019-06-10: qty 0.3

## 2019-06-10 MED ORDER — ORAL CARE MOUTH RINSE
15.0000 mL | Freq: Two times a day (BID) | OROMUCOSAL | Status: DC
  Administered 2019-06-10 – 2019-06-17 (×16): 15 mL via OROMUCOSAL

## 2019-06-10 MED ORDER — POTASSIUM CHLORIDE 10 MEQ/100ML IV SOLN
10.0000 meq | INTRAVENOUS | Status: AC
  Administered 2019-06-10 (×6): 10 meq via INTRAVENOUS
  Filled 2019-06-10 (×6): qty 100

## 2019-06-10 NOTE — Progress Notes (Signed)
PROGRESS NOTE    Angel Simmons  ZOX:[id number] DOB: June 05, 1928 DOA: 06/08/2019 PCP: Gracelyn Nurse, MD      Brief Narrative:  Angel Simmons is a 86 y.o. F with dementia who presented with weakness, lethargy and cough.    In the ER, patient had low grade temp, RR 24, CXR clear.  COVID positive.        Assessment & Plan:   Covid pneumonitis with hypoxia Covid-related acute metabolic encephalopathy Her encephalopathy persists severely.  She has been weaned off O2 and O2 sats maintain well on room air although she is tachypneic.  Afebrile, inflammatory markers low but trending up  -Continue remdesivir, day 3 of 5 -Continue dexamethasone, day 3 -Consult dietitian -Prompt patient to drink fluids/Ensure every 3 hours -Cautious with fluid balance, strict intake and output   Hypertension Blood pressure labile -Hold lisinopril for now  Hypokalemia Magnesium replete. -Supplement potassium  Dementia  At baseline patient lives with daughter, is able to make needs known, interactive. -Hold memantine -Continue mirtazapine  CKD IIIa Baseline Cr ~0.9-1, stable relative to baseline  At risk for malnutrition BMI 18 at baseline -Consult dietitian, start Ensure          Disposition: The patient was admitted with COVID-19 and severe encephalopathy.  At baseline she is able to participate in self-cares and make her needs known, however at present she remains severely altered, unable to communicate, unable to stand or participate in self-cares.    I will continue IV remdesivir, IV electrolytes, and IV fluids as needed, and will consider discharge when she has begun to make some improvements in her mentation, and safe disposition has been obtained, or goals of care have shifted to comfort, as her prognosis at this age with severe Covid encephalopathy is concerning.        MDM: The below labs and imaging reports reviewed and summarized above.  Medication management  as above.  The patient is a severe neurological change, and severe ongoing Covid encephalopathy.     DVT prophylaxis: Lovenox Code Status: Full code Family Communication: Daughter by phone    Consultants:     Procedures:     Antimicrobials:   Only remdesivir  Culture data:              Subjective: The patient is remained severely encephalopathic overnight.  Her sheets were smeared with feces this morning, quickly cleaned up, and when I enter, she is restless and appears attempting to get out of bed.  She has had no fever.  She is eating hardly anything.        Objective: Vitals:   06/09/19 1751 06/09/19 2141 06/10/19 0349 06/10/19 0819  BP: (!) 161/67 (!) 153/86 139/85 (!) 167/92  Pulse: 90 100 85 99  Resp:  20 20 19   Temp: 98.3 F (36.8 C) 99 F (37.2 C) 98.2 F (36.8 C) 98.2 F (36.8 C)  TempSrc: Oral Oral Oral Oral  SpO2: 96% (!) 87% 93% 96%  Weight:      Height:       No intake or output data in the 24 hours ending 06/10/19 1047 Filed Weights   06/09/19 0300  Weight: 48.9 kg    Examination: General appearance: Frail elderly adult female, awake but inattentive, tangled in sheets, appears to be less likely trying to get out of bed.  Her breakfast is in front of her, untouched. HEENT: Anicteric, conjunctiva pink, lids and lashes normal. No nasal deformity, discharge, epistaxis.  Lips dry,  oropharynx tacky dry although she does not open her mouth for examination.  Hearing unable to assess.  Skin: Warm and dry.  No suspicious rashes or lesions. Cardiac: Tachycardic, regular, no murmurs appreciated.  No LE edema.    Respiratory: Tachypneic without accessory muscle use.  CTAB without rales or wheezes. Abdomen: Abdomen soft.  No grimace to palpation. No ascites, distension, hepatosplenomegaly.   MSK: No deformities or effusions of the large joints of the upper or lower extremities bilaterally.  Diffuse severe loss of subcutaneous muscle mass and  fat, expected for age and degree of dementia. Neuro: Awake but severely inattentive, listless.  Responds with incoherent mumbling when asked her name.  Stares blankly when asked any other questions.  Does not follow commands.  Appears to have symmetric movement with severe generalized weakness in all 4 extremities.  Psych: Confused.  Inattentive.  Judgment appears severely impaired.      Data Reviewed: I have personally reviewed following labs and imaging studies:  CBC: Recent Labs  Lab 06/08/19 2328 06/09/19 0100 06/10/19 0412  WBC 8.0 7.7 11.7*  NEUTROABS  --   --  10.0*  HGB 12.0 11.1* 13.4  HCT 36.1 34.0* 41.3  MCV 98.6 100.0 98.3  PLT 172 173 073   Basic Metabolic Panel: Recent Labs  Lab 06/08/19 2328 06/10/19 0412  NA 134* 136  K 3.9 2.9*  CL 99 96*  CO2 24 26  GLUCOSE 114* 148*  BUN 19 21  CREATININE 0.96 1.04*  CALCIUM 9.0 9.2  MG  --  1.9   GFR: Estimated Creatinine Clearance: 28.9 mL/min (A) (by C-G formula based on SCr of 1.04 mg/dL (H)). Liver Function Tests: Recent Labs  Lab 06/08/19 2328 06/10/19 0412  AST 30 57*  ALT 16 57*  ALKPHOS 74 54  BILITOT 0.7 0.6  PROT 6.9 7.2  ALBUMIN 3.8 3.9   No results for input(s): LIPASE, AMYLASE in the last 168 hours. No results for input(s): AMMONIA in the last 168 hours. Coagulation Profile: No results for input(s): INR, PROTIME in the last 168 hours. Cardiac Enzymes: No results for input(s): CKTOTAL, CKMB, CKMBINDEX, TROPONINI in the last 168 hours. BNP (last 3 results) No results for input(s): PROBNP in the last 8760 hours. HbA1C: No results for input(s): HGBA1C in the last 72 hours. CBG: No results for input(s): GLUCAP in the last 168 hours. Lipid Profile: No results for input(s): CHOL, HDL, LDLCALC, TRIG, CHOLHDL, LDLDIRECT in the last 72 hours. Thyroid Function Tests: No results for input(s): TSH, T4TOTAL, FREET4, T3FREE, THYROIDAB in the last 72 hours. Anemia Panel: Recent Labs     06/09/19 0100  FERRITIN 80   Urine analysis:    Component Value Date/Time   COLORURINE YELLOW (A) 06/08/2019 2328   APPEARANCEUR CLEAR (A) 06/08/2019 2328   APPEARANCEUR Hazy 01/03/2014 0215   LABSPEC 1.015 06/08/2019 2328   LABSPEC 1.009 01/03/2014 0215   PHURINE 5.0 06/08/2019 2328   GLUCOSEU NEGATIVE 06/08/2019 2328   GLUCOSEU Negative 01/03/2014 0215   HGBUR NEGATIVE 06/08/2019 2328   BILIRUBINUR NEGATIVE 06/08/2019 2328   BILIRUBINUR Negative 01/03/2014 0215   KETONESUR NEGATIVE 06/08/2019 2328   PROTEINUR NEGATIVE 06/08/2019 2328   NITRITE NEGATIVE 06/08/2019 2328   LEUKOCYTESUR NEGATIVE 06/08/2019 2328   LEUKOCYTESUR 3+ 01/03/2014 0215   Sepsis Labs: @LABRCNTIP (procalcitonin:4,lacticacidven:4)  )No results found for this or any previous visit (from the past 240 hour(s)).       Radiology Studies: DG Chest 1 View  Result Date: 06/08/2019 CLINICAL DATA:  Weakness, cough EXAM: CHEST  1 VIEW COMPARISON:  09/12/2017 FINDINGS: Cardiomegaly. Dense mitral valve annular calcifications. No confluent airspace opacities or effusions. No acute bony abnormality. IMPRESSION: Cardiomegaly.  No acute cardiopulmonary disease. Electronically Signed   By: Charlett Nose M.D.   On: 06/08/2019 23:40        Scheduled Meds: . albuterol  2 puff Inhalation Q6H  . vitamin C  500 mg Oral Daily  . dexamethasone (DECADRON) injection  6 mg Intravenous Q24H  . enoxaparin (LOVENOX) injection  40 mg Subcutaneous Q24H  . mouth rinse  15 mL Mouth Rinse BID  . mirtazapine  15 mg Oral QHS  . multivitamin with minerals  1 tablet Oral Daily  . zinc sulfate  220 mg Oral Daily   Continuous Infusions: . potassium chloride 10 mEq (06/10/19 0944)  . remdesivir 100 mg in NS 100 mL Stopped (06/09/19 0929)     LOS: 1 day    Time spent: 35 minutes    Alberteen Sam, MD [facility] 06/10/2019, 10:47 AM     Please page though AMION or Epic secure chat:  For password, contact  charge nurse

## 2019-06-10 NOTE — Evaluation (Signed)
Physical Therapy Evaluation Patient Details Name: Angel Simmons MRN: [id number] DOB: [birth year] Today's Date: 06/10/2019   History of Present Illness  presented to ER secondary to progressive weakness, cough and lethargy; admitted for management of acute metabolic encephalopathy, [id number] pneumonitis.  Clinical Impression  Patient in bed upon arrival to room.  Visually acknowledges and tracks therapist during session, but with minimal verbal interaction.  Speech, when attempted, generally garbled and unintelligible.  Patient globally weak and deconditioned throughout all extremities; noted ankle DF restrictions to L ankle.  Limited ability to follow verbal commands, requiring hand-over-hand assist to initiate and guide all movement throughout session.  Currently requiring mod assist for bed mobility; cga/min assist for unsupported sitting edge of bed; mod assist for sit/stand with L HHA.  Very weak and unsteady in standing, constantly reaching for furniture/external environment for additional stabilization/support.  Unsafe/unable to attempt gait or OOB to chair at this time.  Will continue to assess/progress as appropriate. Would benefit from skilled PT to address above deficits and promote optimal return to PLOF.; recommend transition to STR upon discharge from acute hospitalization.     Follow Up Recommendations SNF    Equipment Recommendations       Recommendations for Other Services       Precautions / Restrictions Precautions Precautions: Fall Restrictions Weight Bearing Restrictions: No      Mobility  Bed Mobility Overal bed mobility: Needs Assistance Bed Mobility: Supine to Sit;Sit to Supine;Rolling Rolling: Mod assist   Supine to sit: Mod assist Sit to supine: Mod assist   General bed mobility comments: hand-over-hand to initiate/guide functional movement  Transfers Overall transfer level: Needs assistance Equipment used: 1 person hand held assist Transfers: Sit  to/from Stand Sit to Stand: Mod assist         General transfer comment: reaching for IV pole, bedrail for external support; globally weak and unsteady; unsafe to attempt without extensive +1.  will plan to trial RW next session.  Ambulation/Gait             General Gait Details: unsafe/unable  Stairs            Wheelchair Mobility    Modified Rankin (Stroke Patients Only)       Balance Overall balance assessment: Needs assistance Sitting-balance support: No upper extremity supported;Feet supported Sitting balance-Leahy Scale: Fair   Postural control: Posterior lean Standing balance support: Bilateral upper extremity supported Standing balance-Leahy Scale: Zero Standing balance comment: requires +1 mod assist and bilat UE support to maintain balance; very forward flexed posture                             Pertinent Vitals/Pain Pain Assessment: Faces Faces Pain Scale: No hurt    Home Living                   Additional Comments: Patient poor historian; unable to provide accurate history    Prior Function           Comments: Patient poor historian; unable to provide accurate history.  Per previous notes (>1 year ago), was ambulatory with SPC/rollator as needed. Will verify with family as available.     Hand Dominance        Extremity/Trunk Assessment   Upper Extremity Assessment Upper Extremity Assessment: Generalized weakness    Lower Extremity Assessment Lower Extremity Assessment: Generalized weakness(grossly at least 4-/5; L ankle lacking 10-15 degrees passive DF)  Communication   Communication: (minimally verbal; speech largely garbled and unintelligible)  Cognition Arousal/Alertness: Lethargic Behavior During Therapy: Flat affect Overall Cognitive Status: Difficult to assess                                 General Comments: minimally responsive/interactive with therapist; unable to  process/follow verbal commands, but does respond well to hand-over-hand facilitation      General Comments      Exercises Other Exercises Other Exercises: Rolling bilat, mod assist with hand-over-hand for UE placement, initiation of rotation; dep assist for hygiene after incontinent stool   Assessment/Plan    PT Assessment Patient needs continued PT services  PT Problem List Decreased strength;Decreased range of motion;Decreased knowledge of use of DME;Decreased balance;Decreased activity tolerance;Decreased mobility;Decreased coordination;Decreased cognition;Decreased safety awareness;Decreased knowledge of precautions       PT Treatment Interventions DME instruction;Gait training;Functional mobility training;Therapeutic activities;Therapeutic exercise;Balance training;Patient/family education    PT Goals (Current goals can be found in the Care Plan section)  Acute Rehab PT Goals PT Goal Formulation: Patient unable to participate in goal setting Time For Goal Achievement: 06/24/19 Potential to Achieve Goals: Fair    Frequency Min 2X/week   Barriers to discharge Decreased caregiver support      Co-evaluation               AM-PAC PT "6 Clicks" Mobility  Outcome Measure Help needed turning from your back to your side while in a flat bed without using bedrails?: A Little Help needed moving from lying on your back to sitting on the side of a flat bed without using bedrails?: A Lot Help needed moving to and from a bed to a chair (including a wheelchair)?: A Lot Help needed standing up from a chair using your arms (e.g., wheelchair or bedside chair)?: A Lot Help needed to walk in hospital room?: Total Help needed climbing 3-5 steps with a railing? : Total 6 Click Score: 11    End of Session   Activity Tolerance: Patient limited by fatigue Patient left: in bed;with call bell/phone within reach;with bed alarm set Nurse Communication: Mobility status PT Visit Diagnosis:  Difficulty in walking, not elsewhere classified (R26.2);Muscle weakness (generalized) (M62.81)    Time: 9470-9628 PT Time Calculation (min) (ACUTE ONLY): 19 min   Charges:   PT Evaluation $PT Eval Moderate Complexity: 1 Mod PT Treatments $Therapeutic Activity: 8-22 mins        Omah Dewalt H. Manson Passey, PT, DPT, NCS 06/10/19, 2:52 PM [phone]

## 2019-06-10 NOTE — Progress Notes (Signed)
Called and left message for June, patient's daughter.  Gave update on secure cell phone.  Let daughter know to call back for any other questions.  Will pass onto night shift that a video call is a good idea so that the patient's condition is accurately conveyed to family.

## 2019-06-10 NOTE — Progress Notes (Signed)
Spoke with the patient's brother, Wandra Feinstein and gave update on patient.  Also spoke with June, the patient's daughter and gave update on patient.

## 2019-06-10 NOTE — Progress Notes (Signed)
Nutrition Follow-up  RD working remotely.  DOCUMENTATION CODES:   Underweight  INTERVENTION:  Continue Ensure Enlive po BID, each supplement provides 350 kcal and 20 grams of protein.  Provide Magic cup TID with meals, each supplement provides 290 kcal and 9 grams of protein.  NUTRITION DIAGNOSIS:   Increased nutrient needs related to acute illness(COVID 19) as evidenced by estimated needs.  Ongoing.  GOAL:   Patient will meet greater than or equal to 90% of their needs  Progressing.  MONITOR:   Labs, Diet advancement, Weight trends, Skin, I & O's  REASON FOR ASSESSMENT:   Consult Assessment of nutrition requirement/status  ASSESSMENT:   86 y/o female with h/o dementia, hip fracture, RA who is admitted with COVID 19  Diet was advanced from NPO to regular on 1/26. RD received consult for assessment of nutrition requirements/status. Patient remains encephalopathic and is unable to provide any history over the phone. She has already been ordered for Ensure Enlive po BID. RD suspects patient is malnourished but unable to confirm without completing NFPE.  Medications reviewed and include: vitamin C 500 mg daily, Decadron 6 mg Q24hrs IV, Remeron 15 mg QHS, MVI daily, zinc sulfate 220 mg daily, remdesivir.  Labs reviewed: Potassium 2.9, Chloride 96, Creatinine 1.04.  Diet Order:   Diet Order            Diet regular Room service appropriate? Yes; Fluid consistency: Thin  Diet effective now             EDUCATION NEEDS:   Not appropriate for education at this time  Skin:  Skin Assessment: Reviewed RN Assessment  Last BM:  06/10/2019 small type 6  Height:   Ht Readings from Last 1 Encounters:  06/09/19 5' 4.02" (1.626 m)   Weight:   Wt Readings from Last 1 Encounters:  06/09/19 48.9 kg   Ideal Body Weight:  54.5 kg  BMI:  Body mass index is 18.5 kg/m.  Estimated Nutritional Needs:   Kcal:  1300-1500kcal/day  Protein:  65-75g/day  Fluid:   >1.2L/day  Felix Pacini, MS, RD, LDN Office: [phone] Pager: [phone] After Hours/Weekend Pager: [phone]

## 2019-06-10 NOTE — Progress Notes (Signed)
AM serum K noted to be 2.9. E. Ouma, APP on call was notified.

## 2019-06-10 NOTE — Progress Notes (Signed)
PHARMACIST - PHYSICIAN COMMUNICATION  CONCERNING:  Enoxaparin (Lovenox) for DVT Prophylaxis    RECOMMENDATION: Patient was prescribed enoxaparin 40 mg q24 hours for VTE prophylaxis.   Filed Weights   06/09/19 0300  Weight: 107 lb 12.9 oz (48.9 kg)    Body mass index is 18.5 kg/m.  Estimated Creatinine Clearance: 28.9 mL/min (A) (by C-G formula based on SCr of 1.04 mg/dL (H)).   Based on [facility] policy patient is candidate for enoxaparin 30mg  every 24 hours based on CrCl <64ml/min   DESCRIPTION: Pharmacy has adjusted enoxaparin dose per [facility] policy.  Patient is now receiving enoxaparin 30mg  every 24 hours.  [facility], PharmD Clinical Pharmacist  06/10/2019 1:37 PM

## 2019-06-11 LAB — CBC WITH DIFFERENTIAL/PLATELET
Abs Immature Granulocytes: 0.02 10*3/uL (ref 0.00–0.07)
Basophils Absolute: 0 10*3/uL (ref 0.0–0.1)
Basophils Relative: 0 %
Eosinophils Absolute: 0 10*3/uL (ref 0.0–0.5)
Eosinophils Relative: 0 %
HCT: 37.4 % (ref 36.0–46.0)
Hemoglobin: 12.5 g/dL (ref 12.0–15.0)
Immature Granulocytes: 0 %
Lymphocytes Relative: 8 %
Lymphs Abs: 0.7 10*3/uL (ref 0.7–4.0)
MCH: 32.3 pg (ref 26.0–34.0)
MCHC: 33.4 g/dL (ref 30.0–36.0)
MCV: 96.6 fL (ref 80.0–100.0)
Monocytes Absolute: 0.7 10*3/uL (ref 0.1–1.0)
Monocytes Relative: 8 %
Neutro Abs: 7.4 10*3/uL (ref 1.7–7.7)
Neutrophils Relative %: 84 %
Platelets: 183 10*3/uL (ref 150–400)
RBC: 3.87 MIL/uL (ref 3.87–5.11)
RDW: 12.3 % (ref 11.5–15.5)
WBC: 8.9 10*3/uL (ref 4.0–10.5)
nRBC: 0 % (ref 0.0–0.2)

## 2019-06-11 LAB — COMPREHENSIVE METABOLIC PANEL
ALT: 26 U/L (ref 0–44)
AST: 65 U/L — ABNORMAL HIGH (ref 15–41)
Albumin: 3.5 g/dL (ref 3.5–5.0)
Alkaline Phosphatase: 42 U/L (ref 38–126)
Anion gap: 14 (ref 5–15)
BUN: 31 mg/dL — ABNORMAL HIGH (ref 8–23)
CO2: 25 mmol/L (ref 22–32)
Calcium: 9.3 mg/dL (ref 8.9–10.3)
Chloride: 97 mmol/L — ABNORMAL LOW (ref 98–111)
Creatinine, Ser: 0.92 mg/dL (ref 0.44–1.00)
GFR calc Af Amer: >60 mL/min (ref >60)
GFR calc non Af Amer: 56 mL/min — ABNORMAL LOW (ref >60)
Glucose, Bld: 148 mg/dL — ABNORMAL HIGH (ref 70–99)
Potassium: 3.9 mmol/L (ref 3.5–5.1)
Sodium: 136 mmol/L (ref 135–145)
Total Bilirubin: 0.7 mg/dL (ref 0.3–1.2)
Total Protein: 6.6 g/dL (ref 6.5–8.1)

## 2019-06-11 LAB — C-REACTIVE PROTEIN: CRP: 10.4 mg/dL — ABNORMAL HIGH (ref <1.0)

## 2019-06-11 LAB — FIBRIN DERIVATIVES D-DIMER (ARMC ONLY): Fibrin derivatives D-dimer (ARMC): 1542.34 ng/mL (FEU) — ABNORMAL HIGH (ref 0.00–499.00)

## 2019-06-11 MED ORDER — ENOXAPARIN SODIUM 40 MG/0.4ML ~~LOC~~ SOLN
40.0000 mg | SUBCUTANEOUS | Status: DC
  Administered 2019-06-11 – 2019-06-17 (×7): 40 mg via SUBCUTANEOUS
  Filled 2019-06-11 (×7): qty 0.4

## 2019-06-11 MED ORDER — POTASSIUM CHLORIDE IN NACL 20-0.9 MEQ/L-% IV SOLN
INTRAVENOUS | Status: AC
  Filled 2019-06-11: qty 1000

## 2019-06-11 NOTE — Progress Notes (Signed)
Called patients daughter June with an update. Used ipad to facetime with patients daughter, June and husband.

## 2019-06-11 NOTE — Progress Notes (Signed)
PHARMACY NOTE:  ANTICOAGULATION RENAL DOSAGE ADJUSTMENT  Current anticoagulation regimen includes a mismatch between anticoagulation dosage and estimated renal function.  As per policy approved by the Pharmacy & Therapeutics and Medical Executive Committees, the anticoagulation dosage will be adjusted accordingly.  Current anticoagulation dosage:  lovenox 30 mg subq daily  Indication: VTE prophylaxis  Renal Function:  Estimated Creatinine Clearance: 32.6 mL/min (by C-G formula based on SCr of 0.92 mg/dL). []      On intermittent HD, scheduled: []      On CRRT    Anticoagulation dosage has been changed to:  lovenox 40 mg subq daily   Additional comments: patient's renal function has improved to CrCl > 30 ml/min.  Thank you for allowing pharmacy to be a part of this patient's care.  , PharmD, BCPS Clinical Pharmacist 06/11/2019 5:37 AM

## 2019-06-11 NOTE — Progress Notes (Signed)
PROGRESS NOTE    Angel Simmons  [id number]:[id number] DOB: 08/10/1928 DOA: 06/08/2019 PCP: Baxter Hire, MD      Brief Narrative:  Angel Simmons is a 86 y.o. F with dementia who presented with weakness, lethargy and cough.    In the ER, patient had low grade temp, RR 24, CXR clear.  COVID positive.           Assessment & Plan:  Covid pneumonitis with hypoxia Covid-related acute metabolic encephalopathy She remains severely encephalopathic.  Refusing food.  Minimal fluid intake.    O2 sats normal on room air.  BUN to Cr ratio climbing, will give judicious fluids today.    Afebrile.  Inflammatory markers trending. -Continue remdesivir, day 4 of 5 -Continue dexamethasone, day 4 -Consult dietitian -Continue to prompt patient to drink fluids/Ensure every 3 hours -Cautious with fluid balance, strict intake and output -IV fluids briefly today   Hypertension Blood pressure controlled -Hold lisinopril    Hypokalemia Magnesium replete.  K normal today.   Dementia  At baseline patient lives with daughter, is able to make needs known, interactive. -Hold memantine, no clinical utility in acute setting -Continue mirtazapine for appetite stimulation  CKD IIIa Baseline Cr ~0.9-1, stable relative to baseline  At risk for malnutrition BMI 18 at baseline -Consult dietitian -Continue Ensure          Disposition: Patient was admitted with COVID-19 and severe encephalopathy.  At baseline she is able to communicate and make her needs known, ambulate and participate in self-cares, however at present she is severely altered, unable to speak intelligibly, or follow commands.    Her oral intake was slightly better today, although overall her oral intake may not improve enough to keep up with her fluid and nutritional needs, and prognosis is very grim.  I will continue IV remdesivir, IV electrolytes, and IV fluids as needed.  We will continue to review goals of care.  The  patient cannot be discharged until [place] have been ascertained, or the patient is able to take enough oral intake to keep up with her needs.          MDM: The below labs and imaging reports reviewed and summarized above.  Medication management as above.       DVT prophylaxis: Lovenox Code Status: Full code Family Communication: Daughter by phone    Consultants:     Procedures:     Antimicrobials:   Only remdesivir  Culture data:              Subjective: The patient is restless, trying out of bed.  She remains severely encephalopathic.  She has no cough, chest pain, confusion.  She is eating very little.          Objective: Vitals:   06/10/19 1943 06/11/19 0426 06/11/19 0734 06/11/19 0757  BP: (!) 142/118 (!) 153/70 (!) 151/71 (!) 134/52  Pulse: 99 89 88 83  Resp: 20 19 15 18   Temp: 97.7 F (36.5 C) 98.6 F (37 C) 98.5 F (36.9 C) 97.6 F (36.4 C)  TempSrc: Oral Oral Oral Oral  SpO2: 93% 95% 93% 93%  Weight:      Height:        Intake/Output Summary (Last 24 hours) at 06/11/2019 0826 Last data filed at 06/10/2019 1600 Gross per 24 hour  Intake 444.5 ml  Output --  Net 444.5 ml   Filed Weights   06/09/19 0300  Weight: 48.9 kg    Examination: General appearance:  Frail elderly adult female, encephalopathic.   HEENT: Anicteric, conjunctiva pink, lids and lashes normal. No nasal deformity, discharge, epistaxis.  Lips dry, oropharynx tacky dry, does not open mouth further, but the mucous membranes appear dry.  Hearing seems diminished.  Skin: Warm and dry.  No suspicious rashes or lesions. Cardiac: RRR, no murmurs appreciated.  No LE edema.    Respiratory: Tachypneic, no accessory muscle use.  CTAB without rales or wheezes. Abdomen: Abdomen soft.  No grimace to palpation, no voluntary or involuntary guarding. No ascites, distension, hepatosplenomegaly.   MSK: No deformities or effusions of the large joints of the upper or lower extremities  bilaterally.  Diffuse loss of subcutaneous muscle mass and fat. Neuro: Awake makes eye contact.  Eyes follow me across the midline.  Able to speak her name.  Muscle tone diminished, but present in bilateral upper extremities, appears symmetrically weak in the legs, does not follow commands.  However she lifts both arms symmetrically to command.  Speech is hypophonic, but she is intelligible, not dysarthric.  Face symmetric.  Psych: Severely encephalopathic, hypoactive delirium, affect blunted, judgment severely impaired.       Data Reviewed: I have personally reviewed following labs and imaging studies:  CBC: Recent Labs  Lab 06/08/19 2328 06/09/19 0100 06/10/19 0412 06/11/19 0420  WBC 8.0 7.7 11.7* 8.9  NEUTROABS  --   --  10.0* 7.4  HGB 12.0 11.1* 13.4 12.5  HCT 36.1 34.0* 41.3 37.4  MCV 98.6 100.0 98.3 96.6  PLT 172 173 200 183   Basic Metabolic Panel: Recent Labs  Lab 06/08/19 2328 06/10/19 0412 06/11/19 0420  NA 134* 136 136  K 3.9 2.9* 3.9  CL 99 96* 97*  CO2 24 26 25   GLUCOSE 114* 148* 148*  BUN 19 21 31*  CREATININE 0.96 1.04* 0.92  CALCIUM 9.0 9.2 9.3  MG  --  1.9  --    GFR: Estimated Creatinine Clearance: 32.6 mL/min (by C-G formula based on SCr of 0.92 mg/dL). Liver Function Tests: Recent Labs  Lab 06/08/19 2328 06/10/19 0412 06/11/19 0420  AST 30 57* 65*  ALT 16 57* 26  ALKPHOS 74 54 42  BILITOT 0.7 0.6 0.7  PROT 6.9 7.2 6.6  ALBUMIN 3.8 3.9 3.5   No results for input(s): LIPASE, AMYLASE in the last 168 hours. No results for input(s): AMMONIA in the last 168 hours. Coagulation Profile: No results for input(s): INR, PROTIME in the last 168 hours. Cardiac Enzymes: No results for input(s): CKTOTAL, CKMB, CKMBINDEX, TROPONINI in the last 168 hours. BNP (last 3 results) No results for input(s): PROBNP in the last 8760 hours. HbA1C: No results for input(s): HGBA1C in the last 72 hours. CBG: No results for input(s): GLUCAP in the last 168  hours. Lipid Profile: No results for input(s): CHOL, HDL, LDLCALC, TRIG, CHOLHDL, LDLDIRECT in the last 72 hours. Thyroid Function Tests: No results for input(s): TSH, T4TOTAL, FREET4, T3FREE, THYROIDAB in the last 72 hours. Anemia Panel: Recent Labs    06/09/19 0100  FERRITIN 80   Urine analysis:    Component Value Date/Time   COLORURINE YELLOW (A) 06/08/2019 2328   APPEARANCEUR CLEAR (A) 06/08/2019 2328   APPEARANCEUR Hazy 01/03/2014 0215   LABSPEC 1.015 06/08/2019 2328   LABSPEC 1.009 01/03/2014 0215   PHURINE 5.0 06/08/2019 2328   GLUCOSEU NEGATIVE 06/08/2019 2328   GLUCOSEU Negative 01/03/2014 0215   HGBUR NEGATIVE 06/08/2019 2328   BILIRUBINUR NEGATIVE 06/08/2019 2328   BILIRUBINUR Negative 01/03/2014 0215  KETONESUR NEGATIVE 06/08/2019 2328   PROTEINUR NEGATIVE 06/08/2019 2328   NITRITE NEGATIVE 06/08/2019 2328   LEUKOCYTESUR NEGATIVE 06/08/2019 2328   LEUKOCYTESUR 3+ 01/03/2014 0215   Sepsis Labs: @LABRCNTIP (procalcitonin:4,lacticacidven:4)  )No results found for this or any previous visit (from the past 240 hour(s)).       Radiology Studies: No results found.      Scheduled Meds: . albuterol  2 puff Inhalation Q6H  . vitamin C  500 mg Oral Daily  . dexamethasone (DECADRON) injection  6 mg Intravenous Q24H  . enoxaparin (LOVENOX) injection  40 mg Subcutaneous Q24H  . feeding supplement (ENSURE ENLIVE)  237 mL Oral BID BM  . mouth rinse  15 mL Mouth Rinse BID  . mirtazapine  15 mg Oral QHS  . multivitamin with minerals  1 tablet Oral Daily  . zinc sulfate  220 mg Oral Daily   Continuous Infusions: . remdesivir 100 mg in NS 100 mL 100 mg (06/10/19 1217)     LOS: 2 days    Time spent: 25 minutes    06/12/19, MD [facility] 06/11/2019, 8:26 AM     Please page though AMION or Epic secure chat:  For password, contact charge nurse

## 2019-06-12 ENCOUNTER — Inpatient Hospital Stay: Payer: Medicare Other

## 2019-06-12 LAB — COMPREHENSIVE METABOLIC PANEL
ALT: 31 U/L (ref 0–44)
AST: 55 U/L — ABNORMAL HIGH (ref 15–41)
Albumin: 3.3 g/dL — ABNORMAL LOW (ref 3.5–5.0)
Alkaline Phosphatase: 46 U/L (ref 38–126)
Anion gap: 9 (ref 5–15)
BUN: 35 mg/dL — ABNORMAL HIGH (ref 8–23)
CO2: 24 mmol/L (ref 22–32)
Calcium: 8.6 mg/dL — ABNORMAL LOW (ref 8.9–10.3)
Chloride: 104 mmol/L (ref 98–111)
Creatinine, Ser: 0.83 mg/dL (ref 0.44–1.00)
GFR calc Af Amer: >60 mL/min (ref >60)
GFR calc non Af Amer: >60 mL/min (ref >60)
Glucose, Bld: 168 mg/dL — ABNORMAL HIGH (ref 70–99)
Potassium: 3.5 mmol/L (ref 3.5–5.1)
Sodium: 137 mmol/L (ref 135–145)
Total Bilirubin: 0.9 mg/dL (ref 0.3–1.2)
Total Protein: 6.3 g/dL — ABNORMAL LOW (ref 6.5–8.1)

## 2019-06-12 LAB — CBC WITH DIFFERENTIAL/PLATELET
Abs Immature Granulocytes: 0.02 10*3/uL (ref 0.00–0.07)
Basophils Absolute: 0 10*3/uL (ref 0.0–0.1)
Basophils Relative: 0 %
Eosinophils Absolute: 0 10*3/uL (ref 0.0–0.5)
Eosinophils Relative: 0 %
HCT: 33.7 % — ABNORMAL LOW (ref 36.0–46.0)
Hemoglobin: 11.5 g/dL — ABNORMAL LOW (ref 12.0–15.0)
Immature Granulocytes: 0 %
Lymphocytes Relative: 10 %
Lymphs Abs: 0.8 10*3/uL (ref 0.7–4.0)
MCH: 32.9 pg (ref 26.0–34.0)
MCHC: 34.1 g/dL (ref 30.0–36.0)
MCV: 96.3 fL (ref 80.0–100.0)
Monocytes Absolute: 0.5 10*3/uL (ref 0.1–1.0)
Monocytes Relative: 6 %
Neutro Abs: 6.3 10*3/uL (ref 1.7–7.7)
Neutrophils Relative %: 84 %
Platelets: 182 10*3/uL (ref 150–400)
RBC: 3.5 MIL/uL — ABNORMAL LOW (ref 3.87–5.11)
RDW: 12.4 % (ref 11.5–15.5)
WBC: 7.6 10*3/uL (ref 4.0–10.5)
nRBC: 0 % (ref 0.0–0.2)

## 2019-06-12 LAB — FIBRIN DERIVATIVES D-DIMER (ARMC ONLY): Fibrin derivatives D-dimer (ARMC): 949.77 ng/mL (FEU) — ABNORMAL HIGH (ref 0.00–499.00)

## 2019-06-12 LAB — C-REACTIVE PROTEIN: CRP: 10 mg/dL — ABNORMAL HIGH (ref <1.0)

## 2019-06-12 MED ORDER — LISINOPRIL 20 MG PO TABS
20.0000 mg | ORAL_TABLET | Freq: Every day | ORAL | Status: DC
  Administered 2019-06-12 – 2019-06-17 (×6): 20 mg via ORAL
  Filled 2019-06-12 (×6): qty 1

## 2019-06-12 NOTE — Progress Notes (Signed)
PROGRESS NOTE    JENASCIA BUMPASS  VOJ:[id number] DOB: April 21, 1929 DOA: 06/08/2019 PCP: Gracelyn Nurse, MD      Brief Narrative:  Mrs. Benham is a 86 y.o. F with dementia who presented with weakness, lethargy and cough.    In the ER, patient had low grade temp, RR 24, CXR clear.  COVID positive.           Assessment & Plan:  Covid pneumonitis with hypoxia Covid-related acute metabolic encephalopathy She remains severely encephalopathic.  Refusing food.  Minimal fluid intake.    O2 sats normal on room air.  BUN to Cr ratio climbing, will give judicious fluids today.    Afebrile. Still severely encephalopathic. -Continue remdesivir, day 5 of 5 -Continue dexamethasone, day 5 -Consult dietitian -Continue to prompt patient to drink fluids/Ensure every 3 hours -Cautious with fluid balance, strict intake and output -IV fluids briefly today -CT scan to rule out occult stroke    Hypertension Blood pressure elevated -Resume lisinopril    Hypokalemia Magnesium replete.  K normal again.   Dementia  At baseline patient lives with daughter, is able to make needs known, interactive. -Hold memantine, no clinical utility in acute setting -Continue mirtazapine for appetite stimulation  CKD IIIa Baseline Cr ~0.9-1, stable relative to baseline  At risk for malnutrition BMI 18 at baseline -Consult dietitian -Continue Ensure          Disposition: The patient was admitted with COVID-19 and severe encephalopathy.  At baseline she is able to communicate and make her needs known, ambulate and participate in self-cares, however at present she is severely altered, unable to speak intelligibly, or follow commands.    At present her oral intake is insufficient to handle her fluid and nutritional needs.  I will continue IV remdesivir, electrolytes, and fluids as needed.  We will carefully monitor her oral intake.  If her oral intake improves to the point that she can transition  to rehab we will make a safe disposition, otherwise will discuss family hospice.       MDM: The below labs and imaging reports reviewed and summarized above.  Medication management as above.      DVT prophylaxis: Lovenox Code Status: Full code Family Communication: Daughter by phone    Consultants:     Procedures:     Antimicrobials:   Only remdesivir  Culture data:              Subjective: Patient wakes up to participate in eating to some extent with nursing, but has to be prompted for all oral intake.  She remains severely confused.  Her oral intake is limited.  She is still incoherent, somewhat restless, unable to follow commands consistently.          Objective: Vitals:   06/11/19 1459 06/11/19 2028 06/12/19 0440 06/12/19 0756  BP: (!) 150/85 (!) 154/65 (!) 169/81 (!) 150/82  Pulse: 75 89 88 81  Resp: 19 20 20 18   Temp: (!) 97.5 F (36.4 C) 98 F (36.7 C) 98.3 F (36.8 C) 98.1 F (36.7 C)  TempSrc: Oral Oral Oral Oral  SpO2: 94% 94% 95% 96%  Weight:      Height:        Intake/Output Summary (Last 24 hours) at 06/12/2019 1535 Last data filed at 06/11/2019 2028 Gross per 24 hour  Intake --  Output 0 ml  Net 0 ml   Filed Weights   06/09/19 0300  Weight: 48.9 kg    Examination:  General appearance: thin frail adult female, sleeping but rousable, sluggish   HEENT: Anicteric, conjunctiva pink, lids and lashes normal. No nasal deformity, discharge, epistaxis.  Lips dry, oropharynx appears dry, but patient does not open mouth to command.   Skin: Warm and dry.  No suspicious rashes or lesions. Cardiac: RRR, no murmurs appreciated.  No LE edema.    Respiratory: Normal respiratory rate and rhythm.  CTAB without rales or wheezes. Abdomen: Abdomen soft.  No grimace to palpation no guarding. No ascites, distension, hepatosplenomegaly.   MSK: Severe diffuse loss of subcutaneous muscle mass intact. Neuro: Sleeping, sluggishly arousable, makes  eye contact, then closes eyes again, attempts to weakly make verbalizations, but these are incoherent, she follows some commands (raise arms) but not established.  Face symmetric, upper arm extremity strength appears normal, follows me/tracks across the midline. Psych: Proactive delirium, attention diminished.        Data Reviewed: I have personally reviewed following labs and imaging studies:  CBC: Recent Labs  Lab 06/08/19 2328 06/09/19 0100 06/10/19 0412 06/11/19 0420 06/12/19 0748  WBC 8.0 7.7 11.7* 8.9 7.6  NEUTROABS  --   --  10.0* 7.4 6.3  HGB 12.0 11.1* 13.4 12.5 11.5*  HCT 36.1 34.0* 41.3 37.4 33.7*  MCV 98.6 100.0 98.3 96.6 96.3  PLT 172 173 200 183 182   Basic Metabolic Panel: Recent Labs  Lab 06/08/19 2328 06/10/19 0412 06/11/19 0420 06/12/19 0748  NA 134* 136 136 137  K 3.9 2.9* 3.9 3.5  CL 99 96* 97* 104  CO2 24 26 25 24   GLUCOSE 114* 148* 148* 168*  BUN 19 21 31* 35*  CREATININE 0.96 1.04* 0.92 0.83  CALCIUM 9.0 9.2 9.3 8.6*  MG  --  1.9  --   --    GFR: Estimated Creatinine Clearance: 36.2 mL/min (by C-G formula based on SCr of 0.83 mg/dL). Liver Function Tests: Recent Labs  Lab 06/08/19 2328 06/10/19 0412 06/11/19 0420 06/12/19 0748  AST 30 57* 65* 55*  ALT 16 57* 26 31  ALKPHOS 74 54 42 46  BILITOT 0.7 0.6 0.7 0.9  PROT 6.9 7.2 6.6 6.3*  ALBUMIN 3.8 3.9 3.5 3.3*   No results for input(s): LIPASE, AMYLASE in the last 168 hours. No results for input(s): AMMONIA in the last 168 hours. Coagulation Profile: No results for input(s): INR, PROTIME in the last 168 hours. Cardiac Enzymes: No results for input(s): CKTOTAL, CKMB, CKMBINDEX, TROPONINI in the last 168 hours. BNP (last 3 results) No results for input(s): PROBNP in the last 8760 hours. HbA1C: No results for input(s): HGBA1C in the last 72 hours. CBG: No results for input(s): GLUCAP in the last 168 hours. Lipid Profile: No results for input(s): CHOL, HDL, LDLCALC, TRIG,  CHOLHDL, LDLDIRECT in the last 72 hours. Thyroid Function Tests: No results for input(s): TSH, T4TOTAL, FREET4, T3FREE, THYROIDAB in the last 72 hours. Anemia Panel: No results for input(s): VITAMINB12, FOLATE, FERRITIN, TIBC, IRON, RETICCTPCT in the last 72 hours. Urine analysis:    Component Value Date/Time   COLORURINE YELLOW (A) 06/08/2019 2328   APPEARANCEUR CLEAR (A) 06/08/2019 2328   APPEARANCEUR Hazy 01/03/2014 0215   LABSPEC 1.015 06/08/2019 2328   LABSPEC 1.009 01/03/2014 0215   PHURINE 5.0 06/08/2019 2328   GLUCOSEU NEGATIVE 06/08/2019 2328   GLUCOSEU Negative 01/03/2014 0215   HGBUR NEGATIVE 06/08/2019 2328   BILIRUBINUR NEGATIVE 06/08/2019 2328   BILIRUBINUR Negative 01/03/2014 0215   KETONESUR NEGATIVE 06/08/2019 2328   PROTEINUR NEGATIVE 06/08/2019  [place] 06/08/2019 [place] 06/08/2019 2328   LEUKOCYTESUR 3+ 01/03/2014 0215   Sepsis Labs: @LABRCNTIP (procalcitonin:4,lacticacidven:4)  )No results found for this or any previous visit (from the past 240 hour(s)).       Radiology Studies: No results found.      Scheduled Meds: . albuterol  2 puff Inhalation Q6H  . vitamin C  500 mg Oral Daily  . dexamethasone (DECADRON) injection  6 mg Intravenous Q24H  . enoxaparin (LOVENOX) injection  40 mg Subcutaneous Q24H  . feeding supplement (ENSURE ENLIVE)  237 mL Oral BID BM  . lisinopril  20 mg Oral Daily  . mouth rinse  15 mL Mouth Rinse BID  . mirtazapine  15 mg Oral QHS  . multivitamin with minerals  1 tablet Oral Daily  . zinc sulfate  220 mg Oral Daily   Continuous Infusions:    LOS: 3 days    Time spent: 25 minutes    Edwin Dada, MD [facility] 06/12/2019, 3:35 PM     Please page though Exline or Epic secure chat:  For password, contact charge nurse

## 2019-06-12 NOTE — Progress Notes (Signed)
Spoke with patients daughter June about an update on her mother. Will use ipad this afternoon to allow patient to facetime with daughter.

## 2019-06-12 NOTE — Progress Notes (Signed)
This RN assisted patient with face-timing with patients daughter, June and husband this afternoon. Patient ate 25% of breakfast with a full cup of coffee, 15% of lunch and half a cup of tea, 5 bites of dinner and a full cup of water. Patient drank 75% of ensure this morning and 25% of ensure this afternoon.

## 2019-06-13 LAB — CBC WITH DIFFERENTIAL/PLATELET
Abs Immature Granulocytes: 0.04 10*3/uL (ref 0.00–0.07)
Basophils Absolute: 0 10*3/uL (ref 0.0–0.1)
Basophils Relative: 0 %
Eosinophils Absolute: 0 10*3/uL (ref 0.0–0.5)
Eosinophils Relative: 0 %
HCT: 39.7 % (ref 36.0–46.0)
Hemoglobin: 13.2 g/dL (ref 12.0–15.0)
Immature Granulocytes: 1 %
Lymphocytes Relative: 8 %
Lymphs Abs: 0.7 10*3/uL (ref 0.7–4.0)
MCH: 32 pg (ref 26.0–34.0)
MCHC: 33.2 g/dL (ref 30.0–36.0)
MCV: 96.4 fL (ref 80.0–100.0)
Monocytes Absolute: 0.4 10*3/uL (ref 0.1–1.0)
Monocytes Relative: 5 %
Neutro Abs: 7.1 10*3/uL (ref 1.7–7.7)
Neutrophils Relative %: 86 %
Platelets: 227 10*3/uL (ref 150–400)
RBC: 4.12 MIL/uL (ref 3.87–5.11)
RDW: 12 % (ref 11.5–15.5)
WBC: 8.2 10*3/uL (ref 4.0–10.5)
nRBC: 0 % (ref 0.0–0.2)

## 2019-06-13 LAB — COMPREHENSIVE METABOLIC PANEL
ALT: 44 U/L (ref 0–44)
AST: 64 U/L — ABNORMAL HIGH (ref 15–41)
Albumin: 3.6 g/dL (ref 3.5–5.0)
Alkaline Phosphatase: 56 U/L (ref 38–126)
Anion gap: 10 (ref 5–15)
BUN: 31 mg/dL — ABNORMAL HIGH (ref 8–23)
CO2: 28 mmol/L (ref 22–32)
Calcium: 9 mg/dL (ref 8.9–10.3)
Chloride: 102 mmol/L (ref 98–111)
Creatinine, Ser: 0.79 mg/dL (ref 0.44–1.00)
GFR calc Af Amer: >60 mL/min (ref >60)
GFR calc non Af Amer: >60 mL/min (ref >60)
Glucose, Bld: 155 mg/dL — ABNORMAL HIGH (ref 70–99)
Potassium: 3.4 mmol/L — ABNORMAL LOW (ref 3.5–5.1)
Sodium: 140 mmol/L (ref 135–145)
Total Bilirubin: 1.1 mg/dL (ref 0.3–1.2)
Total Protein: 7.3 g/dL (ref 6.5–8.1)

## 2019-06-13 LAB — C-REACTIVE PROTEIN: CRP: 8.3 mg/dL — ABNORMAL HIGH (ref <1.0)

## 2019-06-13 LAB — FIBRIN DERIVATIVES D-DIMER (ARMC ONLY): Fibrin derivatives D-dimer (ARMC): 959.18 ng/mL (FEU) — ABNORMAL HIGH (ref 0.00–499.00)

## 2019-06-13 MED ORDER — POTASSIUM CHLORIDE IN NACL 20-0.9 MEQ/L-% IV SOLN
INTRAVENOUS | Status: DC
  Filled 2019-06-13: qty 1000

## 2019-06-13 NOTE — Progress Notes (Signed)
Breakfast: 10% and 1 cup of water. Lunch: 10% and a cup of tea. Dinner: A few bites and sips of tea.  100% of ensure this morning, 50% of ensure this afternoon.

## 2019-06-13 NOTE — Progress Notes (Addendum)
This RN gave update to patients daughter, June. Asked if she would like to facetime with patient. Patients daughter stated her husband was out of the house and she could not since he has an iphone and would appreciate if she could speak to her mom when he got back. Will attempt to reach out to daughter again later today.   1630 update: Facetimed with daughter June and husband with unit ipad

## 2019-06-13 NOTE — Progress Notes (Signed)
   2000 - Patient had 8 more bites of dinner before she stated, "I can't eat anymore." Patient finished full cup of ice lemon water. Refused offer of Ensure. Took pill before bedtime with a few sips of juice.   2400 - Continued to offer patient sips of water upon entering the room. Patient agreeable to fluid intake. Declines any offers of solid food.

## 2019-06-13 NOTE — Progress Notes (Signed)
PROGRESS NOTE    Angel Simmons  YIF:[id number] DOB: [birth year] DOA: 06/08/2019 PCP: Gracelyn Nurse, MD      Brief Narrative:  Angel Simmons is a 86 y.o. F with dementia who presented with weakness, lethargy and cough.    In the ER, patient had low grade temp, RR 24, CXR clear.  COVID positive.           Assessment & Plan:  Covid pneumonitis with hypoxia Covid-related acute metabolic encephalopathy Patient admitted with COVID-19 positive test (U07.1, COVID-19) with Acute Pneumonia (J12.89, Other viral pneumonia) without respiratory failure in setting of the ongoing COVID-19 pandemic.  She was weaned quickly to room air, but is remained severely encephalopathic, and was very little oral intake, sometimes refusing food.  CT head ruled out stroke, she had no focal neurological findings.  Completed 5 days remdesivir.  Mentation slightly more alert today -Continue dexamethasone, day 6 -Consult dietitian -Continue to prompt patient to drink fluids/Ensure every 3 hours -Repeat IV fluids     Hypertension Blood pressure slightly -Continue lisinopril    Hypokalemia Magnesium replete.  Potassium slightly low -Supplement potassium  Dementia  At baseline patient lives with daughter, is able to make needs known, interactive. -Hold memantine, no clinical utility in acute setting -Continue mirtazapine for appetite stimulation  CKD IIIa Baseline Cr ~0.9-1, stable relative to baseline  Severe protein calorie malnutrition As evidenced by BMI 18, severe loss of subcutaneous muscle mass and fat, minimal oral intake documented by nursing. -Consult dietitian -Continue Ensure          Disposition: The patient was admitted with COVID-19 and severe encephalopathy.  At baseline she is able to communicate and make her needs known, ambulate and participate in self-cares, however at present she is severely altered, unable to speak intelligibly, or follow commands.    The  patient continues to have very little oral intake, insufficient to meet her fluid and nutritional needs.  We will need to continue IV fluids, and monitor her oral intake.  If her oral intake improves to the point that she can transition to rehab we will make a safe disposition, otherwise will discuss family hospice.       MDM: The below labs and imaging reports reviewed and summarized above.  Medication management as above.      DVT prophylaxis: Lovenox Code Status: Full code Family Communication: Daughter by phone    Consultants:     Procedures:     Antimicrobials:   Only remdesivir  Culture data:              Subjective: Patient is had at most 25% of meals over the last 24 hours.  She has been able to complete 25-75% of bottle of Ensure in last 24 hours.  She has had no fever, no vomiting, no diarrhea, no respiratory distress.       Objective: Vitals:   06/12/19 2012 06/13/19 0456 06/13/19 0749 06/13/19 1530  BP: (!) 175/73 (!) 177/85 (!) 162/80 (!) 148/70  Pulse: 70 70 85 92  Resp: 18 18 18 16   Temp: 97.7 F (36.5 C) 99.2 F (37.3 C) 98.4 F (36.9 C) 97.9 F (36.6 C)  TempSrc: Oral Oral    SpO2: 99% 98% 97% 96%  Weight:  59.4 kg    Height:        Intake/Output Summary (Last 24 hours) at 06/13/2019 1635 Last data filed at 06/13/2019 0024 Gross per 24 hour  Intake --  Output 0 ml  Net 0 ml   Filed Weights   06/09/19 0300 06/13/19 0456  Weight: 48.9 kg 59.4 kg    Examination: General appearance: Thin frail elderly adult female, awake, listless, makes eye contact, appears feeble.   HEENT: Anicteric, conjunctiva pink, lids and lashes normal. No nasal deformity, discharge, epistaxis.  Lips dry, oropharynx dry, no oral lesions, hearing diminished.   Skin: Warm and dry.  No suspicious rashes or lesions. Cardiac: RRR, no murmurs appreciated.  No LE edema.    Respiratory: Normal respiratory rate and rhythm.  CTAB without rales or wheezes.   Respiratory effort shallow. Abdomen: Abdomen soft.  No grimace to palpation.. No ascites, distension, hepatosplenomegaly.   MSK: No deformities or effusions of the large joints of the upper or lower extremities bilaterally.  Severe loss of subcutaneous muscle mass and fat. Neuro: Awake but sluggish.  Moves upper extremities symmetrically, but with generalized weakness.  Speech fluent.  N   Psych: Attention diminished, affect blunted, judgment insight appear severely impaired.         Data Reviewed: I have personally reviewed following labs and imaging studies:  CBC: Recent Labs  Lab 06/09/19 0100 06/10/19 0412 06/11/19 0420 06/12/19 0748 06/13/19 0648  WBC 7.7 11.7* 8.9 7.6 8.2  NEUTROABS  --  10.0* 7.4 6.3 7.1  HGB 11.1* 13.4 12.5 11.5* 13.2  HCT 34.0* 41.3 37.4 33.7* 39.7  MCV 100.0 98.3 96.6 96.3 96.4  PLT 173 200 183 182 227   Basic Metabolic Panel: Recent Labs  Lab 06/08/19 2328 06/10/19 0412 06/11/19 0420 06/12/19 0748 06/13/19 0648  NA 134* 136 136 137 140  K 3.9 2.9* 3.9 3.5 3.4*  CL 99 96* 97* 104 102  CO2 24 26 25 24 28   GLUCOSE 114* 148* 148* 168* 155*  BUN 19 21 31* 35* 31*  CREATININE 0.96 1.04* 0.92 0.83 0.79  CALCIUM 9.0 9.2 9.3 8.6* 9.0  MG  --  1.9  --   --   --    GFR: Estimated Creatinine Clearance: 42 mL/min (by C-G formula based on SCr of 0.79 mg/dL). Liver Function Tests: Recent Labs  Lab 06/08/19 2328 06/10/19 0412 06/11/19 0420 06/12/19 0748 06/13/19 0648  AST 30 57* 65* 55* 64*  ALT 16 57* 26 31 44  ALKPHOS 74 54 42 46 56  BILITOT 0.7 0.6 0.7 0.9 1.1  PROT 6.9 7.2 6.6 6.3* 7.3  ALBUMIN 3.8 3.9 3.5 3.3* 3.6   No results for input(s): LIPASE, AMYLASE in the last 168 hours. No results for input(s): AMMONIA in the last 168 hours. Coagulation Profile: No results for input(s): INR, PROTIME in the last 168 hours. Cardiac Enzymes: No results for input(s): CKTOTAL, CKMB, CKMBINDEX, TROPONINI in the last 168 hours. BNP (last 3  results) No results for input(s): PROBNP in the last 8760 hours. HbA1C: No results for input(s): HGBA1C in the last 72 hours. CBG: No results for input(s): GLUCAP in the last 168 hours. Lipid Profile: No results for input(s): CHOL, HDL, LDLCALC, TRIG, CHOLHDL, LDLDIRECT in the last 72 hours. Thyroid Function Tests: No results for input(s): TSH, T4TOTAL, FREET4, T3FREE, THYROIDAB in the last 72 hours. Anemia Panel: No results for input(s): VITAMINB12, FOLATE, FERRITIN, TIBC, IRON, RETICCTPCT in the last 72 hours. Urine analysis:    Component Value Date/Time   COLORURINE YELLOW (A) 06/08/2019 2328   APPEARANCEUR CLEAR (A) 06/08/2019 2328   APPEARANCEUR Hazy 01/03/2014 0215   LABSPEC 1.015 06/08/2019 2328   LABSPEC 1.009 01/03/2014 0215   PHURINE 5.0  06/08/2019 [place] 06/08/2019 2328   GLUCOSEU Negative 01/03/2014 0215   HGBUR NEGATIVE 06/08/2019 [place] 06/08/2019 2328   BILIRUBINUR Negative 01/03/2014 0215   KETONESUR NEGATIVE 06/08/2019 2328   PROTEINUR NEGATIVE 06/08/2019 2328   NITRITE NEGATIVE 06/08/2019 2328   LEUKOCYTESUR NEGATIVE 06/08/2019 2328   LEUKOCYTESUR 3+ 01/03/2014 0215   Sepsis Labs: @LABRCNTIP (procalcitonin:4,lacticacidven:4)  )No results found for this or any previous visit (from the past 240 hour(s)).       Radiology Studies: CT HEAD WO CONTRAST  Result Date: 06/12/2019 CLINICAL DATA:  COVID-19 positive, severe encephalopathy EXAM: CT HEAD WITHOUT CONTRAST TECHNIQUE: Contiguous axial images were obtained from the base of the skull through the vertex without intravenous contrast. COMPARISON:  CT head Sep 12, 2017 FINDINGS: Brain: No evidence of acute infarction, hemorrhage, hydrocephalus, extra-axial collection or mass lesion/mass effect. Symmetric prominence of the ventricles, cisterns and sulci compatible with parenchymal volume loss. Mixed patchy and confluent areas of white matter hypoattenuation are most  compatible with advanced chronic microvascular angiopathy. Senescent mineralization of the basal ganglia Vascular: Atherosclerotic calcification of the carotid siphons. No hyperdense vessel. Skull: No calvarial fracture or suspicious osseous lesion. No scalp swelling or hematoma. Small lentiform scalp lipoma over the left temporal region measuring up to 4 mm in maximal thickness is unchanged from priors. Sinuses/Orbits: Paranasal sinuses and mastoid air cells are predominantly clear. Orbital structures are unremarkable aside from prior lens extractions. Other: None IMPRESSION: 1. No acute intracranial findings. 2. Stable chronic microvascular angiopathy and parenchymal volume loss. Electronically Signed   By: Lovena Le M.D.   On: 06/12/2019 19:28        Scheduled Meds:  albuterol  2 puff Inhalation Q6H   vitamin C  500 mg Oral Daily   dexamethasone (DECADRON) injection  6 mg Intravenous Q24H   enoxaparin (LOVENOX) injection  40 mg Subcutaneous Q24H   feeding supplement (ENSURE ENLIVE)  237 mL Oral BID BM   lisinopril  20 mg Oral Daily   mouth rinse  15 mL Mouth Rinse BID   mirtazapine  15 mg Oral QHS   multivitamin with minerals  1 tablet Oral Daily   zinc sulfate  220 mg Oral Daily   Continuous Infusions:    LOS: 4 days    Time spent: 25 minutes    Edwin Dada, MD [facility] 06/13/2019, 4:35 PM     Please page though [place] or Epic secure chat:  For password, contact charge nurse

## 2019-06-14 LAB — COMPREHENSIVE METABOLIC PANEL
ALT: 48 U/L — ABNORMAL HIGH (ref 0–44)
AST: 57 U/L — ABNORMAL HIGH (ref 15–41)
Albumin: 3.5 g/dL (ref 3.5–5.0)
Alkaline Phosphatase: 52 U/L (ref 38–126)
Anion gap: 10 (ref 5–15)
BUN: 36 mg/dL — ABNORMAL HIGH (ref 8–23)
CO2: 25 mmol/L (ref 22–32)
Calcium: 8.8 mg/dL — ABNORMAL LOW (ref 8.9–10.3)
Chloride: 104 mmol/L (ref 98–111)
Creatinine, Ser: 0.88 mg/dL (ref 0.44–1.00)
GFR calc Af Amer: >60 mL/min (ref >60)
GFR calc non Af Amer: 59 mL/min — ABNORMAL LOW (ref >60)
Glucose, Bld: 165 mg/dL — ABNORMAL HIGH (ref 70–99)
Potassium: 3.7 mmol/L (ref 3.5–5.1)
Sodium: 139 mmol/L (ref 135–145)
Total Bilirubin: 1.1 mg/dL (ref 0.3–1.2)
Total Protein: 6.9 g/dL (ref 6.5–8.1)

## 2019-06-14 LAB — CBC WITH DIFFERENTIAL/PLATELET
Abs Immature Granulocytes: 0.03 10*3/uL (ref 0.00–0.07)
Basophils Absolute: 0 10*3/uL (ref 0.0–0.1)
Basophils Relative: 0 %
Eosinophils Absolute: 0 10*3/uL (ref 0.0–0.5)
Eosinophils Relative: 0 %
HCT: 37.2 % (ref 36.0–46.0)
Hemoglobin: 12.3 g/dL (ref 12.0–15.0)
Immature Granulocytes: 0 %
Lymphocytes Relative: 9 %
Lymphs Abs: 0.7 10*3/uL (ref 0.7–4.0)
MCH: 32 pg (ref 26.0–34.0)
MCHC: 33.1 g/dL (ref 30.0–36.0)
MCV: 96.9 fL (ref 80.0–100.0)
Monocytes Absolute: 0.4 10*3/uL (ref 0.1–1.0)
Monocytes Relative: 5 %
Neutro Abs: 6.3 10*3/uL (ref 1.7–7.7)
Neutrophils Relative %: 86 %
Platelets: 252 10*3/uL (ref 150–400)
RBC: 3.84 MIL/uL — ABNORMAL LOW (ref 3.87–5.11)
RDW: 12.1 % (ref 11.5–15.5)
WBC: 7.4 10*3/uL (ref 4.0–10.5)
nRBC: 0 % (ref 0.0–0.2)

## 2019-06-14 LAB — FIBRIN DERIVATIVES D-DIMER (ARMC ONLY): Fibrin derivatives D-dimer (ARMC): 840.6 ng/mL (FEU) — ABNORMAL HIGH (ref 0.00–499.00)

## 2019-06-14 LAB — C-REACTIVE PROTEIN: CRP: 3.2 mg/dL — ABNORMAL HIGH (ref <1.0)

## 2019-06-14 NOTE — Progress Notes (Signed)
   Patient only drank full cup of water overnight. Refused offer of snacks.

## 2019-06-14 NOTE — Progress Notes (Signed)
PROGRESS NOTE    Angel Simmons  ERX:[id number] DOB: May 27, 1928 DOA: 06/08/2019 PCP: Gracelyn Nurse, MD      Brief Narrative:  Angel Simmons is a 86 y.o. F with dementia who presented with weakness, lethargy and cough.    In the ER, patient had low grade temp, RR 24, CXR clear.  COVID positive.           Assessment & Plan:  Covid pneumonitis with hypoxia Covid-related acute metabolic encephalopathy Patient admitted with COVID-19 with Acute Pneumonia without respiratory failure in setting of the ongoing COVID-19 pandemic.  She was started on remdesivir and steroids, weaned quickly to room air, but remained severely encephalopathic, with very little oral intake, sometimes refusing food.  CT head ruled out stroke, she had no focal neurological findings.  Completed 5 days remdesivir.  Since then, we have continued dexamethasone.    2 days ago, the patient was lethargic, nearly comatose.  Yesterday she would make eye contact, and mumbled answers, and today mentation is remarkably better.  She is asking me appropriate questions about the weather outside, and is appropriate with nursing.   -Continue dexamethasone, day 7 -Consult dietitian -Continue to prompt patient to drink fluids/Ensure every 3 hours -Nursing documentation of oral intake each day is very much appreciated -Strict intake and output     Hypertension Blood pressure slightly elevated -Continue lisinopril    Hypokalemia Magnesium replete.  K normal  Dementia  At baseline patient lives with daughter, is able to make needs known, interactive. -Hold memantine, no clinical utility in acute setting -Continue mirtazapine given appetite stimulation effects  CKD IIIa Baseline Cr ~0.9-1, stable relative to baseline  Severe protein calorie malnutrition As evidenced by BMI 18, severe loss of subcutaneous muscle mass and fat, minimal oral intake documented by nursing. -Consult dietitian -Continue  Ensure         Disposition: She was admitted with COVID-19 and severe encephalopathy.  At baseline she lives with her daughter and son-in-law, ambulates with a walker, and participates in self-cares.  Initially, the patient was nearly comatose, refusing all oral intake.  With remdesivir, IV fluids, and supportive nursing cares, the patient has improved in the last 48 hours, to the point that she is alert enough to participate in self-cares again.  Given her severe protein calorie malnutrition and dementia, with superimposed Covid encephalopathy (illness that likely will limit her oral intake for weeks, months, or permanently), the patient is at extremely high risk of failure to thrive.  This prognosis has been shared with family.  Fluids were turned off 1/30 in the evening, and she has now been off fluids for 24 hours.  I would recommend minimum 48 hours off fluids to confirm that the patient does not have worsening hypernatremia/renal insufficiency and that her oral intake is able to keep up with fluid needs, prior to discharge, at the earliest Tuesday to SNF.     MDM: The below labs and imaging reports reviewed and summarized above.  Medication management as above.       DVT prophylaxis: Lovenox Code Status: DNR Family Communication: Daughter/POA by phone    Consultants:     Procedures:     Antimicrobials:   Only remdesivir  Culture data:              Subjective: The patient has had improved mentation, slightly improved oral intake of solid food over the last 24 hours.  She has had no fever, vomiting, diarrhea, respiratory distress.  Objective: Vitals:   06/13/19 0749 06/13/19 1530 06/13/19 2026 06/14/19 0333  BP: (!) 162/80 (!) 148/70 139/74 (!) 159/89  Pulse: 85 92 78 88  Resp: 18 16 (!) 22   Temp: 98.4 F (36.9 C) 97.9 F (36.6 C) 98.4 F (36.9 C) 97.8 F (36.6 C)  TempSrc:   Oral Oral  SpO2: 97% 96% 98% 97%  Weight:    42.5  kg  Height:        Intake/Output Summary (Last 24 hours) at 06/14/2019 2706 Last data filed at 06/14/2019 0402 Gross per 24 hour  Intake 449.12 ml  Output --  Net 449.12 ml   Filed Weights   06/09/19 0300 06/13/19 0456 06/14/19 0333  Weight: 48.9 kg 59.4 kg 42.5 kg    Examination: General appearance: Frail elderly adult female, resting but alert and in no acute distress.   HEENT: Anicteric, conjunctiva pink, lids and lashes atrophic, but normal for age. No nasal deformity, discharge, epistaxis.  Lips dry, teeth normal. OP tacky dry, no oral lesions.  Hearing diminished. Skin: Warm and dry.  No suspicious rashes or lesions. Cardiac: RRR, no murmurs appreciated.  No LE edema.    Respiratory: Normal respiratory rate and rhythm.  CTAB without rales or wheezes. Abdomen: Abdomen soft.  No tenderness to palpation or guarding.. No ascites, distension, hepatosplenomegaly.   MSK: No deformities or effusions of the large joints of the upper or lower extremities bilaterally.  Few severe loss of subcutaneous muscle mass and fat. Neuro: Awake and alert to me.  Psychomotor slowing noted, but the patient is oriented to weather outside.  She moves all extremities equally and with normal coordination, but severe generalized weakness.  Speech fluent.    Psych: Sensorium intact and responding to questions, attention diminished. Affect blunted.  Judgment and insight appear impaired.           Data Reviewed: I have personally reviewed following labs and imaging studies:  CBC: Recent Labs  Lab 06/10/19 0412 06/11/19 0420 06/12/19 0748 06/13/19 0648 06/14/19 0640  WBC 11.7* 8.9 7.6 8.2 7.4  NEUTROABS 10.0* 7.4 6.3 7.1 6.3  HGB 13.4 12.5 11.5* 13.2 12.3  HCT 41.3 37.4 33.7* 39.7 37.2  MCV 98.3 96.6 96.3 96.4 96.9  PLT 200 183 182 227 237   Basic Metabolic Panel: Recent Labs  Lab 06/10/19 0412 06/11/19 0420 06/12/19 0748 06/13/19 0648 06/14/19 0640  NA 136 136 137 140 139  K 2.9*  3.9 3.5 3.4* 3.7  CL 96* 97* 104 102 104  CO2 26 25 24 28 25   GLUCOSE 148* 148* 168* 155* 165*  BUN 21 31* 35* 31* 36*  CREATININE 1.04* 0.92 0.83 0.79 0.88  CALCIUM 9.2 9.3 8.6* 9.0 8.8*  MG 1.9  --   --   --   --    GFR: Estimated Creatinine Clearance: 29.6 mL/min (by C-G formula based on SCr of 0.88 mg/dL). Liver Function Tests: Recent Labs  Lab 06/10/19 0412 06/11/19 0420 06/12/19 0748 06/13/19 0648 06/14/19 0640  AST 57* 65* 55* 64* 57*  ALT 57* 26 31 44 48*  ALKPHOS 54 42 46 56 52  BILITOT 0.6 0.7 0.9 1.1 1.1  PROT 7.2 6.6 6.3* 7.3 6.9  ALBUMIN 3.9 3.5 3.3* 3.6 3.5   No results for input(s): LIPASE, AMYLASE in the last 168 hours. No results for input(s): AMMONIA in the last 168 hours. Coagulation Profile: No results for input(s): INR, PROTIME in the last 168 hours. Cardiac Enzymes: No results for input(s):  CKTOTAL, CKMB, CKMBINDEX, TROPONINI in the last 168 hours. BNP (last 3 results) No results for input(s): PROBNP in the last 8760 hours. HbA1C: No results for input(s): HGBA1C in the last 72 hours. CBG: No results for input(s): GLUCAP in the last 168 hours. Lipid Profile: No results for input(s): CHOL, HDL, LDLCALC, TRIG, CHOLHDL, LDLDIRECT in the last 72 hours. Thyroid Function Tests: No results for input(s): TSH, T4TOTAL, FREET4, T3FREE, THYROIDAB in the last 72 hours. Anemia Panel: No results for input(s): VITAMINB12, FOLATE, FERRITIN, TIBC, IRON, RETICCTPCT in the last 72 hours. Urine analysis:    Component Value Date/Time   COLORURINE YELLOW (A) 06/08/2019 2328   APPEARANCEUR CLEAR (A) 06/08/2019 2328   APPEARANCEUR Hazy 01/03/2014 0215   LABSPEC 1.015 06/08/2019 2328   LABSPEC 1.009 01/03/2014 0215   PHURINE 5.0 06/08/2019 2328   GLUCOSEU NEGATIVE 06/08/2019 2328   GLUCOSEU Negative 01/03/2014 0215   HGBUR NEGATIVE 06/08/2019 2328   BILIRUBINUR NEGATIVE 06/08/2019 2328   BILIRUBINUR Negative 01/03/2014 0215   KETONESUR NEGATIVE 06/08/2019 2328    PROTEINUR NEGATIVE 06/08/2019 2328   NITRITE NEGATIVE 06/08/2019 2328   LEUKOCYTESUR NEGATIVE 06/08/2019 2328   LEUKOCYTESUR 3+ 01/03/2014 0215   Sepsis Labs: @LABRCNTIP (procalcitonin:4,lacticacidven:4)  )No results found for this or any previous visit (from the past 240 hour(s)).       Radiology Studies: CT HEAD WO CONTRAST  Result Date: 06/12/2019 CLINICAL DATA:  COVID-19 positive, severe encephalopathy EXAM: CT HEAD WITHOUT CONTRAST TECHNIQUE: Contiguous axial images were obtained from the base of the skull through the vertex without intravenous contrast. COMPARISON:  CT head Sep 12, 2017 FINDINGS: Brain: No evidence of acute infarction, hemorrhage, hydrocephalus, extra-axial collection or mass lesion/mass effect. Symmetric prominence of the ventricles, cisterns and sulci compatible with parenchymal volume loss. Mixed patchy and confluent areas of white matter hypoattenuation are most compatible with advanced chronic microvascular angiopathy. Senescent mineralization of the basal ganglia Vascular: Atherosclerotic calcification of the carotid siphons. No hyperdense vessel. Skull: No calvarial fracture or suspicious osseous lesion. No scalp swelling or hematoma. Small lentiform scalp lipoma over the left temporal region measuring up to 4 mm in maximal thickness is unchanged from priors. Sinuses/Orbits: Paranasal sinuses and mastoid air cells are predominantly clear. Orbital structures are unremarkable aside from prior lens extractions. Other: None IMPRESSION: 1. No acute intracranial findings. 2. Stable chronic microvascular angiopathy and parenchymal volume loss. Electronically Signed   By: Sep 14, 2017 M.D.   On: 06/12/2019 19:28        Scheduled Meds: . albuterol  2 puff Inhalation Q6H  . vitamin C  500 mg Oral Daily  . dexamethasone (DECADRON) injection  6 mg Intravenous Q24H  . enoxaparin (LOVENOX) injection  40 mg Subcutaneous Q24H  . feeding supplement (ENSURE ENLIVE)  237 mL Oral  BID BM  . lisinopril  20 mg Oral Daily  . mouth rinse  15 mL Mouth Rinse BID  . mirtazapine  15 mg Oral QHS  . multivitamin with minerals  1 tablet Oral Daily  . zinc sulfate  220 mg Oral Daily   Continuous Infusions:    LOS: 5 days    Time spent: 25 minutes    06/14/2019, MD [facility] 06/14/2019, 8:52 AM     Please page though [facility] or Epic secure chat:  For password, contact charge nurse

## 2019-06-14 NOTE — Progress Notes (Signed)
Physical Therapy Treatment Patient Details Name: Angel Simmons MRN: [id number] DOB: [birth year] Today's Date: 06/14/2019    History of Present Illness per MD: LARYA CHARPENTIER is a 86 y.o. female with medical history significant for dementia, who presents to the emergency room with a 1 day history of weakness, lethargy and cough.  No complaints of fever or apparent shortness of breath or chest pain.  Most of the history is provided by daughter at bedside who states that she was in her usual state of health until earlier in the day    PT Comments    Patient agrees to getting up out of the bed. She needs min guard for supine <> sit . She transfers with HHA + 1 and mod assist , and is able to transfer to the commode. She is able to transfer off the commode with mod assist . She is not able to follow commands for therapeutic exercise, verbalizing that she wants to get a spill off the floor near the garbage can. She will continue to benefit from skilled PT to improve mobility and strength.   Follow Up Recommendations  SNF     Equipment Recommendations  None recommended by PT    Recommendations for Other Services       Precautions / Restrictions Restrictions Weight Bearing Restrictions: No    Mobility  Bed Mobility Overal bed mobility: Modified Independent Bed Mobility: Supine to Sit;Sit to Supine Rolling: Min guard   Supine to sit: Min guard Sit to supine: Min guard      Transfers Overall transfer level: Needs assistance Equipment used: 1 person hand held assist Transfers: Sit to/from Stand Sit to Stand: Mod assist         General transfer comment: transfered to commode  Ambulation/Gait Ambulation/Gait assistance: (unable)               Stairs             Wheelchair Mobility    Modified Rankin (Stroke Patients Only)       Balance                                            Cognition Arousal/Alertness: Awake/alert Behavior During  Therapy: WFL for tasks assessed/performed Overall Cognitive Status: History of cognitive impairments - at baseline                                        Exercises      General Comments        Pertinent Vitals/Pain Pain Assessment: No/denies pain    Home Living                      Prior Function            PT Goals (current goals can now be found in the care plan section) Acute Rehab PT Goals Patient Stated Goal: no goals stated Progress towards PT goals: Progressing toward goals    Frequency    Min 2X/week      PT Plan Current plan remains appropriate    Co-evaluation              AM-PAC PT "6 Clicks" Mobility   Outcome Measure  Help needed turning from your back to your  side while in a flat bed without using bedrails?: A Lot Help needed moving from lying on your back to sitting on the side of a flat bed without using bedrails?: A Lot Help needed moving to and from a bed to a chair (including a wheelchair)?: A Lot Help needed standing up from a chair using your arms (e.g., wheelchair or bedside chair)?: A Lot Help needed to walk in hospital room?: A Lot Help needed climbing 3-5 steps with a railing? : A Lot 6 Click Score: 12    End of Session Equipment Utilized During Treatment: Gait belt Activity Tolerance: Patient limited by fatigue Patient left: in bed;with bed alarm set Nurse Communication: Mobility status PT Visit Diagnosis: Unsteadiness on feet (R26.81);Muscle weakness (generalized) (M62.81);Difficulty in walking, not elsewhere classified (R26.2)     Time: 1345-1410 PT Time Calculation (min) (ACUTE ONLY): 25 min  Charges:  $Therapeutic Activity: 23-37 mins                        Alanson Puls, PT DPT 06/14/2019, 3:05 PM

## 2019-06-15 LAB — COMPREHENSIVE METABOLIC PANEL
ALT: 45 U/L — ABNORMAL HIGH (ref 0–44)
AST: 43 U/L — ABNORMAL HIGH (ref 15–41)
Albumin: 3.2 g/dL — ABNORMAL LOW (ref 3.5–5.0)
Alkaline Phosphatase: 43 U/L (ref 38–126)
Anion gap: 10 (ref 5–15)
BUN: 35 mg/dL — ABNORMAL HIGH (ref 8–23)
CO2: 23 mmol/L (ref 22–32)
Calcium: 8.6 mg/dL — ABNORMAL LOW (ref 8.9–10.3)
Chloride: 106 mmol/L (ref 98–111)
Creatinine, Ser: 0.74 mg/dL (ref 0.44–1.00)
GFR calc Af Amer: >60 mL/min (ref >60)
GFR calc non Af Amer: >60 mL/min (ref >60)
Glucose, Bld: 154 mg/dL — ABNORMAL HIGH (ref 70–99)
Potassium: 3.5 mmol/L (ref 3.5–5.1)
Sodium: 139 mmol/L (ref 135–145)
Total Bilirubin: 0.9 mg/dL (ref 0.3–1.2)
Total Protein: 6.3 g/dL — ABNORMAL LOW (ref 6.5–8.1)

## 2019-06-15 LAB — CBC
HCT: 33.8 % — ABNORMAL LOW (ref 36.0–46.0)
Hemoglobin: 11.4 g/dL — ABNORMAL LOW (ref 12.0–15.0)
MCH: 32.4 pg (ref 26.0–34.0)
MCHC: 33.7 g/dL (ref 30.0–36.0)
MCV: 96 fL (ref 80.0–100.0)
Platelets: 250 10*3/uL (ref 150–400)
RBC: 3.52 MIL/uL — ABNORMAL LOW (ref 3.87–5.11)
RDW: 12.1 % (ref 11.5–15.5)
WBC: 6.9 10*3/uL (ref 4.0–10.5)
nRBC: 0 % (ref 0.0–0.2)

## 2019-06-15 NOTE — Care Management Important Message (Signed)
Important Message  Patient Details  Name: Angel Simmons MRN: [id number] Date of Birth: April 18, 1929   Medicare Important Message Given:  Yes  Reviewed verbally with daughter, June.  Requested copy of Medicare IM be mailed to patient's home address as copy sent by Patient Access has not arrived yet. Declined sending via email.      Johnell Comings 06/15/2019, 12:03 PM

## 2019-06-15 NOTE — Progress Notes (Signed)
Physical Therapy Treatment Patient Details Name: Angel Simmons MRN: [id number] DOB: 07/22/1928 Today's Date: 06/15/2019    History of Present Illness per MD: Angel Simmons is a 86 y.o. female with medical history significant for dementia, who presents to the emergency room with a 1 day history of weakness, lethargy and cough.  No complaints of fever or apparent shortness of breath or chest pain.  Most of the history is provided by daughter at bedside who states that she was in her usual state of health until earlier in the day    PT Comments    Pt lightly sleeping upon arrival.  Awoke and asking for water.  Side lying to sit with min a x 1.  Generally steady for short periods.  She is able to finish small amount of strawberry shake and some water before fatigue and laying back down.  Falls quickly back to sleep.  BP increased with activity with diastolic 60 upon arrival and 90 once back to supine.   Follow Up Recommendations  SNF     Equipment Recommendations       Recommendations for Other Services       Precautions / Restrictions Precautions Precautions: Fall Restrictions Weight Bearing Restrictions: No    Mobility  Bed Mobility Overal bed mobility: Needs Assistance Bed Mobility: Supine to Sit;Sit to Supine     Supine to sit: Min assist Sit to supine: Min assist      Transfers                 General transfer comment: deferred  Ambulation/Gait                 Stairs             Wheelchair Mobility    Modified Rankin (Stroke Patients Only)       Balance Overall balance assessment: Needs assistance Sitting-balance support: No upper extremity supported;Feet supported Sitting balance-Leahy Scale: Fair Sitting balance - Comments: fatigues quickly but generally steady for short periods                                    Cognition Arousal/Alertness: Lethargic Behavior During Therapy: WFL for tasks  assessed/performed Overall Cognitive Status: History of cognitive impairments - at baseline                                        Exercises      General Comments        Pertinent Vitals/Pain Pain Assessment: No/denies pain    Home Living                      Prior Function            PT Goals (current goals can now be found in the care plan section) Progress towards PT goals: Progressing toward goals    Frequency    Min 2X/week      PT Plan Current plan remains appropriate    Co-evaluation              AM-PAC PT "6 Clicks" Mobility   Outcome Measure  Help needed turning from your back to your side while in a flat bed without using bedrails?: A Lot Help needed moving from lying on your back to sitting on the  side of a flat bed without using bedrails?: A Lot Help needed moving to and from a bed to a chair (including a wheelchair)?: A Lot Help needed standing up from a chair using your arms (e.g., wheelchair or bedside chair)?: A Lot Help needed to walk in hospital room?: A Lot   6 Click Score: 10    End of Session Equipment Utilized During Treatment: Oxygen Activity Tolerance: Patient limited by fatigue Patient left: in bed;with bed alarm set;with call bell/phone within reach Nurse Communication: Mobility status       Time: 4492-0100 PT Time Calculation (min) (ACUTE ONLY): 10 min  Charges:  $Therapeutic Activity: 8-22 mins                    Chesley Noon, PTA 06/15/19, 3:50 PM

## 2019-06-15 NOTE — Progress Notes (Signed)
Daughter June Graham updated via telephone, requesting daily updates from physician as well, secure chat sent to MD.

## 2019-06-15 NOTE — Progress Notes (Signed)
Patient drank 100% of strawberry ensure. Will continue to encourage intake.

## 2019-06-15 NOTE — Progress Notes (Signed)
Patient ate 80% of magic cup, and drank a carton of milk and small cup of water. Continues to need encouragement and assistance with meals.

## 2019-06-15 NOTE — Progress Notes (Addendum)
PROGRESS NOTE    Angel Simmons  SLH:[id number] DOB: 1929-04-29 DOA: 06/08/2019 PCP: Baxter Hire, MD      Brief Narrative:  Angel Simmons is a 86 y.o. F with dementia who presented with weakness, lethargy and cough.    In the ER, patient had low grade temp, RR 24, CXR clear.  COVID positive.   Assessment & Plan:  Covid pneumonitis with hypoxia Covid-related acute metabolic encephalopathy Patient admitted with COVID-19 with Acute Pneumonia without respiratory failure in setting of the ongoing COVID-19 pandemic.  She was started on remdesivir and steroids, weaned quickly to room air, but remained severely encephalopathic, with very little oral intake, sometimes refusing food.  CT head ruled out stroke, she had no focal neurological findings.  Completed 5 days remdesivir.  patient was lethargic, nearly comatose.  But started to make eye contact, and mumbled answers, asking about the weather outside, and started and eat and drink when offered. -Continue dexamethasone, day 7 -Consult dietitian -Continue to prompt patient to drink fluids/Ensure every 3 hours -Nursing documentation of oral intake each day is very much appreciated -Strict intake and output  Hypertension Blood pressure slightly elevated -Continue lisinopril    Hypokalemia, resolved  Dementia  At baseline patient lives with daughter, was able to make needs known, interactive. -Hold memantine, no clinical utility in acute setting -Continue mirtazapine given appetite stimulation effects  CKD IIIa Baseline Cr ~0.9-1, stable relative to baseline  Severe protein calorie malnutrition As evidenced by BMI 18, severe loss of subcutaneous muscle mass and fat, minimal oral intake documented by nursing. -Consult dietitian -Continue Ensure   Disposition: She was admitted with COVID-19 and severe encephalopathy.  At baseline she lives with her daughter and son-in-law, ambulates with a walker, and participates in  self-cares.  Initially, the patient was nearly comatose, refusing all oral intake.  With remdesivir, IV fluids, and supportive nursing cares, the patient has improved in the last 48 hours, to the point that she is alert enough to participate in self-cares again.  Given her severe protein calorie malnutrition and dementia, with superimposed Covid encephalopathy (illness that likely will limit her oral intake for weeks, months, or permanently), the patient is at extremely high risk of failure to thrive.  This prognosis has been shared with family.  Fluids were turned off 1/30 in the evening, and she has now been off fluids for 24 hours.  I would recommend minimum 48 hours off fluids to confirm that the patient does not have worsening hypernatremia/renal insufficiency and that her oral intake is able to keep up with fluid needs, prior to discharge, at the earliest Tuesday to SNF.     MDM: The below labs and imaging reports reviewed and summarized above.  Medication management as above.       DVT prophylaxis: Lovenox Code Status: DNR Family Communication: updated Daughter/POA by phone today    Consultants:     Procedures:     Antimicrobials:   Only remdesivir  Culture data:        Subjective: Pt was not able to answer questions, but was more alert, and ate and drank when offered food and drinks.  No noted fever, N/V/D.     Objective: Vitals:   06/15/19 0416 06/15/19 0818 06/15/19 1246 06/15/19 1615  BP: 120/75 129/68 (!) 192/63 (!) 153/58  Pulse: 69 78 60 80  Resp: 17 16 18 20   Temp: 97.6 F (36.4 C) 98.6 F (37 C) (!) 97.3 F (36.3 C)   TempSrc:  Oral   SpO2: 95% 99% 98% 97%  Weight:      Height:        Intake/Output Summary (Last 24 hours) at 06/15/2019 1637 Last data filed at 06/15/2019 0925 Gross per 24 hour  Intake 100 ml  Output 250 ml  Net -150 ml   Filed Weights   06/09/19 0300 06/13/19 0456 06/14/19 0333  Weight: 48.9 kg 59.4 kg 42.5 kg     Examination: Constitutional: NAD, lethargic, arousable,  HEENT: conjunctivae and lids normal, EOMI CV: RRR no M,R,G. Distal pulses +2.  No cyanosis.   RESP: CTA B/L, normal respiratory effort  GI: +BS, NTND Extremities: No effusions, edema, or tenderness in BLE SKIN: warm, dry and intact Neuro: II - XII grossly intact.    Data Reviewed: I have personally reviewed following labs and imaging studies:  CBC: Recent Labs  Lab 06/10/19 0412 06/10/19 0412 06/11/19 0420 06/12/19 0748 06/13/19 0648 06/14/19 0640 06/15/19 0456  WBC 11.7*   < > 8.9 7.6 8.2 7.4 6.9  NEUTROABS 10.0*  --  7.4 6.3 7.1 6.3  --   HGB 13.4   < > 12.5 11.5* 13.2 12.3 11.4*  HCT 41.3   < > 37.4 33.7* 39.7 37.2 33.8*  MCV 98.3   < > 96.6 96.3 96.4 96.9 96.0  PLT 200   < > 183 182 227 252 250   < > = values in this interval not displayed.   Basic Metabolic Panel: Recent Labs  Lab 06/10/19 0412 06/10/19 0412 06/11/19 0420 06/12/19 0748 06/13/19 0648 06/14/19 0640 06/15/19 0456  NA 136   < > 136 137 140 139 139  K 2.9*   < > 3.9 3.5 3.4* 3.7 3.5  CL 96*   < > 97* 104 102 104 106  CO2 26   < > 25 24 28 25 23   GLUCOSE 148*   < > 148* 168* 155* 165* 154*  BUN 21   < > 31* 35* 31* 36* 35*  CREATININE 1.04*   < > 0.92 0.83 0.79 0.88 0.74  CALCIUM 9.2   < > 9.3 8.6* 9.0 8.8* 8.6*  MG 1.9  --   --   --   --   --   --    < > = values in this interval not displayed.   GFR: Estimated Creatinine Clearance: 32.6 mL/min (by C-G formula based on SCr of 0.74 mg/dL). Liver Function Tests: Recent Labs  Lab 06/11/19 0420 06/12/19 0748 06/13/19 0648 06/14/19 0640 06/15/19 0456  AST 65* 55* 64* 57* 43*  ALT 26 31 44 48* 45*  ALKPHOS 42 46 56 52 43  BILITOT 0.7 0.9 1.1 1.1 0.9  PROT 6.6 6.3* 7.3 6.9 6.3*  ALBUMIN 3.5 3.3* 3.6 3.5 3.2*   No results for input(s): LIPASE, AMYLASE in the last 168 hours. No results for input(s): AMMONIA in the last 168 hours. Coagulation Profile: No results for input(s):  INR, PROTIME in the last 168 hours. Cardiac Enzymes: No results for input(s): CKTOTAL, CKMB, CKMBINDEX, TROPONINI in the last 168 hours. BNP (last 3 results) No results for input(s): PROBNP in the last 8760 hours. HbA1C: No results for input(s): HGBA1C in the last 72 hours. CBG: No results for input(s): GLUCAP in the last 168 hours. Lipid Profile: No results for input(s): CHOL, HDL, LDLCALC, TRIG, CHOLHDL, LDLDIRECT in the last 72 hours. Thyroid Function Tests: No results for input(s): TSH, T4TOTAL, FREET4, T3FREE, THYROIDAB in the last 72 hours. Anemia Panel: No  results for input(s): VITAMINB12, FOLATE, FERRITIN, TIBC, IRON, RETICCTPCT in the last 72 hours. Urine analysis:    Component Value Date/Time   COLORURINE YELLOW (A) 06/08/2019 2328   APPEARANCEUR CLEAR (A) 06/08/2019 2328   APPEARANCEUR Hazy 01/03/2014 0215   LABSPEC 1.015 06/08/2019 2328   LABSPEC 1.009 01/03/2014 0215   PHURINE 5.0 06/08/2019 2328   GLUCOSEU NEGATIVE 06/08/2019 2328   GLUCOSEU Negative 01/03/2014 0215   HGBUR NEGATIVE 06/08/2019 2328   BILIRUBINUR NEGATIVE 06/08/2019 2328   BILIRUBINUR Negative 01/03/2014 0215   KETONESUR NEGATIVE 06/08/2019 2328   PROTEINUR NEGATIVE 06/08/2019 2328   NITRITE NEGATIVE 06/08/2019 2328   LEUKOCYTESUR NEGATIVE 06/08/2019 2328   LEUKOCYTESUR 3+ 01/03/2014 0215   Sepsis Labs: @LABRCNTIP (procalcitonin:4,lacticacidven:4)  )No results found for this or any previous visit (from the past 240 hour(s)).       Radiology Studies: No results found.      Scheduled Meds: . albuterol  2 puff Inhalation Q6H  . vitamin C  500 mg Oral Daily  . dexamethasone (DECADRON) injection  6 mg Intravenous Q24H  . enoxaparin (LOVENOX) injection  40 mg Subcutaneous Q24H  . feeding supplement (ENSURE ENLIVE)  237 mL Oral BID BM  . lisinopril  20 mg Oral Daily  . mouth rinse  15 mL Mouth Rinse BID  . mirtazapine  15 mg Oral QHS  . multivitamin with minerals  1 tablet Oral Daily   . zinc sulfate  220 mg Oral Daily   Continuous Infusions:    LOS: 6 days     , MD [facility] 06/15/2019, 4:37 PM     Please page though AMION or Epic secure chat:  For password, contact charge nurse

## 2019-06-15 NOTE — Progress Notes (Signed)
Patient able to stand to [facility] with 1 assist after voicing need to void this morning. She inquired about the weather, and tells this RN she is confused, but she doesn't know why. Patient able to eat with encouragement, 1/2 banana, 4 bites of vanilla magiv cup, 6 oz Vanilla ensure, 4 oz orange juice.

## 2019-06-16 LAB — BASIC METABOLIC PANEL
Anion gap: 11 (ref 5–15)
BUN: 33 mg/dL — ABNORMAL HIGH (ref 8–23)
CO2: 25 mmol/L (ref 22–32)
Calcium: 9 mg/dL (ref 8.9–10.3)
Chloride: 103 mmol/L (ref 98–111)
Creatinine, Ser: 0.79 mg/dL (ref 0.44–1.00)
GFR calc Af Amer: >60 mL/min (ref >60)
GFR calc non Af Amer: >60 mL/min (ref >60)
Glucose, Bld: 149 mg/dL — ABNORMAL HIGH (ref 70–99)
Potassium: 4 mmol/L (ref 3.5–5.1)
Sodium: 139 mmol/L (ref 135–145)

## 2019-06-16 LAB — CBC
HCT: 37.1 % (ref 36.0–46.0)
Hemoglobin: 12.3 g/dL (ref 12.0–15.0)
MCH: 31.9 pg (ref 26.0–34.0)
MCHC: 33.2 g/dL (ref 30.0–36.0)
MCV: 96.4 fL (ref 80.0–100.0)
Platelets: 286 10*3/uL (ref 150–400)
RBC: 3.85 MIL/uL — ABNORMAL LOW (ref 3.87–5.11)
RDW: 12.1 % (ref 11.5–15.5)
WBC: 8 10*3/uL (ref 4.0–10.5)
nRBC: 0 % (ref 0.0–0.2)

## 2019-06-16 LAB — MAGNESIUM: Magnesium: 2.2 mg/dL (ref 1.7–2.4)

## 2019-06-16 MED ORDER — ENSURE ENLIVE PO LIQD
237.0000 mL | Freq: Three times a day (TID) | ORAL | Status: DC
  Administered 2019-06-16 – 2019-06-17 (×4): 237 mL via ORAL

## 2019-06-16 NOTE — Progress Notes (Signed)
Patient ate about 10% lunch but drank an entire Ensure Chocolate around 1330.

## 2019-06-16 NOTE — TOC Progression Note (Signed)
Transition of Care [facility]) - Progression Note    Patient Details  Name: Angel Simmons MRN: [id number] Date of Birth: 04-Sep-1928  Transition of Care [facility]) CM/SW Contact  Allayne Butcher, RN Phone Number: 06/16/2019, 2:45 PM  Clinical Narrative:    Patient's daughter agrees to accept bed at [facility].  [facility] cannot accept patient until after 10 days from diagnosis of COVID.  [facility] may have bed available tomorrow.    Expected Discharge Plan: Skilled Nursing Facility Barriers to Discharge: Continued Medical Work up  Expected Discharge Plan and Services Expected Discharge Plan: Skilled Nursing Facility   Discharge Planning Services: CM Consult Post Acute Care Choice: Skilled Nursing Facility Living arrangements for the past 2 months: Single Family Home                                       Social Determinants of Health (SDOH) Interventions    Readmission Risk Interventions No flowsheet data found.

## 2019-06-16 NOTE — Progress Notes (Addendum)
approached pt to verify mews score. With checking of BP pt becomes nervous and fidgets with tightening of cuff. Held pt hand to provide reassurance and rechecked BP. BP reading with holding of hand 164/78. Manually counted RR of 17

## 2019-06-16 NOTE — Progress Notes (Signed)
Patient ate 10% breakfast but drank an entire Ensure Vanilla. Patient offered water frequently. No signs of aspiration observed.

## 2019-06-16 NOTE — NC FL2 (Signed)
[place] MEDICAID FL2 LEVEL OF CARE SCREENING TOOL     IDENTIFICATION  Patient Name: Angel Simmons Birthdate: 1928-05-24 Sex: female Admission Date (Current Location): 06/08/2019  [place] and [place] Number:  Chiropodist and Address:  [facility], [address] [place], [place] [address]      Provider Number: [id number]  Attending Physician Name and Address:  Darlin Priestly, MD  Relative Name and Phone Number:  June Graham - [phone]    Current Level of Care: Hospital Recommended Level of Care: Skilled Nursing Facility Prior Approval Number:    Date Approved/Denied:   PASRR Number: [id number]  Discharge Plan: SNF    Current Diagnoses: Patient Active Problem List   Diagnosis Date Noted  . Acute metabolic encephalopathy 06/09/2019  . COVID-19 virus infection 06/09/2019  . Dementia without behavioral disturbance (HCC) 06/09/2019  . Bilateral lower extremity edema 01/21/2018  . Lower extremity pain, bilateral 01/21/2018  . Acute right lumbar radiculopathy 09/19/2017  . Thigh shingles (Right) 09/19/2017  . 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 Spondylosis without myelopathy or radiculopathy, lumbosacral region 09/19/2017  . Lumbar facet syndrome (Bilateral) 09/19/2017  . Abnormal MRI, lumbar spine (12/21/2013) 09/19/2017  . Post herpetic neuralgia 09/12/2017  . Acute encephalopathy 09/12/2017  . UTI (urinary tract infection) 09/12/2017  . HTN (hypertension) 09/12/2017  . Chronic pain of multiple joints 02/06/2017  . At high risk for falls 06/08/2016  . History of fall 06/08/2016  . Major neurocognitive disorder due to multiple etiologies (HCC) 06/08/2016  . Ulcer of esophagus with bleeding 11/14/2015  . C. difficile diarrhea 11/13/2015  . Gastroesophageal reflux disease without esophagitis 11/12/2015  . Melanotic stools 11/12/2015  . Essential hypertension 11/12/2015  . S/P hip  hemiarthroplasty 07/02/2015  . Fracture of left hip requiring operative repair (HCC) 05/17/2015  . HNP (herniated nucleus pulposus), lumbar 04/26/2015  . Hip fracture (HCC) 04/04/2015  . Chronic LBP 09/25/2013  . Degeneration of intervertebral disc of lumbar region 09/25/2013  . Neuritis or radiculitis due to rupture of lumbar intervertebral disc 09/25/2013  . Degenerative arthritis of lumbar spine 09/25/2013  . Lumbar radiculitis 09/25/2013  . Chronic low back pain (Secondary Area of Pain) (Bilateral) with sciatica (Right) 09/25/2013  . DDD (degenerative disc disease), lumbar 09/25/2013  . Lumbar spondylosis 09/25/2013    Orientation RESPIRATION BLADDER Height & Weight     Self  Normal Continent Weight: 42.5 kg Height:  5' 4.02" (162.6 cm)  BEHAVIORAL SYMPTOMS/MOOD NEUROLOGICAL BOWEL NUTRITION STATUS      Continent Diet(regular diet)  AMBULATORY STATUS COMMUNICATION OF NEEDS Skin   Limited Assist Verbally Normal                       Personal Care Assistance Level of Assistance  Bathing, Feeding, Dressing Bathing Assistance: Limited assistance Feeding assistance: Limited assistance Dressing Assistance: Limited assistance     Functional Limitations Info             SPECIAL CARE FACTORS FREQUENCY  PT (By licensed PT), OT (By licensed OT)     PT Frequency: 5-7 times per week OT Frequency: 5 times per week            Contractures Contractures Info: Not present    Additional Factors Info  Code Status, Allergies Code Status Info: DNR Allergies Info: NKA           Current Medications (06/16/2019):  This is the current hospital active medication  list Current Facility-Administered Medications  Medication Dose Route Frequency Provider Last Rate Last Admin  . albuterol (VENTOLIN HFA) 108 (90 Base) MCG/ACT inhaler 2 puff  2 puff Inhalation Q6H Athena Masse, MD   2 puff at 06/16/19 1343  . ascorbic acid (VITAMIN C) tablet 500 mg  500 mg Oral Daily Athena Masse, MD   500 mg at 06/16/19 0908  . dexamethasone (DECADRON) injection 6 mg  6 mg Intravenous Q24H Paulette Blanch, MD   6 mg at 06/16/19 0108  . enoxaparin (LOVENOX) injection 40 mg  40 mg Subcutaneous Q24H Edwin Dada, MD   40 mg at 06/15/19 2009  . feeding supplement (ENSURE ENLIVE) (ENSURE ENLIVE) liquid 237 mL  237 mL Oral BID BM Danford, Suann Larry, MD   237 mL at 06/16/19 1343  . guaiFENesin-dextromethorphan (ROBITUSSIN DM) 100-10 MG/5ML syrup 10 mL  10 mL Oral Q4H PRN Athena Masse, MD      . lisinopril (ZESTRIL) tablet 20 mg  20 mg Oral Daily Edwin Dada, MD   20 mg at 06/16/19 0908  . MEDLINE mouth rinse  15 mL Mouth Rinse BID Edwin Dada, MD   15 mL at 06/16/19 0909  . mirtazapine (REMERON) tablet 15 mg  15 mg Oral QHS Edwin Dada, MD   15 mg at 06/15/19 2009  . multivitamin with minerals tablet 1 tablet  1 tablet Oral Daily Athena Masse, MD   1 tablet at 06/16/19 0908  . ondansetron (ZOFRAN) tablet 4 mg  4 mg Oral Q6H PRN Athena Masse, MD       Or  . ondansetron [facility]) injection 4 mg  4 mg Intravenous Q6H PRN Athena Masse, MD      . senna-docusate (Senokot-S) tablet 1 tablet  1 tablet Oral QHS PRN Athena Masse, MD      . zinc sulfate capsule 220 mg  220 mg Oral Daily Athena Masse, MD   220 mg at 06/16/19 0908     Discharge Medications: Please see discharge summary for a list of discharge medications.  Relevant Imaging Results:  Relevant Lab Results:   Additional Information SS# [id number]  Shelbie Hutching, RN

## 2019-06-16 NOTE — Plan of Care (Signed)
  Problem: Education: Goal: Knowledge of risk factors and measures for prevention of condition will improve Outcome: Progressing   Problem: Coping: Goal: Psychosocial and spiritual needs will be supported Outcome: Progressing   Problem: Education: Goal: Knowledge of General Education information will improve Description: Including pain rating scale, medication(s)/side effects and non-pharmacologic comfort measures Outcome: Progressing   Problem: Clinical Measurements: Goal: Ability to maintain clinical measurements within normal limits will improve Outcome: Progressing Goal: Will remain free from infection Outcome: Progressing Goal: Diagnostic test results will improve Outcome: Progressing Goal: Respiratory complications will improve Outcome: Progressing Goal: Cardiovascular complication will be avoided Outcome: Progressing   Problem: Activity: Goal: Risk for activity intolerance will decrease Outcome: Progressing   Problem: Nutrition: Goal: Adequate nutrition will be maintained Outcome: Progressing   Problem: Coping: Goal: Level of anxiety will decrease Outcome: Progressing   Problem: Elimination: Goal: Will not experience complications related to bowel motility Outcome: Progressing Goal: Will not experience complications related to urinary retention Outcome: Progressing   Problem: Pain Managment: Goal: General experience of comfort will improve Outcome: Progressing   Problem: Safety: Goal: Ability to remain free from injury will improve Outcome: Progressing   Problem: Skin Integrity: Goal: Risk for impaired skin integrity will decrease Outcome: Progressing   Problem: Respiratory: Goal: Will maintain a patent airway Outcome: Adequate for Discharge Goal: Complications related to the disease process, condition or treatment will be avoided or minimized Outcome: Adequate for Discharge   Problem: Health Behavior/Discharge Planning: Goal: Ability to manage  health-related needs will improve Outcome: Adequate for Discharge

## 2019-06-16 NOTE — Progress Notes (Signed)
Patient ate 10% fish for dinner, 25% magic cup, water and iced tea.

## 2019-06-16 NOTE — Progress Notes (Addendum)
Nutrition Follow-up  RD working remotely.  DOCUMENTATION CODES:   Underweight  INTERVENTION:  Increase to Ensure Enlive po TID, each supplement provides 350 kcal and 20 grams of protein.  Continue Magic cup TID with meals, each supplement provides 290 kcal and 9 grams of protein.   If patient can drink 3 bottles of Ensure Enlive per day + either 1 Magic Cup or a cup of milk and a portion of her meals she will be able to continue meeting calorie/protein needs.  NUTRITION DIAGNOSIS:   Increased nutrient needs related to acute illness(COVID 19) as evidenced by estimated needs.  Ongoing.  GOAL:   Patient will meet greater than or equal to 90% of their needs  Met with oral nutrition supplement intake and RN assistance/encouragement at meals.  MONITOR:   Labs, Diet advancement, Weight trends, Skin, I & O's  REASON FOR ASSESSMENT:   Consult Assessment of nutrition requirement/status  ASSESSMENT:   86 y/o female with h/o dementia, hip fracture, RA who is admitted with COVID 19  According to chart patient is more alert now. RNs have been assisting patient with meals and encouraging intake. There is also very detailed documentation of patient's intake in chart, which is greatly appreciated. She continues to have poor PO intake of meals (~10% of the actual meal) but is able to take in nutrition supplements. Last night at dinner she had 80% of Magic Cup, carton of 2% milk, and a cup of water. Today for breakfast she had 10% of her meal plus a vanilla Ensure and some water. For lunch she had 10% of her lunch plus a chocolate Ensure. In the past 24 hours she has had approximately 1207 kcal (93% minimum estimated kcal needs) and 60 grams of protein (92% minimum estimated protein needs).  Medications reviewed and include: vitamin C 500 mg daily, Decadron 6 mg Q24hrs IV, lisinopril 20 mg daily, Remeron 15 mg QHS, MVI daily, zinc sulfate 220 mg daily.  Labs reviewed: BUN 33.  Diet Order:    Diet Order            Diet regular Room service appropriate? Yes; Fluid consistency: Thin  Diet effective now             EDUCATION NEEDS:   Not appropriate for education at this time  Skin:  Skin Assessment: Reviewed RN Assessment  Last BM:  06/15/2019 small type 4  Height:   Ht Readings from Last 1 Encounters:  06/09/19 5' 4.02" (1.626 m)   Weight:   Wt Readings from Last 1 Encounters:  06/14/19 42.5 kg   Ideal Body Weight:  54.5 kg  BMI:  Body mass index is 16.06 kg/m.  Estimated Nutritional Needs:   Kcal:  1300-1500kcal/day  Protein:  65-75g/day  Fluid:  >1.2L/day  Jacklynn Barnacle, MS, RD, LDN Office: [phone] Pager: [phone] After Hours/Weekend Pager: [phone]

## 2019-06-16 NOTE — TOC Initial Note (Signed)
Transition of Care [facility]) - Initial/Assessment Note    Patient Details  Name: Angel Simmons MRN: [id number] Date of Birth: 08-21-1928  Transition of Care [facility]) CM/SW Contact:    Shelbie Hutching, RN Phone Number: 06/16/2019, 9:29 AM  Clinical Narrative:                 Patient admitted with COVID, not requiring supplemental oxygen at this time.  PT has recommended that patient go to skilled nursing facility for rehab.  Patient's daughter June is in agreement with short term rehab.  Pasrr is pending, June offered choice and will look up facilities before making a decision.  Patient will have to go outside of [facility] for a facility that accepts COVID + patients.    Expected Discharge Plan: Skilled Nursing Facility Barriers to Discharge: Continued Medical Work up   Patient Goals and CMS Choice   CMS Medicare.gov Compare Post Acute Care list provided to:: Patient Represenative (must comment) Choice offered to / list presented to : Adult Children  Expected Discharge Plan and Services Expected Discharge Plan: [place]   Discharge Planning Services: CM Consult Post Acute Care Choice: [place] Living arrangements for the past 2 months: Single Family Home                                      Prior Living Arrangements/Services Living arrangements for the past 2 months: Single Family Home Lives with:: Adult Children(daughter June) Patient language and need for interpreter reviewed:: Yes        Need for Family Participation in Patient Care: Yes (Comment) Care giver support system in place?: Yes (comment)   Criminal Activity/Legal Involvement Pertinent to Current Situation/Hospitalization: No - Comment as needed  Activities of Daily Living      Permission Sought/Granted Permission sought to share information with : Family Supports Permission granted to share information with : Yes, Verbal Permission Granted     Permission granted to  share info w AGENCY: SNF of choice  Permission granted to share info w Relationship: Daughter June     Emotional Assessment       Orientation: : Oriented to Self Alcohol / Substance Use: Not Applicable Psych Involvement: No (comment)  Admission diagnosis:  Cough [R05] Weakness [R53.1] Elevated troponin [R77.8] Generalized weakness [C12.7] Acute metabolic encephalopathy [N17.00] COVID-19 [U07.1] Patient Active Problem List   Diagnosis Date Noted  . Acute metabolic encephalopathy 17/49/4496  . COVID-19 virus infection 06/09/2019  . Dementia without behavioral disturbance ([place]) 06/09/2019  . Bilateral lower extremity edema 01/21/2018  . Lower extremity pain, bilateral 01/21/2018  . Acute right lumbar radiculopathy 09/19/2017  . Thigh shingles (Right) 09/19/2017  . 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 Spondylosis without myelopathy or radiculopathy, lumbosacral region 09/19/2017  . Lumbar facet syndrome (Bilateral) 09/19/2017  . Abnormal MRI, lumbar spine (12/21/2013) 09/19/2017  . Post herpetic neuralgia 09/12/2017  . Acute encephalopathy 09/12/2017  . UTI (urinary tract infection) 09/12/2017  . HTN (hypertension) 09/12/2017  . Chronic pain of multiple joints 02/06/2017  . At high risk for falls 06/08/2016  . History of fall 06/08/2016  . Major neurocognitive disorder due to multiple etiologies ([place]) 06/08/2016  . Ulcer of esophagus with bleeding 11/14/2015  . C. difficile diarrhea 11/13/2015  . Gastroesophageal reflux disease without esophagitis 11/12/2015  . Melanotic stools 11/12/2015  . Essential hypertension 11/12/2015  .  S/P hip hemiarthroplasty 07/02/2015  . Fracture of left hip requiring operative repair (HCC) 05/17/2015  . HNP (herniated nucleus pulposus), lumbar 04/26/2015  . Hip fracture (HCC) 04/04/2015  . Chronic LBP 09/25/2013  . Degeneration of intervertebral disc of lumbar region 09/25/2013  . Neuritis or  radiculitis due to rupture of lumbar intervertebral disc 09/25/2013  . Degenerative arthritis of lumbar spine 09/25/2013  . Lumbar radiculitis 09/25/2013  . Chronic low back pain (Secondary Area of Pain) (Bilateral) with sciatica (Right) 09/25/2013  . DDD (degenerative disc disease), lumbar 09/25/2013  . Lumbar spondylosis 09/25/2013   PCP:  Gracelyn Nurse, MD Pharmacy:   [facility] [address] [place] - [address] [place] [address] Phone: [phone] Fax: [phone]  [id number] [place], [place] - [address] AT [facility] OF [address] [facility] [address] [place] [address] Phone: [phone] Fax: [phone]     Social Determinants of Health (SDOH) Interventions    Readmission Risk Interventions No flowsheet data found.

## 2019-06-16 NOTE — NC FL2 (Signed)
[place] LEVEL OF CARE SCREENING TOOL     IDENTIFICATION  Patient Name: Angel Simmons Birthdate: Jan 09, 1929 Sex: female Admission Date (Current Location): 06/08/2019  [place] and [place] Number:  Engineering geologist and Address:  [facility], [address] [place], [place] [address]      Provider Number: [id number]  Attending Physician Name and Address:  Enzo Bi, MD  Relative Name and Phone Number:  June Graham - [phone]    Current Level of Care: Hospital Recommended Level of Care: [place] Prior Approval Number:    Date Approved/Denied:   PASRR Number: pending  Discharge Plan: SNF    Current Diagnoses: Patient Active Problem List   Diagnosis Date Noted  . Acute metabolic encephalopathy 73/22/0254  . COVID-19 virus infection 06/09/2019  . Dementia without behavioral disturbance ([place]) 06/09/2019  . Bilateral lower extremity edema 01/21/2018  . Lower extremity pain, bilateral 01/21/2018  . Acute right lumbar radiculopathy 09/19/2017  . Thigh shingles (Right) 09/19/2017  . 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 Spondylosis without myelopathy or radiculopathy, lumbosacral region 09/19/2017  . Lumbar facet syndrome (Bilateral) 09/19/2017  . Abnormal MRI, lumbar spine (12/21/2013) 09/19/2017  . Post herpetic neuralgia 09/12/2017  . Acute encephalopathy 09/12/2017  . UTI (urinary tract infection) 09/12/2017  . HTN (hypertension) 09/12/2017  . Chronic pain of multiple joints 02/06/2017  . At high risk for falls 06/08/2016  . History of fall 06/08/2016  . Major neurocognitive disorder due to multiple etiologies ([place]) 06/08/2016  . Ulcer of esophagus with bleeding 11/14/2015  . C. difficile diarrhea 11/13/2015  . Gastroesophageal reflux disease without esophagitis 11/12/2015  . Melanotic stools 11/12/2015  . Essential hypertension 11/12/2015  . S/P hip  hemiarthroplasty 07/02/2015  . Fracture of left hip requiring operative repair ([place]) 05/17/2015  . HNP (herniated nucleus pulposus), lumbar 04/26/2015  . Hip fracture ([place]) 04/04/2015  . Chronic LBP 09/25/2013  . Degeneration of intervertebral disc of lumbar region 09/25/2013  . Neuritis or radiculitis due to rupture of lumbar intervertebral disc 09/25/2013  . Degenerative arthritis of lumbar spine 09/25/2013  . Lumbar radiculitis 09/25/2013  . Chronic low back pain (Secondary Area of Pain) (Bilateral) with sciatica (Right) 09/25/2013  . DDD (degenerative disc disease), lumbar 09/25/2013  . Lumbar spondylosis 09/25/2013    Orientation RESPIRATION BLADDER Height & Weight     Self  Normal Continent Weight: 42.5 kg Height:  5' 4.02" (162.6 cm)  BEHAVIORAL SYMPTOMS/MOOD NEUROLOGICAL BOWEL NUTRITION STATUS      Continent Diet(regular diet)  AMBULATORY STATUS COMMUNICATION OF NEEDS Skin   Limited Assist Verbally Normal                       Personal Care Assistance Level of Assistance  Bathing, Feeding, Dressing Bathing Assistance: Limited assistance Feeding assistance: Limited assistance Dressing Assistance: Limited assistance     Functional Limitations Info             SPECIAL CARE FACTORS FREQUENCY  PT (By licensed PT), OT (By licensed OT)     PT Frequency: 5-7 times per week OT Frequency: 5 times per week            Contractures Contractures Info: Not present    Additional Factors Info  Code Status, Allergies Code Status Info: DNR Allergies Info: NKA           Current Medications (06/16/2019):  This is the current hospital active medication  list Current Facility-Administered Medications  Medication Dose Route Frequency Provider Last Rate Last Admin  . albuterol (VENTOLIN HFA) 108 (90 Base) MCG/ACT inhaler 2 puff  2 puff Inhalation Q6H Andris Baumann, MD   2 puff at 06/16/19 0754  . ascorbic acid (VITAMIN C) tablet 500 mg  500 mg Oral Daily Andris Baumann, MD   500 mg at 06/16/19 0908  . dexamethasone (DECADRON) injection 6 mg  6 mg Intravenous Q24H Irean Hong, MD   6 mg at 06/16/19 0108  . enoxaparin (LOVENOX) injection 40 mg  40 mg Subcutaneous Q24H Alberteen Sam, MD   40 mg at 06/15/19 2009  . feeding supplement (ENSURE ENLIVE) (ENSURE ENLIVE) liquid 237 mL  237 mL Oral BID BM Alberteen Sam, MD   237 mL at 06/16/19 0909  . guaiFENesin-dextromethorphan (ROBITUSSIN DM) 100-10 MG/5ML syrup 10 mL  10 mL Oral Q4H PRN Andris Baumann, MD      . lisinopril (ZESTRIL) tablet 20 mg  20 mg Oral Daily Alberteen Sam, MD   20 mg at 06/16/19 0908  . MEDLINE mouth rinse  15 mL Mouth Rinse BID Alberteen Sam, MD   15 mL at 06/16/19 0909  . mirtazapine (REMERON) tablet 15 mg  15 mg Oral QHS Alberteen Sam, MD   15 mg at 06/15/19 2009  . multivitamin with minerals tablet 1 tablet  1 tablet Oral Daily Andris Baumann, MD   1 tablet at 06/16/19 0908  . ondansetron (ZOFRAN) tablet 4 mg  4 mg Oral Q6H PRN Andris Baumann, MD       Or  . ondansetron [facility]) injection 4 mg  4 mg Intravenous Q6H PRN Andris Baumann, MD      . senna-docusate (Senokot-S) tablet 1 tablet  1 tablet Oral QHS PRN Andris Baumann, MD      . zinc sulfate capsule 220 mg  220 mg Oral Daily Andris Baumann, MD   220 mg at 06/16/19 0908     Discharge Medications: Please see discharge summary for a list of discharge medications.  Relevant Imaging Results:  Relevant Lab Results:   Additional Information SS# [id number]  Allayne Butcher, RN

## 2019-06-16 NOTE — Progress Notes (Signed)
PROGRESS NOTE    Angel Simmons  QQP:[id number] DOB: Feb 16, 1929 DOA: 06/08/2019 PCP: Gracelyn Nurse, MD      Brief Narrative:  Angel Simmons is a 86 y.o. F with dementia who presented with weakness, lethargy and cough.    In the ER, patient had low grade temp, RR 24, CXR clear.  COVID positive.   Assessment & Plan:  Covid pneumonitis with hypoxia, resolved Covid-related acute metabolic encephalopathy, improved Patient admitted with COVID-19 with Acute Pneumonia without respiratory failure in setting of the ongoing COVID-19 pandemic.  She was started on remdesivir and steroids, weaned quickly to room air, but remained severely encephalopathic, with very little oral intake, sometimes refusing food.  CT head ruled out stroke, she had no focal neurological findings.  Completed 5 days remdesivir.  patient was lethargic, nearly comatose.  But started to make eye contact, and mumbled answers, asking about the weather outside, and started and eat and drink when offered. -Continue dexamethasone -Consult dietitian -Continue to prompt patient to drink fluids/Ensure every 3 hours -Nursing documentation of oral intake each day is very much appreciated -Strict intake and output  Hypertension Blood pressure slightly elevated -Continue lisinopril    Hypokalemia, resolved  Dementia  At baseline patient lives with daughter, was able to make needs known, interactive. -Hold memantine, no clinical utility in acute setting -Continue mirtazapine given appetite stimulation effects  CKD IIIa Baseline Cr ~0.9-1, stable relative to baseline  Severe protein calorie malnutrition As evidenced by BMI 18, severe loss of subcutaneous muscle mass and fat, minimal oral intake documented by nursing. -Consult dietitian -Continue Ensure   Disposition: At baseline she lives with her daughter and son-in-law, ambulates with a walker, and participates in self-cares.  Initially, the patient was nearly  comatose, refusing all oral intake.  With remdesivir, IV fluids, and supportive nursing cares, the patient has improved in the last 48 hours, to the point that she is alert enough to participate in self-cares again.  Given her severe protein calorie malnutrition and dementia, with superimposed Covid encephalopathy (illness that likely will limit her oral intake for weeks, months, or permanently), the patient is at extremely high risk of failure to thrive.  This prognosis has been shared with family.  Fluids were turned off 1/30 in the evening, and pt has been able to maintain her hydration with oral intake.    1:1 tele sitter d/c'ed today in preparation for discharge to SNF rehab tomorrow.   DVT prophylaxis: Lovenox Code Status: DNR Family Communication: not today    Subjective: Pt has been getting more active and interactive.  Had been eating/drinking better when offered.  Was up in chair today.  No noted fever, N/V/D.   Objective: Vitals:   06/15/19 1615 06/15/19 2300 06/16/19 0100 06/16/19 0815  BP: (!) 153/58 (!) 202/94 (!) 164/78 (!) 156/70  Pulse: 80 85 70 80  Resp: 20 (!) 26 17 17   Temp:  98 F (36.7 C)  98.5 F (36.9 C)  TempSrc:  Oral  Oral  SpO2: 97% 98% 100% 98%  Weight:      Height:        Intake/Output Summary (Last 24 hours) at 06/16/2019 1653 Last data filed at 06/16/2019 1651 Gross per 24 hour  Intake 515 ml  Output 100 ml  Net 415 ml   Filed Weights   06/09/19 0300 06/13/19 0456 06/14/19 0333  Weight: 48.9 kg 59.4 kg 42.5 kg    Examination: Constitutional: NAD, alert, confused, up in chair HEENT:  conjunctivae and lids normal, EOMI CV: RRR no M,R,G. Distal pulses +2.  No cyanosis.   RESP: CTA B/L, normal respiratory effort  GI: +BS, NTND Extremities: No effusions, edema, or tenderness in BLE SKIN: warm, dry and intact Neuro: II - XII grossly intact.    Data Reviewed: I have personally reviewed following labs and imaging studies:  CBC: Recent  Labs  Lab 06-24-19 0412 June 24, 2019 0412 06/11/19 0420 06/11/19 0420 06/12/19 0748 06/13/19 0648 06/14/19 0640 06/15/19 0456 06/16/19 0422  WBC 11.7*   < > 8.9   < > 7.6 8.2 7.4 6.9 8.0  NEUTROABS 10.0*  --  7.4  --  6.3 7.1 6.3  --   --   HGB 13.4   < > 12.5   < > 11.5* 13.2 12.3 11.4* 12.3  HCT 41.3   < > 37.4   < > 33.7* 39.7 37.2 33.8* 37.1  MCV 98.3   < > 96.6   < > 96.3 96.4 96.9 96.0 96.4  PLT 200   < > 183   < > 182 227 252 250 286   < > = values in this interval not displayed.   Basic Metabolic Panel: Recent Labs  Lab June 24, 2019 0412 06/11/19 0420 06/12/19 0748 06/13/19 0648 06/14/19 0640 06/15/19 0456 06/16/19 0422  NA 136   < > 137 140 139 139 139  K 2.9*   < > 3.5 3.4* 3.7 3.5 4.0  CL 96*   < > 104 102 104 106 103  CO2 26   < > 24 28 25 23 25   GLUCOSE 148*   < > 168* 155* 165* 154* 149*  BUN 21   < > 35* 31* 36* 35* 33*  CREATININE 1.04*   < > 0.83 0.79 0.88 0.74 0.79  CALCIUM 9.2   < > 8.6* 9.0 8.8* 8.6* 9.0  MG 1.9  --   --   --   --   --  2.2   < > = values in this interval not displayed.   GFR: Estimated Creatinine Clearance: 32.6 mL/min (by C-G formula based on SCr of 0.79 mg/dL). Liver Function Tests: Recent Labs  Lab 06/11/19 0420 06/12/19 0748 06/13/19 0648 06/14/19 0640 06/15/19 0456  AST 65* 55* 64* 57* 43*  ALT 26 31 44 48* 45*  ALKPHOS 42 46 56 52 43  BILITOT 0.7 0.9 1.1 1.1 0.9  PROT 6.6 6.3* 7.3 6.9 6.3*  ALBUMIN 3.5 3.3* 3.6 3.5 3.2*   No results for input(s): LIPASE, AMYLASE in the last 168 hours. No results for input(s): AMMONIA in the last 168 hours. Coagulation Profile: No results for input(s): INR, PROTIME in the last 168 hours. Cardiac Enzymes: No results for input(s): CKTOTAL, CKMB, CKMBINDEX, TROPONINI in the last 168 hours. BNP (last 3 results) No results for input(s): PROBNP in the last 8760 hours. HbA1C: No results for input(s): HGBA1C in the last 72 hours. CBG: No results for input(s): GLUCAP in the last 168  hours. Lipid Profile: No results for input(s): CHOL, HDL, LDLCALC, TRIG, CHOLHDL, LDLDIRECT in the last 72 hours. Thyroid Function Tests: No results for input(s): TSH, T4TOTAL, FREET4, T3FREE, THYROIDAB in the last 72 hours. Anemia Panel: No results for input(s): VITAMINB12, FOLATE, FERRITIN, TIBC, IRON, RETICCTPCT in the last 72 hours. Urine analysis:    Component Value Date/Time   COLORURINE YELLOW (A) 06/08/2019 2328   APPEARANCEUR CLEAR (A) 06/08/2019 2328   APPEARANCEUR Hazy 01/03/2014 0215   LABSPEC 1.015 06/08/2019 2328  LABSPEC 1.009 01/03/2014 0215   PHURINE 5.0 06/08/2019 2328   GLUCOSEU NEGATIVE 06/08/2019 2328   GLUCOSEU Negative 01/03/2014 0215   HGBUR NEGATIVE 06/08/2019 [place] 06/08/2019 2328   BILIRUBINUR Negative 01/03/2014 0215   KETONESUR NEGATIVE 06/08/2019 2328   PROTEINUR NEGATIVE 06/08/2019 2328   NITRITE NEGATIVE 06/08/2019 2328   LEUKOCYTESUR NEGATIVE 06/08/2019 2328   LEUKOCYTESUR 3+ 01/03/2014 0215   Sepsis Labs: @LABRCNTIP (procalcitonin:4,lacticacidven:4)  )No results found for this or any previous visit (from the past 240 hour(s)).       Radiology Studies: No results found.      Scheduled Meds: . albuterol  2 puff Inhalation Q6H  . vitamin C  500 mg Oral Daily  . dexamethasone (DECADRON) injection  6 mg Intravenous Q24H  . enoxaparin (LOVENOX) injection  40 mg Subcutaneous Q24H  . feeding supplement (ENSURE ENLIVE)  237 mL Oral TID BM  . lisinopril  20 mg Oral Daily  . mouth rinse  15 mL Mouth Rinse BID  . mirtazapine  15 mg Oral QHS  . multivitamin with minerals  1 tablet Oral Daily  . zinc sulfate  220 mg Oral Daily   Continuous Infusions:    LOS: 7 days     Enzo Bi, MD [facility] 06/16/2019, 4:53 PM     Please page though [place] or Epic secure chat:  For password, contact charge nurse

## 2019-06-17 LAB — MAGNESIUM: Magnesium: 2.3 mg/dL (ref 1.7–2.4)

## 2019-06-17 LAB — CBC
HCT: 37.5 % (ref 36.0–46.0)
Hemoglobin: 12.6 g/dL (ref 12.0–15.0)
MCH: 32.5 pg (ref 26.0–34.0)
MCHC: 33.6 g/dL (ref 30.0–36.0)
MCV: 96.6 fL (ref 80.0–100.0)
Platelets: 309 10*3/uL (ref 150–400)
RBC: 3.88 MIL/uL (ref 3.87–5.11)
RDW: 12.2 % (ref 11.5–15.5)
WBC: 9.1 10*3/uL (ref 4.0–10.5)
nRBC: 0 % (ref 0.0–0.2)

## 2019-06-17 LAB — BASIC METABOLIC PANEL
Anion gap: 10 (ref 5–15)
BUN: 37 mg/dL — ABNORMAL HIGH (ref 8–23)
CO2: 26 mmol/L (ref 22–32)
Calcium: 9 mg/dL (ref 8.9–10.3)
Chloride: 103 mmol/L (ref 98–111)
Creatinine, Ser: 0.86 mg/dL (ref 0.44–1.00)
GFR calc Af Amer: >60 mL/min (ref >60)
GFR calc non Af Amer: >60 mL/min (ref >60)
Glucose, Bld: 149 mg/dL — ABNORMAL HIGH (ref 70–99)
Potassium: 4.1 mmol/L (ref 3.5–5.1)
Sodium: 139 mmol/L (ref 135–145)

## 2019-06-17 NOTE — TOC Progression Note (Signed)
Transition of Care [facility]) - Progression Note    Patient Details  Name: Angel Simmons MRN: [id number] Date of Birth: [birth year]  [facility]) CM/SW Contact  Allayne Butcher, RN Phone Number: 06/17/2019, 1:20 PM  Clinical Narrative:    [facility] in [place] does not have a bed available today.  Patient is on the wait list for admission.  Patient's daughter June has been updated.    Expected Discharge Plan: Skilled Nursing Facility Barriers to Discharge: Continued Medical Work up  Expected Discharge Plan and Services Expected Discharge Plan: Skilled Nursing Facility   Discharge Planning Services: CM Consult Post Acute Care Choice: Skilled Nursing Facility Living arrangements for the past 2 months: Single Family Home                                       Social Determinants of Health (SDOH) Interventions    Readmission Risk Interventions No flowsheet data found.

## 2019-06-17 NOTE — Progress Notes (Signed)
Pt eating less than half of breakfast/no lunch, less than half for dinner. Drank one full ensure for lunch, one half ensure for dinner

## 2019-06-17 NOTE — Progress Notes (Signed)
PT Cancellation Note  Patient Details Name: Angel Simmons MRN: [id number] DOB: [birth year]   Cancelled Treatment:    Reason Eval/Treat Not Completed: (Chart reviewed for attempted treatment session. Patient sleeping upon arrival to room.  Opens eyes to voice/light touch; verbalizes minimally, but speech largely unintelligible.  Attempted hand-over-hand assist to facilitate mobility, but patient withdraws from therapist assist and turns to sidelying/fetal position and closes eyes. Unable to facilitate participation further.  Will continue efforts at later time/date as medically appropriate and available.)  Smt Lokey H. Manson Passey, PT, DPT, NCS 06/17/19, 10:05 AM [phone]

## 2019-06-17 NOTE — Progress Notes (Signed)
PROGRESS NOTE    Angel Simmons  MVH:[id number] DOB: Jan 13, 1929 DOA: 06/08/2019 PCP: Gracelyn Nurse, MD      Brief Narrative:  Angel Simmons is a 86 y.o. F with dementia who presented with weakness, lethargy and cough.    In the ER, patient had low grade temp, RR 24, CXR clear.  COVID positive.   Assessment & Plan:  Covid pneumonitis with hypoxia, resolved Covid-related acute metabolic encephalopathy, improved Patient admitted with COVID-19 with Acute Pneumonia without respiratory failure in setting of the ongoing COVID-19 pandemic.  She was started on remdesivir and steroids, weaned quickly to room air, but remained severely encephalopathic, with very little oral intake, sometimes refusing food.  CT head ruled out stroke, she had no focal neurological findings.  Completed 5 days remdesivir.  patient was lethargic, nearly comatose.  But started to make eye contact, and mumbled answers, asking about the weather outside, and started and eat and drink when offered. -Continue dexamethasone -Consult dietitian -Continue to prompt patient to drink fluids/Ensure every 3 hours -Nursing documentation of oral intake each day is very much appreciated -Strict intake and output  Hypertension Blood pressure slightly elevated -Continue lisinopril    Hypokalemia, resolved  Dementia  At baseline patient lives with daughter, was able to make needs known, interactive. -Hold memantine, no clinical utility in acute setting -Continue mirtazapine given appetite stimulation effects  CKD IIIa Baseline Cr ~0.9-1, stable relative to baseline  Severe protein calorie malnutrition As evidenced by BMI 18, severe loss of subcutaneous muscle mass and fat, minimal oral intake documented by nursing. -Consult dietitian -Continue Ensure --Nursing to offer food/drink   Disposition: At baseline she lives with her daughter and son-in-law, ambulates with a walker, and participates in self-cares.  Initially,  the patient was nearly comatose, refusing all oral intake.  With remdesivir, IV fluids, and supportive nursing cares, the patient has improved in the last 48 hours, to the point that she is alert enough to participate in self-cares again.  Given her severe protein calorie malnutrition and dementia, with superimposed Covid encephalopathy (illness that likely will limit her oral intake for weeks, months, or permanently), the patient is at extremely high risk of failure to thrive.  This prognosis has been shared with family.  Fluids were turned off 1/30 in the evening, and pt has been able to maintain her hydration with oral intake.    Pt was ready for discharge today, however, no bed available from pt's chosen SNF.   DVT prophylaxis: Lovenox Code Status: DNR Family Communication: not today    Subjective: Pt continued to be confused, and could not answer questions.  Took in some drinks/foods as offered by nursing.     Objective: Vitals:   06/16/19 1700 06/16/19 2320 06/17/19 0758 06/17/19 0817  BP: 127/61 (!) 171/76 126/77   Pulse: 88 88 78   Resp: 20 (!) 23 19   Temp: 97.6 F (36.4 C) 98.3 F (36.8 C)    TempSrc: Oral Oral    SpO2: 97% 95% 96% 96%  Weight:      Height:        Intake/Output Summary (Last 24 hours) at 06/17/2019 1444 Last data filed at 06/17/2019 0900 Gross per 24 hour  Intake 175 ml  Output -  Net 175 ml   Filed Weights   06/09/19 0300 06/13/19 0456 06/14/19 0333  Weight: 48.9 kg 59.4 kg 42.5 kg    Examination: Constitutional: NAD, alert, confused, curled up in bed HEENT: conjunctivae and lids  normal, EOMI CV: RRR no M,R,G. Distal pulses +2.  No cyanosis.   RESP: CTA B/L, normal respiratory effort  GI: +BS, NTND Extremities: No effusions, edema, or tenderness in BLE SKIN: warm, dry and intact Neuro: II - XII grossly intact.    Data Reviewed: I have personally reviewed following labs and imaging studies:  CBC: Recent Labs  Lab 06/11/19 0420  06/11/19 0420 06/12/19 0748 06/12/19 0748 06/13/19 0648 06/14/19 0640 06/15/19 0456 06/16/19 0422 06/17/19 0432  WBC 8.9   < > 7.6   < > 8.2 7.4 6.9 8.0 9.1  NEUTROABS 7.4  --  6.3  --  7.1 6.3  --   --   --   HGB 12.5   < > 11.5*   < > 13.2 12.3 11.4* 12.3 12.6  HCT 37.4   < > 33.7*   < > 39.7 37.2 33.8* 37.1 37.5  MCV 96.6   < > 96.3   < > 96.4 96.9 96.0 96.4 96.6  PLT 183   < > 182   < > 227 252 250 286 309   < > = values in this interval not displayed.   Basic Metabolic Panel: Recent Labs  Lab 06/13/19 0648 06/14/19 0640 06/15/19 0456 06/16/19 0422 06/17/19 0432  NA 140 139 139 139 139  K 3.4* 3.7 3.5 4.0 4.1  CL 102 104 106 103 103  CO2 28 25 23 25 26   GLUCOSE 155* 165* 154* 149* 149*  BUN 31* 36* 35* 33* 37*  CREATININE 0.79 0.88 0.74 0.79 0.86  CALCIUM 9.0 8.8* 8.6* 9.0 9.0  MG  --   --   --  2.2 2.3   GFR: Estimated Creatinine Clearance: 30.3 mL/min (by C-G formula based on SCr of 0.86 mg/dL). Liver Function Tests: Recent Labs  Lab 06/11/19 0420 06/12/19 0748 06/13/19 0648 06/14/19 0640 06/15/19 0456  AST 65* 55* 64* 57* 43*  ALT 26 31 44 48* 45*  ALKPHOS 42 46 56 52 43  BILITOT 0.7 0.9 1.1 1.1 0.9  PROT 6.6 6.3* 7.3 6.9 6.3*  ALBUMIN 3.5 3.3* 3.6 3.5 3.2*   No results for input(s): LIPASE, AMYLASE in the last 168 hours. No results for input(s): AMMONIA in the last 168 hours. Coagulation Profile: No results for input(s): INR, PROTIME in the last 168 hours. Cardiac Enzymes: No results for input(s): CKTOTAL, CKMB, CKMBINDEX, TROPONINI in the last 168 hours. BNP (last 3 results) No results for input(s): PROBNP in the last 8760 hours. HbA1C: No results for input(s): HGBA1C in the last 72 hours. CBG: No results for input(s): GLUCAP in the last 168 hours. Lipid Profile: No results for input(s): CHOL, HDL, LDLCALC, TRIG, CHOLHDL, LDLDIRECT in the last 72 hours. Thyroid Function Tests: No results for input(s): TSH, T4TOTAL, FREET4, T3FREE, THYROIDAB  in the last 72 hours. Anemia Panel: No results for input(s): VITAMINB12, FOLATE, FERRITIN, TIBC, IRON, RETICCTPCT in the last 72 hours. Urine analysis:    Component Value Date/Time   COLORURINE YELLOW (A) 06/08/2019 2328   APPEARANCEUR CLEAR (A) 06/08/2019 2328   APPEARANCEUR Hazy 01/03/2014 0215   LABSPEC 1.015 06/08/2019 2328   LABSPEC 1.009 01/03/2014 0215   PHURINE 5.0 06/08/2019 2328   GLUCOSEU NEGATIVE 06/08/2019 2328   GLUCOSEU Negative 01/03/2014 0215   HGBUR NEGATIVE 06/08/2019 [place] 06/08/2019 2328   BILIRUBINUR Negative 01/03/2014 0215   KETONESUR NEGATIVE 06/08/2019 2328   PROTEINUR NEGATIVE 06/08/2019 2328   NITRITE NEGATIVE 06/08/2019 2328   LEUKOCYTESUR NEGATIVE  06/08/2019 2328   LEUKOCYTESUR 3+ 01/03/2014 0215   Sepsis Labs: @LABRCNTIP (procalcitonin:4,lacticacidven:4)  )No results found for this or any previous visit (from the past 240 hour(s)).       Radiology Studies: No results found.      Scheduled Meds: . albuterol  2 puff Inhalation Q6H  . vitamin C  500 mg Oral Daily  . dexamethasone (DECADRON) injection  6 mg Intravenous Q24H  . enoxaparin (LOVENOX) injection  40 mg Subcutaneous Q24H  . feeding supplement (ENSURE ENLIVE)  237 mL Oral TID BM  . lisinopril  20 mg Oral Daily  . mouth rinse  15 mL Mouth Rinse BID  . mirtazapine  15 mg Oral QHS  . multivitamin with minerals  1 tablet Oral Daily  . zinc sulfate  220 mg Oral Daily   Continuous Infusions:    LOS: 8 days     , MD [facility] 06/17/2019, 2:44 PM     Please page though AMION or Epic secure chat:  For password, contact charge nurse

## 2019-06-18 LAB — CBC
HCT: 40.2 % (ref 36.0–46.0)
Hemoglobin: 13.2 g/dL (ref 12.0–15.0)
MCH: 31.9 pg (ref 26.0–34.0)
MCHC: 32.8 g/dL (ref 30.0–36.0)
MCV: 97.1 fL (ref 80.0–100.0)
Platelets: 348 10*3/uL (ref 150–400)
RBC: 4.14 MIL/uL (ref 3.87–5.11)
RDW: 12.2 % (ref 11.5–15.5)
WBC: 9 10*3/uL (ref 4.0–10.5)
nRBC: 0 % (ref 0.0–0.2)

## 2019-06-18 LAB — BASIC METABOLIC PANEL
Anion gap: 8 (ref 5–15)
BUN: 37 mg/dL — ABNORMAL HIGH (ref 8–23)
CO2: 29 mmol/L (ref 22–32)
Calcium: 9.3 mg/dL (ref 8.9–10.3)
Chloride: 102 mmol/L (ref 98–111)
Creatinine, Ser: 0.9 mg/dL (ref 0.44–1.00)
GFR calc Af Amer: >60 mL/min (ref >60)
GFR calc non Af Amer: 57 mL/min — ABNORMAL LOW (ref >60)
Glucose, Bld: 117 mg/dL — ABNORMAL HIGH (ref 70–99)
Potassium: 3.8 mmol/L (ref 3.5–5.1)
Sodium: 139 mmol/L (ref 135–145)

## 2019-06-18 LAB — MAGNESIUM: Magnesium: 2.3 mg/dL (ref 1.7–2.4)

## 2019-06-18 MED ORDER — ENOXAPARIN SODIUM 30 MG/0.3ML ~~LOC~~ SOLN: 30.0000 mg | SUBCUTANEOUS | Status: DC

## 2019-06-18 MED ORDER — ACETAMINOPHEN 325 MG PO TABS: 650.0000 mg | ORAL_TABLET | Freq: Two times a day (BID) | ORAL | Status: DC | PRN

## 2019-06-18 MED ORDER — ENSURE ENLIVE PO LIQD: 237.0000 mL | Freq: Three times a day (TID) | ORAL | 12 refills | Status: AC

## 2019-06-18 NOTE — TOC Progression Note (Signed)
Transition of Care [facility]) - Progression Note    Patient Details  Name: Angel Simmons MRN: [id number] Date of Birth: [birth year]  [facility] CM/SW Contact  Trenton Founds, RN Phone Number: 06/18/2019, 2:13 PM  Clinical Narrative: RNCM spoke with French Ana at [facility] to see if patient could be transported today. French Ana reports that they have a certain number of people that they can accept daily and right now they are at that number. She reports that things are always changing so if anything changes in the next couple of hours she will call otherwise it may be tomorrow.       Expected Discharge Plan: Skilled Nursing Facility Barriers to Discharge: Continued Medical Work up  Expected Discharge Plan and Services Expected Discharge Plan: Skilled Nursing Facility   Discharge Planning Services: CM Consult Post Acute Care Choice: Skilled Nursing Facility Living arrangements for the past 2 months: Single Family Home                                       Social Determinants of Health (SDOH) Interventions    Readmission Risk Interventions No flowsheet data found.

## 2019-06-18 NOTE — Progress Notes (Signed)
PT Cancellation Note  Patient Details Name: Angel Simmons MRN: [id number] DOB: 07/19/1928   Cancelled Treatment:    Reason Eval/Treat Not Completed: Patient's level of consciousness   Session attempted this AM.  Pt remains lethargic awaking briefly once during attempt.  She had taken off most of her gown and was inc urine.  Gown fixed and pads changed.  She remains curled up on right side and does not assist with activity, moaning at times.  Of note, monitor alarm going off for apnea but vitals all within normal limits. Session held.   Danielle Dess 06/18/2019, 10:30 AM

## 2019-06-18 NOTE — Progress Notes (Signed)
PHARMACIST - PHYSICIAN COMMUNICATION  CONCERNING:  Enoxaparin (Lovenox) for DVT Prophylaxis   RECOMMENDATION: Patient was prescribed enoxaprin 40mg  q24 hours for VTE prophylaxis.   Filed Weights   06/09/19 0300 06/13/19 0456 06/14/19 0333  Weight: 107 lb 12.9 oz (48.9 kg) 131 lb (59.4 kg) 93 lb 9.6 oz (42.5 kg)    Body mass index is 16.06 kg/m.  Estimated Creatinine Clearance: 29 mL/min (by C-G formula based on SCr of 0.9 mg/dL).   Patient is candidate for enoxaparin 30mg  every 24 hours based on CrCl <27ml/min and  Weight less then 45kg   DESCRIPTION: Patient's weight updated in Epic, 59kg down to 42.5kg. Calculated CrCl now <46ml/min.   Pharmacy has adjusted enoxaparin dose per [facility] policy.  Patient is now receiving enoxaparin 30mg  every 24 hours.  31m, PharmD, BCPS Clinical Pharmacist 06/18/2019 8:10 AM

## 2019-06-18 NOTE — Care Management Important Message (Signed)
Important Message  Patient Details  Name: Angel Simmons MRN: [id number] Date of Birth: 03/23/1929   Medicare Important Message Given:  Yes RNCM placed call to patient's daughter June to discuss important message, she verbalizes understanding and that they are very well aware of the message.      Trenton Founds, RN 06/18/2019, 9:20 AM

## 2019-06-18 NOTE — Plan of Care (Signed)
Pt refused breakfast but ate 25% of lunch. She drank a whole cup of water and half of the sweet tea.

## 2019-06-18 NOTE — Discharge Summary (Signed)
Physician Discharge Summary   Angel Simmons  female DOB: Feb 05, 1929  FMB:[id number]  PCP: Baxter Hire, MD  Admit date: 06/08/2019 Discharge date: 06/18/2019  Admitted From: home Disposition:  SNF CODE STATUS: DNR  Discharge Instructions    Diet - low sodium heart healthy   Complete by: As directed    Increase activity slowly   Complete by: As directed        Hospital Course:  For full details, please see H&P, progress notes, consult notes and ancillary notes.  Briefly,  Angel Simmons is a 86 y.o. Caucasian F with dementia who presented with weakness, lethargy and cough.    In the ER, patient had low grade temp, RR 24, CXR clear.  COVID positive.  # Covid pneumonitis with hypoxia, resolved # Covid-related acute metabolic encephalopathy, improved She was started on remdesivir and steroids, weaned quickly to room air, but was severely encephalopathic, with very little oral intake, sometimes refusing food.  CT head ruled out stroke, she had no focal neurological findings.  Pt completed 5 days remdesivir and 10 days of Decadron.  Several days prior to discharge, pt started accepting foods and drinks offered by nursing, and was able to transfer and up to chair with assist.  Pt was not back to baseline which was ambulating with a walker and participating in self-care.  Family wished for short-term rehab with the hopes of her being able to go home afterwards.  Severe protein calorie malnutrition As evidenced by BMI 18, severe loss of subcutaneous muscle mass and fat, reduced oral intake documented by nursing.  Pt however had started accepting foods and drinks as offered.  Pt would need assistance in frequent small-amount hydration and feedings.  Pt should consume at least 3 Ensures per day, if not eating full meals.  Hypertension Continued lisinopril    Hypokalemia, resolved Repleted, and has remained stable.  Dementia  At baseline patient lives with daughter, was able to  make needs known, interactive. Memantine held while inpatient, but resumed at discharge.  Continued mirtazapine given appetite stimulation effects  CKD IIIa, stable Baseline Cr ~0.9-1   Discharge Diagnoses:  Active Problems:   HTN (hypertension)   Acute metabolic encephalopathy   COVID-19 virus infection   Dementia without behavioral disturbance [facility])    Discharge Instructions:  Allergies as of 06/18/2019   No Active Allergies     Medication List    STOP taking these medications   lidocaine 5 % Commonly known as: LIDODERM   oxyCODONE 5 MG immediate release tablet Commonly known as: Oxy IR/ROXICODONE   predniSONE 5 MG tablet Commonly known as: DELTASONE   traZODone 50 MG tablet Commonly known as: DESYREL     TAKE these medications   acetaminophen 325 MG tablet Commonly known as: TYLENOL Take 2 tablets (650 mg total) by mouth 2 (two) times daily as needed. At 1400 and 2200. What changed:   when to take this  reasons to take this   calcium carbonate 1500 (600 Ca) MG Tabs tablet Commonly known as: OSCAL Take 1,500 mg by mouth 2 (two) times daily with a meal.   cholecalciferol 25 MCG (1000 UNIT) tablet Commonly known as: VITAMIN D Take 1,000 Units by mouth daily.   DSS 100 MG Caps Take 200 mg by mouth 2 (two) times daily.   DULoxetine 30 MG capsule Commonly known as: CYMBALTA Take 30 mg by mouth daily.   feeding supplement (ENSURE ENLIVE) Liqd Take 237 mLs by mouth 3 (three)  times daily between meals.   gabapentin 300 MG capsule Commonly known as: NEURONTIN TK ONE C PO BID   lisinopril 30 MG tablet Commonly known as: ZESTRIL Take 30 mg by mouth daily.   memantine 10 MG tablet Commonly known as: NAMENDA Take 10 mg by mouth 2 (two) times daily.   mirtazapine 7.5 MG tablet Commonly known as: REMERON Take 7.5 mg by mouth at bedtime.   multivitamin capsule Take 1 capsule by mouth daily.   pantoprazole 40 MG tablet Commonly known as:  PROTONIX Take 40 mg by mouth daily.   PRESERVISION AREDS 2+MULTI VIT PO Take 1 tablet by mouth 2 (two) times daily.   vitamin B-12 1000 MCG tablet Commonly known as: CYANOCOBALAMIN Take 1,000 mcg by mouth daily.   vitamin C 500 MG tablet Commonly known as: ASCORBIC ACID Take 1,000 mg by mouth daily.   Zinc 50 MG Caps Take 50 mg by mouth daily.        Contact information for follow-up providers    Gracelyn Nurse, MD. Schedule an appointment as soon as possible for a visit in 1 week(s).   Specialty: Internal Medicine Contact information: [address] [place] [address] [phone]            Contact information for after-discharge care    Destination    [facility] .   Service: Skilled Nursing Contact information: [address] [place] [address] [phone]                  No Active Allergies   The results of significant diagnostics from this hospitalization (including imaging, microbiology, ancillary and laboratory) are listed below for reference.   Consultations:   Procedures/Studies: DG Chest 1 View  Result Date: 06/08/2019 CLINICAL DATA:  Weakness, cough EXAM: CHEST  1 VIEW COMPARISON:  09/12/2017 FINDINGS: Cardiomegaly. Dense mitral valve annular calcifications. No confluent airspace opacities or effusions. No acute bony abnormality. IMPRESSION: Cardiomegaly.  No acute cardiopulmonary disease. Electronically Signed   By: Charlett Nose M.D.   On: 06/08/2019 23:40   CT HEAD WO CONTRAST  Result Date: 06/12/2019 CLINICAL DATA:  COVID-19 positive, severe encephalopathy EXAM: CT HEAD WITHOUT CONTRAST TECHNIQUE: Contiguous axial images were obtained from the base of the skull through the vertex without intravenous contrast. COMPARISON:  CT head Sep 12, 2017 FINDINGS: Brain: No evidence of acute infarction, hemorrhage, hydrocephalus, extra-axial collection or mass lesion/mass effect. Symmetric  prominence of the ventricles, cisterns and sulci compatible with parenchymal volume loss. Mixed patchy and confluent areas of white matter hypoattenuation are most compatible with advanced chronic microvascular angiopathy. Senescent mineralization of the basal ganglia Vascular: Atherosclerotic calcification of the carotid siphons. No hyperdense vessel. Skull: No calvarial fracture or suspicious osseous lesion. No scalp swelling or hematoma. Small lentiform scalp lipoma over the left temporal region measuring up to 4 mm in maximal thickness is unchanged from priors. Sinuses/Orbits: Paranasal sinuses and mastoid air cells are predominantly clear. Orbital structures are unremarkable aside from prior lens extractions. Other: None IMPRESSION: 1. No acute intracranial findings. 2. Stable chronic microvascular angiopathy and parenchymal volume loss. Electronically Signed   By: Kreg Shropshire M.D.   On: 06/12/2019 19:28      Labs: BNP (last 3 results) No results for input(s): BNP in the last 8760 hours. Basic Metabolic Panel: Recent Labs  Lab 06/14/19 0640 06/15/19 0456 06/16/19 0422 06/17/19 0432 06/18/19 0348  NA 139 139 139 139 139  K 3.7 3.5 4.0  4.1 3.8  CL 104 106 103 103 102  CO2 25 23 25 26 29   GLUCOSE 165* 154* 149* 149* 117*  BUN 36* 35* 33* 37* 37*  CREATININE 0.88 0.74 0.79 0.86 0.90  CALCIUM 8.8* 8.6* 9.0 9.0 9.3  MG  --   --  2.2 2.3 2.3   Liver Function Tests: Recent Labs  Lab 06/12/19 0748 06/13/19 0648 06/14/19 0640 06/15/19 0456  AST 55* 64* 57* 43*  ALT 31 44 48* 45*  ALKPHOS 46 56 52 43  BILITOT 0.9 1.1 1.1 0.9  PROT 6.3* 7.3 6.9 6.3*  ALBUMIN 3.3* 3.6 3.5 3.2*   No results for input(s): LIPASE, AMYLASE in the last 168 hours. No results for input(s): AMMONIA in the last 168 hours. CBC: Recent Labs  Lab 06/12/19 0748 06/12/19 0748 06/13/19 06/15/19 06/13/19 0648 06/14/19 0640 06/15/19 0456 06/16/19 0422 06/17/19 0432 06/18/19 0348  WBC 7.6   < > 8.2   < > 7.4  6.9 8.0 9.1 9.0  NEUTROABS 6.3  --  7.1  --  6.3  --   --   --   --   HGB 11.5*   < > 13.2   < > 12.3 11.4* 12.3 12.6 13.2  HCT 33.7*   < > 39.7   < > 37.2 33.8* 37.1 37.5 40.2  MCV 96.3   < > 96.4   < > 96.9 96.0 96.4 96.6 97.1  PLT 182   < > 227   < > 252 250 286 309 348   < > = values in this interval not displayed.   Cardiac Enzymes: No results for input(s): CKTOTAL, CKMB, CKMBINDEX, TROPONINI in the last 168 hours. BNP: Invalid input(s): POCBNP CBG: No results for input(s): GLUCAP in the last 168 hours. D-Dimer No results for input(s): DDIMER in the last 72 hours. Hgb A1c No results for input(s): HGBA1C in the last 72 hours. Lipid Profile No results for input(s): CHOL, HDL, LDLCALC, TRIG, CHOLHDL, LDLDIRECT in the last 72 hours. Thyroid function studies No results for input(s): TSH, T4TOTAL, T3FREE, THYROIDAB in the last 72 hours.  Invalid input(s): FREET3 Anemia work up No results for input(s): VITAMINB12, FOLATE, FERRITIN, TIBC, IRON, RETICCTPCT in the last 72 hours. Urinalysis    Component Value Date/Time   COLORURINE YELLOW (A) 06/08/2019 2328   APPEARANCEUR CLEAR (A) 06/08/2019 2328   APPEARANCEUR Hazy 01/03/2014 0215   LABSPEC 1.015 06/08/2019 2328   LABSPEC 1.009 01/03/2014 0215   PHURINE 5.0 06/08/2019 2328   GLUCOSEU NEGATIVE 06/08/2019 2328   GLUCOSEU Negative 01/03/2014 0215   HGBUR NEGATIVE 06/08/2019 2328   BILIRUBINUR NEGATIVE 06/08/2019 2328   BILIRUBINUR Negative 01/03/2014 0215   KETONESUR NEGATIVE 06/08/2019 2328   PROTEINUR NEGATIVE 06/08/2019 2328   NITRITE NEGATIVE 06/08/2019 2328   LEUKOCYTESUR NEGATIVE 06/08/2019 2328   LEUKOCYTESUR 3+ 01/03/2014 0215   Sepsis Labs Invalid input(s): PROCALCITONIN,  WBC,  LACTICIDVEN Microbiology No results found for this or any previous visit (from the past 240 hour(s)).   Total time spend on discharging this patient, including the last patient exam, discussing the hospital stay, instructions for ongoing  care as it relates to all pertinent caregivers, as well as preparing the medical discharge records, prescriptions, and/or referrals as applicable, is 30 minutes.    01/05/2014, MD  [facility] 06/18/2019, 4:13 PM  If 7PM-7AM, please contact night-coverage

## 2019-10-17 ENCOUNTER — Other Ambulatory Visit: Payer: Self-pay | Admitting: Internal Medicine

## 2020-08-20 ENCOUNTER — Emergency Department: Payer: Medicare Other

## 2020-08-20 ENCOUNTER — Emergency Department
Admission: EM | Admit: 2020-08-20 | Discharge: 2020-08-20 | Disposition: A | Discharge status: Home / Self Care | Payer: Medicare Other | Attending: Emergency Medicine | Admitting: Emergency Medicine

## 2020-08-20 ENCOUNTER — Other Ambulatory Visit: Payer: Self-pay

## 2020-08-20 DIAGNOSIS — Z96642 Presence of left artificial hip joint: Secondary | ICD-10-CM | POA: Diagnosis not present

## 2020-08-20 DIAGNOSIS — W19XXXA Unspecified fall, initial encounter: Secondary | ICD-10-CM | POA: Insufficient documentation

## 2020-08-20 DIAGNOSIS — Z8616 Personal history of COVID-19: Secondary | ICD-10-CM | POA: Diagnosis not present

## 2020-08-20 DIAGNOSIS — I10 Essential (primary) hypertension: Secondary | ICD-10-CM | POA: Insufficient documentation

## 2020-08-20 DIAGNOSIS — Y92009 Unspecified place in unspecified non-institutional (private) residence as the place of occurrence of the external cause: Secondary | ICD-10-CM | POA: Insufficient documentation

## 2020-08-20 DIAGNOSIS — Z79899 Other long term (current) drug therapy: Secondary | ICD-10-CM | POA: Insufficient documentation

## 2020-08-20 DIAGNOSIS — M25511 Pain in right shoulder: Secondary | ICD-10-CM | POA: Diagnosis present

## 2020-08-20 DIAGNOSIS — M79661 Pain in right lower leg: Secondary | ICD-10-CM | POA: Diagnosis not present

## 2020-08-20 DIAGNOSIS — F0390 Unspecified dementia without behavioral disturbance: Secondary | ICD-10-CM | POA: Insufficient documentation

## 2020-08-20 LAB — URINALYSIS, COMPLETE (UACMP) WITH MICROSCOPIC
Bacteria, UA: NONE SEEN
Bilirubin Urine: NEGATIVE
Glucose, UA: NEGATIVE mg/dL
Hgb urine dipstick: NEGATIVE
Ketones, ur: NEGATIVE mg/dL
Leukocytes,Ua: NEGATIVE
Nitrite: NEGATIVE
Protein, ur: NEGATIVE mg/dL
Specific Gravity, Urine: 1.005 (ref 1.005–1.030)
pH: 7 (ref 5.0–8.0)

## 2020-08-20 LAB — COMPREHENSIVE METABOLIC PANEL
ALT: 19 U/L (ref 0–44)
AST: 27 U/L (ref 15–41)
Albumin: 3.6 g/dL (ref 3.5–5.0)
Alkaline Phosphatase: 65 U/L (ref 38–126)
Anion gap: 6 (ref 5–15)
BUN: 14 mg/dL (ref 8–23)
CO2: 29 mmol/L (ref 22–32)
Calcium: 9.4 mg/dL (ref 8.9–10.3)
Chloride: 100 mmol/L (ref 98–111)
Creatinine, Ser: 1.07 mg/dL — ABNORMAL HIGH (ref 0.44–1.00)
GFR, Estimated: 49 mL/min — ABNORMAL LOW (ref >60)
Glucose, Bld: 97 mg/dL (ref 70–99)
Potassium: 3.7 mmol/L (ref 3.5–5.1)
Sodium: 135 mmol/L (ref 135–145)
Total Bilirubin: 0.6 mg/dL (ref 0.3–1.2)
Total Protein: 6.5 g/dL (ref 6.5–8.1)

## 2020-08-20 LAB — CBC WITH DIFFERENTIAL/PLATELET
Abs Immature Granulocytes: 0.01 10*3/uL (ref 0.00–0.07)
Basophils Absolute: 0 10*3/uL (ref 0.0–0.1)
Basophils Relative: 1 %
Eosinophils Absolute: 0.1 10*3/uL (ref 0.0–0.5)
Eosinophils Relative: 2 %
HCT: 35.5 % — ABNORMAL LOW (ref 36.0–46.0)
Hemoglobin: 11.9 g/dL — ABNORMAL LOW (ref 12.0–15.0)
Immature Granulocytes: 0 %
Lymphocytes Relative: 23 %
Lymphs Abs: 1.4 10*3/uL (ref 0.7–4.0)
MCH: 32.7 pg (ref 26.0–34.0)
MCHC: 33.5 g/dL (ref 30.0–36.0)
MCV: 97.5 fL (ref 80.0–100.0)
Monocytes Absolute: 0.9 10*3/uL (ref 0.1–1.0)
Monocytes Relative: 14 %
Neutro Abs: 3.8 10*3/uL (ref 1.7–7.7)
Neutrophils Relative %: 60 %
Platelets: 210 10*3/uL (ref 150–400)
RBC: 3.64 MIL/uL — ABNORMAL LOW (ref 3.87–5.11)
RDW: 11.8 % (ref 11.5–15.5)
WBC: 6.2 10*3/uL (ref 4.0–10.5)
nRBC: 0 % (ref 0.0–0.2)

## 2020-08-20 MED ORDER — ACETAMINOPHEN 500 MG PO TABS
1000.0000 mg | ORAL_TABLET | Freq: Once | ORAL | Status: AC
  Administered 2020-08-20: 1000 mg via ORAL
  Filled 2020-08-20: qty 2

## 2020-08-20 NOTE — ED Provider Notes (Signed)
[facility] Emergency Department Provider Note  ____________________________________________  Time seen: Approximately 6:20 AM  I have reviewed the triage vital signs and the nursing notes.   HISTORY  Chief Complaint Fall  Level 5 caveat:  Portions of the history and physical were unable to be obtained due to dementia   HPI Angel Simmons is a 87 y.o. female with history of Alzheimer's, rheumatoid arthritis, hypertension, falls who presents from her nursing home for unwitnessed fall.  Patient does not remember falling.  She is not on blood thinners.  She is complaining of right shoulder pain from the fall.  Denies headache, neck pain, any changes to her chronic back pain, chest pain, shortness of breath.  Daughter is at bedside and reports that patient is at her baseline.  She tells me that patient saw her PCP recently and was told she was dehydrated.  The nursing home has been pushing fluids over the last week.   Past Medical History:  Diagnosis Date  . Chronic back pain   . Hip fx (HCC)   . Hypertension   . Rheumatoid arthritis (HCC)   . Spinal stenosis     Patient Active Problem List   Diagnosis Date Noted  . Acute metabolic encephalopathy 06/09/2019  . COVID-19 virus infection 06/09/2019  . Dementia without behavioral disturbance (HCC) 06/09/2019  . Bilateral lower extremity edema 01/21/2018  . Lower extremity pain, bilateral 01/21/2018  . Acute right lumbar radiculopathy 09/19/2017  . Thigh shingles (Right) 09/19/2017  . 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 Spondylosis without myelopathy or radiculopathy, lumbosacral region 09/19/2017  . Lumbar facet syndrome (Bilateral) 09/19/2017  . Abnormal MRI, lumbar spine (12/21/2013) 09/19/2017  . Post herpetic neuralgia 09/12/2017  . Acute encephalopathy 09/12/2017  . UTI (urinary tract infection) 09/12/2017  . HTN (hypertension) 09/12/2017  . Chronic pain  of multiple joints 02/06/2017  . At high risk for falls 06/08/2016  . History of fall 06/08/2016  . Major neurocognitive disorder due to multiple etiologies (HCC) 06/08/2016  . Ulcer of esophagus with bleeding 11/14/2015  . C. difficile diarrhea 11/13/2015  . Gastroesophageal reflux disease without esophagitis 11/12/2015  . Melanotic stools 11/12/2015  . Essential hypertension 11/12/2015  . S/P hip hemiarthroplasty 07/02/2015  . Fracture of left hip requiring operative repair (HCC) 05/17/2015  . HNP (herniated nucleus pulposus), lumbar 04/26/2015  . Hip fracture (HCC) 04/04/2015  . Chronic LBP 09/25/2013  . Degeneration of intervertebral disc of lumbar region 09/25/2013  . Neuritis or radiculitis due to rupture of lumbar intervertebral disc 09/25/2013  . Degenerative arthritis of lumbar spine 09/25/2013  . Lumbar radiculitis 09/25/2013  . Chronic low back pain (Secondary Area of Pain) (Bilateral) with sciatica (Right) 09/25/2013  . DDD (degenerative disc disease), lumbar 09/25/2013  . Lumbar spondylosis 09/25/2013    Past Surgical History:  Procedure Laterality Date  . CATARACT EXTRACTION, BILATERAL    . CHOLECYSTECTOMY    . HEMORRHOID SURGERY    . HIP ARTHROPLASTY Left 04/05/2015   Procedure: ARTHROPLASTY BIPOLAR HIP (HEMIARTHROPLASTY);  Surgeon: Donato Heinz, MD;  Location: [facility] ORS;  Service: Orthopedics;  Laterality: Left;  . Percutaneous pinning of a right femoral neck fracture      Prior to Admission medications   Medication Sig Start Date End Date Taking? Authorizing Provider  acetaminophen (TYLENOL) 325 MG tablet Take 2 tablets (650 mg total) by mouth 2 (two) times daily as needed. At 1400 and 2200. 06/18/19   Darlin Priestly,  MD  calcium carbonate (OSCAL) 1500 (600 Ca) MG TABS tablet Take 1,500 mg by mouth 2 (two) times daily with a meal.    [provider]  cholecalciferol (VITAMIN D) 25 MCG (1000 UNIT) tablet Take 1,000 Units by mouth daily.     [provider]  Docusate Sodium (DSS) 100 MG CAPS Take 200 mg by mouth 2 (two) times daily.  11/15/15   [provider]  DULoxetine (CYMBALTA) 30 MG capsule Take 30 mg by mouth daily.    [provider]  feeding supplement, ENSURE ENLIVE, (ENSURE ENLIVE) LIQD Take 237 mLs by mouth 3 (three) times daily between meals. 06/18/19   Darlin Priestly, MD  gabapentin (NEURONTIN) 300 MG capsule TK ONE C PO BID 11/20/17   [provider]  lisinopril (ZESTRIL) 20 MG tablet TAKE 1 TABLET BY MOUTH DAILY 10/19/19   Corky Downs, MD  lisinopril (ZESTRIL) 30 MG tablet Take 30 mg by mouth daily.  08/30/17   [provider]  memantine (NAMENDA) 10 MG tablet Take 10 mg by mouth 2 (two) times daily.     [provider]  mirtazapine (REMERON) 7.5 MG tablet Take 7.5 mg by mouth at bedtime.     [provider]  Multiple Vitamin (MULTIVITAMIN) capsule Take 1 capsule by mouth daily.    [provider]  Multiple Vitamins-Minerals (PRESERVISION AREDS 2+MULTI VIT PO) Take 1 tablet by mouth 2 (two) times daily.     [provider]  pantoprazole (PROTONIX) 40 MG tablet Take 40 mg by mouth daily.    [provider]  vitamin B-12 (CYANOCOBALAMIN) 1000 MCG tablet Take 1,000 mcg by mouth daily.    [provider]  vitamin C (ASCORBIC ACID) 500 MG tablet Take 1,000 mg by mouth daily.     [provider]  Zinc 50 MG CAPS Take 50 mg by mouth daily.    [provider]    Allergies Patient has no known allergies.  Family History  Problem Relation Age of Onset  . Breast cancer Mother   . Dementia Father     Social History Social History   Tobacco Use  . Smoking status: Never Smoker  . Smokeless tobacco: Never Used  Substance Use Topics  . Alcohol use: No  . Drug use: No    Review of Systems  Constitutional: Negative for fever. Eyes: Negative for visual changes. ENT: Negative for facial injury or neck  injury Cardiovascular: Negative for chest injury. Respiratory: Negative for shortness of breath. Negative for chest wall injury. Gastrointestinal: Negative for abdominal pain or injury. Genitourinary: Negative for dysuria. Musculoskeletal: Negative for back injury, + R shoulder pain Skin: Negative for laceration/abrasions. Neurological: Negative for head injury.   ____________________________________________   PHYSICAL EXAM:  VITAL SIGNS: ED Triage Vitals  Enc Vitals Group     BP 08/20/20 0410 (!) 176/86     Pulse Rate 08/20/20 0410 61     Resp 08/20/20 0410 17     Temp 08/20/20 0410 97.8 F (36.6 C)     Temp Source 08/20/20 0410 Oral     SpO2 08/20/20 0410 97 %     Weight 08/20/20 0407 105 lb 13.1 oz (48 kg)     Height 08/20/20 0407 5\' 4"  (1.626 m)     Head Circumference --      Peak Flow --      Pain Score 08/20/20 0406 2     Pain Loc --      Pain  Edu? --      Excl. in GC? --     Full spinal precautions maintained throughout the trauma exam. Constitutional: Alert and oriented. No acute distress. Does not appear intoxicated. HEENT Head: Normocephalic and atraumatic. Face: No facial bony tenderness. Stable midface Ears: No hemotympanum bilaterally. No Battle sign Eyes: No eye injury. PERRL. No raccoon eyes Nose: Nontender. No epistaxis. No rhinorrhea Mouth/Throat: Mucous membranes are moist. No oropharyngeal blood. No dental injury. Airway patent without stridor. Normal voice. Neck: no C-collar. No midline c-spine tenderness.  Cardiovascular: Normal rate, regular rhythm. Normal and symmetric distal pulses are present in all extremities. Pulmonary/Chest: Chest wall is stable and nontender to palpation/compression. Normal respiratory effort. Breath sounds are normal. No crepitus.  Abdominal: Soft, nontender, non distended. Musculoskeletal: Pain with ROM of the R shoulder with no deformities.  Also complained of pain with tenderness to palpation of the right tib-fib with  no deformities.  Nontender with normal full range of motion in all extremities. No deformities. No thoracic or lumbar midline spinal tenderness. Pelvis is stable. Skin: Skin is warm, dry and intact. No abrasions or contutions. Psychiatric: Speech and behavior are appropriate. Neurological: Normal speech and language. Moves all extremities to command. No gross focal neurologic deficits are appreciated.  Glascow Coma Score: 4 - Opens eyes on own 6 - Follows simple motor commands 4 - Seems confused, disoriented GCS: 14   ____________________________________________   LABS (all labs ordered are listed, but only abnormal results are displayed)  Labs Reviewed  CBC WITH DIFFERENTIAL/PLATELET - Abnormal; Notable for the following components:      Result Value   RBC 3.64 (*)    Hemoglobin 11.9 (*)    HCT 35.5 (*)    All other components within normal limits  COMPREHENSIVE METABOLIC PANEL - Abnormal; Notable for the following components:   Creatinine, Ser 1.07 (*)    GFR, Estimated 49 (*)    All other components within normal limits  URINALYSIS, COMPLETE (UACMP) WITH MICROSCOPIC - Abnormal; Notable for the following components:   Color, Urine STRAW (*)    APPearance CLEAR (*)    All other components within normal limits   ____________________________________________  EKG  ED ECG REPORT I, Nita Sickle, the attending physician, personally viewed and interpreted this ECG.  Sinus rhythm, rate of 57, RBBB, LAFB, LVH.  No ST elevation.  Unchanged from prior from January 2021 ____________________________________________  RADIOLOGY  I have personally reviewed the images performed during this visit and I agree with the Radiologist's read.   Interpretation by Radiologist:  DG Shoulder Right  Result Date: 08/20/2020 CLINICAL DATA:  Fall which was unwitnessed.  Right shoulder pain EXAM: RIGHT SHOULDER - 2+ VIEW COMPARISON:  09/07/2016 FINDINGS: Remote humeral neck fracture which is  healed. Glenohumeral osteoarthritis with joint narrowing and spurring that is progressed from prior. Mild acromioclavicular joint spurring. Generalized osteopenia. IMPRESSION: 1. No acute finding. 2. Remote and healed right humeral neck fracture. 3. Advanced glenohumeral osteoarthritis that is progressed from 2018. Electronically Signed   By: Marnee Spring M.D.   On: 08/20/2020 05:11   DG Tibia/Fibula Right  Result Date: 08/20/2020 CLINICAL DATA:  87 year old female with history of trauma from a fall complaining of right leg pain. EXAM: RIGHT TIBIA AND FIBULA - 2 VIEW COMPARISON:  No priors. FINDINGS: There is no evidence of fracture or other focal bone lesions. Vascular calcifications are noted. Soft tissues are otherwise unremarkable. IMPRESSION: 1. No acute radiographic abnormality of the right tibia or fibula. 2.  Atherosclerosis. Electronically Signed   By: Trudie Reed M.D.   On: 08/20/2020 05:13   CT Head Wo Contrast  Result Date: 08/20/2020 CLINICAL DATA:  87 year old female with history of head trauma from an unwitnessed fall. EXAM: CT HEAD WITHOUT CONTRAST TECHNIQUE: Contiguous axial images were obtained from the base of the skull through the vertex without intravenous contrast. COMPARISON:  Head CT 06/12/2019. FINDINGS: Brain: Moderate cerebral and mild cerebellar atrophy. Patchy and confluent areas of decreased attenuation are noted throughout the deep and periventricular white matter of the cerebral hemispheres bilaterally, compatible with chronic microvascular ischemic disease. Physiologic calcifications of the basal ganglia bilaterally are incidentally noted. No evidence of acute infarction, hemorrhage, hydrocephalus, extra-axial collection or mass lesion/mass effect. Vascular: No hyperdense vessel or unexpected calcification. Skull: Normal. Negative for fracture or focal lesion. Sinuses/Orbits: Paranasal sinuses are well pneumatized. Large right mastoid effusion new compared to the prior  study. Other: None. IMPRESSION: 1. No evidence of significant acute traumatic injury to the brain. 2. Large right mastoid effusion. 3. No acute displaced skull fractures. 4. Moderate cerebral and mild cerebellar atrophy with extensive chronic microvascular ischemic changes in the cerebral white matter, as above. Electronically Signed   By: Trudie Reed M.D.   On: 08/20/2020 05:30     ____________________________________________   PROCEDURES  Procedure(s) performed: yes .1-3 Lead EKG Interpretation Performed by: Nita Sickle, MD Authorized by: Nita Sickle, MD     Interpretation: abnormal     ECG rate assessment: normal     Rhythm: sinus rhythm     Ectopy: none     Conduction: abnormal     Critical Care performed:  None ____________________________________________   INITIAL IMPRESSION / ASSESSMENT AND PLAN / ED COURSE  87 y.o. female with history of Alzheimer's, rheumatoid arthritis, hypertension, falls who presents from her nursing home for unwitnessed fall.  Patient is well-appearing, alert and oriented x2 with a GCS of 15.  No obvious signs of trauma on exam although she is complaining of pain with rotation of the right shoulder.  She has no CT and L-spine tenderness.  Her daughter is at bedside and reports the patient is currently at baseline.  Vital signs are within normal limits.  Head CT, and x-ray of right shoulder and right tib-fib visualized by me with no acute traumatic injuries, confirmed by radiology.  EKG unchanged from baseline with no signs of dysrhythmias.  Patient monitored on telemetry for 2.5 hours with no signs of dysrhythmias.  Labs with mild stable anemia, no leukocytosis, no signs of sepsis, creatinine is at baseline, no electrolyte derangements, UA with no evidence of urinary tract infection or dehydration.  Patient remains at baseline.  Will discharge to nursing home on supportive care with follow-up with PCP.  Discussed my standard return  precautions with her daughter.       ____________________________________________  Please note:  Patient was evaluated in Emergency Department today for the symptoms described in the history of present illness. Patient was evaluated in the context of the global COVID-19 pandemic, which necessitated consideration that the patient might be at risk for infection with the SARS-CoV-2 virus that causes COVID-19. Institutional protocols and algorithms that pertain to the evaluation of patients at risk for COVID-19 are in a state of rapid change based on information released by regulatory bodies including the CDC and federal and state organizations. These policies and algorithms were followed during the patient's care in the ED.  Some ED evaluations and interventions may be delayed as a  result of limited staffing during the pandemic.   ____________________________________________   FINAL CLINICAL IMPRESSION(S) / ED DIAGNOSES   Final diagnoses:  Fall, initial encounter      NEW MEDICATIONS STARTED DURING THIS VISIT:  ED Discharge Orders    None       Note:  This document was prepared using Dragon voice recognition software and may include unintentional dictation errors.    Don Perking, [place], MD 08/20/20 [phone]

## 2020-08-20 NOTE — Discharge Instructions (Addendum)

## 2020-08-20 NOTE — ED Triage Notes (Signed)
Pt presents to ER from [facility].  Per ems, pt had unwitnessed fall at home.  Unknown if any LOC or if pt is on blood thinners.  Pt A&O to her baseline at this time.  Pt c/o right shoulder pain from fall.  No obvious deformity noted to shoulder, but it is tender on palpation.

## 2020-08-30 ENCOUNTER — Emergency Department
Admission: EM | Admit: 2020-08-30 | Discharge: 2020-08-30 | Disposition: A | Discharge status: Home / Self Care | Payer: Medicare Other | Attending: Emergency Medicine | Admitting: Emergency Medicine

## 2020-08-30 ENCOUNTER — Emergency Department: Payer: Medicare Other

## 2020-08-30 ENCOUNTER — Encounter: Payer: Self-pay | Admitting: Emergency Medicine

## 2020-08-30 ENCOUNTER — Other Ambulatory Visit: Payer: Self-pay

## 2020-08-30 DIAGNOSIS — W1830XA Fall on same level, unspecified, initial encounter: Secondary | ICD-10-CM | POA: Insufficient documentation

## 2020-08-30 DIAGNOSIS — M25551 Pain in right hip: Secondary | ICD-10-CM | POA: Insufficient documentation

## 2020-08-30 DIAGNOSIS — I10 Essential (primary) hypertension: Secondary | ICD-10-CM | POA: Diagnosis not present

## 2020-08-30 DIAGNOSIS — G8929 Other chronic pain: Secondary | ICD-10-CM

## 2020-08-30 DIAGNOSIS — R0789 Other chest pain: Secondary | ICD-10-CM | POA: Diagnosis not present

## 2020-08-30 DIAGNOSIS — F0390 Unspecified dementia without behavioral disturbance: Secondary | ICD-10-CM | POA: Insufficient documentation

## 2020-08-30 DIAGNOSIS — Z96642 Presence of left artificial hip joint: Secondary | ICD-10-CM | POA: Insufficient documentation

## 2020-08-30 DIAGNOSIS — Z79899 Other long term (current) drug therapy: Secondary | ICD-10-CM | POA: Diagnosis not present

## 2020-08-30 DIAGNOSIS — Z8616 Personal history of COVID-19: Secondary | ICD-10-CM | POA: Diagnosis not present

## 2020-08-30 DIAGNOSIS — W19XXXA Unspecified fall, initial encounter: Secondary | ICD-10-CM

## 2020-08-30 LAB — BASIC METABOLIC PANEL
Anion gap: 7 (ref 5–15)
BUN: 22 mg/dL (ref 8–23)
CO2: 28 mmol/L (ref 22–32)
Calcium: 9.3 mg/dL (ref 8.9–10.3)
Chloride: 101 mmol/L (ref 98–111)
Creatinine, Ser: 1.04 mg/dL — ABNORMAL HIGH (ref 0.44–1.00)
GFR, Estimated: 51 mL/min — ABNORMAL LOW (ref >60)
Glucose, Bld: 120 mg/dL — ABNORMAL HIGH (ref 70–99)
Potassium: 4.2 mmol/L (ref 3.5–5.1)
Sodium: 136 mmol/L (ref 135–145)

## 2020-08-30 LAB — URINALYSIS, COMPLETE (UACMP) WITH MICROSCOPIC
Bacteria, UA: NONE SEEN
Bilirubin Urine: NEGATIVE
Glucose, UA: NEGATIVE mg/dL
Hgb urine dipstick: NEGATIVE
Ketones, ur: NEGATIVE mg/dL
Leukocytes,Ua: NEGATIVE
Nitrite: NEGATIVE
Protein, ur: NEGATIVE mg/dL
Specific Gravity, Urine: 1.006 (ref 1.005–1.030)
pH: 7 (ref 5.0–8.0)

## 2020-08-30 LAB — CBC WITH DIFFERENTIAL/PLATELET
Abs Immature Granulocytes: 0.03 10*3/uL (ref 0.00–0.07)
Basophils Absolute: 0.1 10*3/uL (ref 0.0–0.1)
Basophils Relative: 1 %
Eosinophils Absolute: 0.1 10*3/uL (ref 0.0–0.5)
Eosinophils Relative: 1 %
HCT: 37.5 % (ref 36.0–46.0)
Hemoglobin: 12.2 g/dL (ref 12.0–15.0)
Immature Granulocytes: 0 %
Lymphocytes Relative: 24 %
Lymphs Abs: 1.7 10*3/uL (ref 0.7–4.0)
MCH: 32.5 pg (ref 26.0–34.0)
MCHC: 32.5 g/dL (ref 30.0–36.0)
MCV: 100 fL (ref 80.0–100.0)
Monocytes Absolute: 0.8 10*3/uL (ref 0.1–1.0)
Monocytes Relative: 11 %
Neutro Abs: 4.5 10*3/uL (ref 1.7–7.7)
Neutrophils Relative %: 63 %
Platelets: 211 10*3/uL (ref 150–400)
RBC: 3.75 MIL/uL — ABNORMAL LOW (ref 3.87–5.11)
RDW: 11.9 % (ref 11.5–15.5)
WBC: 7.2 10*3/uL (ref 4.0–10.5)
nRBC: 0 % (ref 0.0–0.2)

## 2020-08-30 NOTE — ED Notes (Signed)
Patient placed on pure wick °

## 2020-08-30 NOTE — ED Triage Notes (Signed)
Patient arrived via EMS from [place] for an unwitnessed fall. Patient is complaining of right and left leg pain along with right rib pain. Patient does not take blood thinners.

## 2020-08-30 NOTE — ED Provider Notes (Signed)
[facility] Emergency Department Provider Note   ____________________________________________   Event Date/Time   First MD Initiated Contact with Patient 08/30/20 1426     (approximate)  I have reviewed the triage vital signs and the nursing notes.   HISTORY  Chief Complaint Fall    HPI Angel Simmons is a 87 y.o. female with past medical history of hypertension, rheumatoid arthritis, and dementia who presents to the ED for fall.  EMS reports that patient had an unwitnessed fall at [facility], was found on the ground by staff.  She complains of pain in her right hip as well as along her left chest wall.  She does not remember falling and is unsure whether she hit her head.  She is not on any blood thinners.        Past Medical History:  Diagnosis Date  . Chronic back pain   . Hip fx (HCC)   . Hypertension   . Rheumatoid arthritis (HCC)   . Spinal stenosis     Patient Active Problem List   Diagnosis Date Noted  . Acute metabolic encephalopathy 06/09/2019  . COVID-19 virus infection 06/09/2019  . Dementia without behavioral disturbance (HCC) 06/09/2019  . Bilateral lower extremity edema 01/21/2018  . Lower extremity pain, bilateral 01/21/2018  . Acute right lumbar radiculopathy 09/19/2017  . Thigh shingles (Right) 09/19/2017  . 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 Spondylosis without myelopathy or radiculopathy, lumbosacral region 09/19/2017  . Lumbar facet syndrome (Bilateral) 09/19/2017  . Abnormal MRI, lumbar spine (12/21/2013) 09/19/2017  . Post herpetic neuralgia 09/12/2017  . Acute encephalopathy 09/12/2017  . UTI (urinary tract infection) 09/12/2017  . HTN (hypertension) 09/12/2017  . Chronic pain of multiple joints 02/06/2017  . At high risk for falls 06/08/2016  . History of fall 06/08/2016  . Major neurocognitive disorder due to multiple etiologies (HCC) 06/08/2016  . Ulcer of esophagus  with bleeding 11/14/2015  . C. difficile diarrhea 11/13/2015  . Gastroesophageal reflux disease without esophagitis 11/12/2015  . Melanotic stools 11/12/2015  . Essential hypertension 11/12/2015  . S/P hip hemiarthroplasty 07/02/2015  . Fracture of left hip requiring operative repair (HCC) 05/17/2015  . HNP (herniated nucleus pulposus), lumbar 04/26/2015  . Hip fracture (HCC) 04/04/2015  . Chronic LBP 09/25/2013  . Degeneration of intervertebral disc of lumbar region 09/25/2013  . Neuritis or radiculitis due to rupture of lumbar intervertebral disc 09/25/2013  . Degenerative arthritis of lumbar spine 09/25/2013  . Lumbar radiculitis 09/25/2013  . Chronic low back pain (Secondary Area of Pain) (Bilateral) with sciatica (Right) 09/25/2013  . DDD (degenerative disc disease), lumbar 09/25/2013  . Lumbar spondylosis 09/25/2013    Past Surgical History:  Procedure Laterality Date  . CATARACT EXTRACTION, BILATERAL    . CHOLECYSTECTOMY    . HEMORRHOID SURGERY    . HIP ARTHROPLASTY Left 04/05/2015   Procedure: ARTHROPLASTY BIPOLAR HIP (HEMIARTHROPLASTY);  Surgeon: Donato Heinz, MD;  Location: [facility] ORS;  Service: Orthopedics;  Laterality: Left;  . Percutaneous pinning of a right femoral neck fracture      Prior to Admission medications   Medication Sig Start Date End Date Taking? Authorizing Provider  acetaminophen (TYLENOL) 325 MG tablet Take 2 tablets (650 mg total) by mouth 2 (two) times daily as needed. At 1400 and 2200. 06/18/19   Darlin Priestly, MD  calcium carbonate (OSCAL) 1500 (600 Ca) MG TABS tablet Take 1,500 mg by mouth 2 (two) times daily with a meal.  [provider]  cholecalciferol (VITAMIN D) 25 MCG (1000 UNIT) tablet Take 1,000 Units by mouth daily.     [provider]  Docusate Sodium (DSS) 100 MG CAPS Take 200 mg by mouth 2 (two) times daily.  11/15/15   [provider]  DULoxetine (CYMBALTA) 30 MG capsule Take 30 mg by mouth daily.    [provider]  feeding supplement, ENSURE ENLIVE, (ENSURE ENLIVE) LIQD Take 237 mLs by mouth 3 (three) times daily between meals. 06/18/19   Darlin Priestly, MD  gabapentin (NEURONTIN) 300 MG capsule TK ONE C PO BID 11/20/17   [provider]  lisinopril (ZESTRIL) 20 MG tablet TAKE 1 TABLET BY MOUTH DAILY 10/19/19   Corky Downs, MD  lisinopril (ZESTRIL) 30 MG tablet Take 30 mg by mouth daily.  08/30/17   [provider]  memantine (NAMENDA) 10 MG tablet Take 10 mg by mouth 2 (two) times daily.     [provider]  mirtazapine (REMERON) 7.5 MG tablet Take 7.5 mg by mouth at bedtime.     [provider]  Multiple Vitamin (MULTIVITAMIN) capsule Take 1 capsule by mouth daily.    [provider]  Multiple Vitamins-Minerals (PRESERVISION AREDS 2+MULTI VIT PO) Take 1 tablet by mouth 2 (two) times daily.     [provider]  pantoprazole (PROTONIX) 40 MG tablet Take 40 mg by mouth daily.    [provider]  vitamin B-12 (CYANOCOBALAMIN) 1000 MCG tablet Take 1,000 mcg by mouth daily.    [provider]  vitamin C (ASCORBIC ACID) 500 MG tablet Take 1,000 mg by mouth daily.     [provider]  Zinc 50 MG CAPS Take 50 mg by mouth daily.    [provider]    Allergies Shellfish allergy  Family History  Problem Relation Age of Onset  . Breast cancer Mother   . Dementia Father     Social History Social History   Tobacco Use  . Smoking status: Never Smoker  . Smokeless tobacco: Never Used  Substance Use Topics  . Alcohol use: No  . Drug use: No    Review of Systems  Constitutional: No fever/chills Eyes: No visual changes. ENT: No sore throat. Cardiovascular: Positive for chest wall pain. Respiratory: Denies shortness of breath. Gastrointestinal: No abdominal pain.  No nausea, no vomiting.  No diarrhea.  No constipation. Genitourinary: Negative for dysuria. Musculoskeletal: Negative for back pain.  Positive  for right hip pain. Skin: Negative for rash. Neurological: Negative for headaches, focal weakness or numbness.  ____________________________________________   PHYSICAL EXAM:  VITAL SIGNS: ED Triage Vitals [08/30/20 1425]  Enc Vitals Group     BP      Pulse      Resp      Temp      Temp src      SpO2 98 %     Weight      Height      Head Circumference      Peak Flow      Pain Score      Pain Loc      Pain Edu?      Excl. in GC?     Constitutional: Awake and alert. Eyes: Conjunctivae are normal. Head: Atraumatic. Nose: No congestion/rhinnorhea. Mouth/Throat: Mucous membranes are moist. Neck: Normal ROM, midline cervical spine tenderness noted. Cardiovascular: Normal rate, regular rhythm. Grossly normal heart sounds. Respiratory: Normal respiratory effort.  No retractions. Lungs CTAB. Gastrointestinal: Soft and  nontender. No distention. Genitourinary: deferred Musculoskeletal: Diffuse tenderness to right hip, no left hip tenderness.  No tenderness to bilateral knees or ankles.  No upper extremity bony tenderness to palpation. Neurologic:  Normal speech and language. No gross focal neurologic deficits are appreciated. Skin:  Skin is warm, dry and intact. No rash noted. Psychiatric: Mood and affect are normal. Speech and behavior are normal.  ____________________________________________   LABS (all labs ordered are listed, but only abnormal results are displayed)  Labs Reviewed  CBC WITH DIFFERENTIAL/PLATELET - Abnormal; Notable for the following components:      Result Value   RBC 3.75 (*)    All other components within normal limits  BASIC METABOLIC PANEL - Abnormal; Notable for the following components:   Glucose, Bld 120 (*)    Creatinine, Ser 1.04 (*)    GFR, Estimated 51 (*)    All other components within normal limits  URINALYSIS, COMPLETE (UACMP) WITH MICROSCOPIC - Abnormal; Notable for the following components:   Color, Urine YELLOW (*)    APPearance  CLEAR (*)    All other components within normal limits   ____________________________________________  EKG  ED ECG REPORT I, Chesley Noon, the attending physician, personally viewed and interpreted this ECG.   Date: 08/30/2020  EKG Time: 14:35  Rate: 77  Rhythm: normal sinus rhythm  Axis: LAD  Intervals:right bundle branch block and left anterior fascicular block  ST&T Change: None   PROCEDURES  Procedure(s) performed (including Critical Care):  Procedures   ____________________________________________   INITIAL IMPRESSION / ASSESSMENT AND PLAN / ED COURSE       87 year old female with past medical history of hypertension, rheumatoid arthritis, dementia presents to the ED for unwitnessed fall at her nursing facility, now complains of left chest wall and right hip pain.  We will further assess with chest x-ray, x-rays of right hip, and CT head as well as cervical spine.  Given unwitnessed fall, we will screen EKG and labs.  Patient appears at her baseline mental status with no focal neurologic deficits.  EKG shows no evidence of arrhythmia or ischemia, labs are unremarkable and UA shows no signs of infection.  X-rays of right hip reviewed by me and show no obvious fracture or dislocation.  CT head and cervical spine results are pending at this time.  Patient turned over to oncoming provider but if remainder of imaging is unremarkable, she would be appropriate for discharge back to nursing facility.  Daughter agrees with plan.      ____________________________________________   FINAL CLINICAL IMPRESSION(S) / ED DIAGNOSES  Final diagnoses:  Fall, initial encounter  Chronic pain of right hip     ED Discharge Orders    None       Note:  This document was prepared using Dragon voice recognition software and may include unintentional dictation errors.   Chesley Noon, MD 08/30/20 1534

## 2020-08-30 NOTE — ED Provider Notes (Signed)
Patient received in signout from Dr. Larinda Buttery pending follow-up CT imaging.  CT imaging was without evidence of traumatic injury.  She denies hemodynamically stable.  Appears appropriate for discharge back to facility.  Have discussed with the patient and available family all diagnostics and treatments performed thus far and all questions were answered to the best of my ability. The patient demonstrates understanding and agreement with plan.    Willy Eddy, MD 08/30/20 [phone]

## 2020-10-20 ENCOUNTER — Other Ambulatory Visit: Payer: Self-pay | Admitting: Internal Medicine

## 2020-10-31 ENCOUNTER — Encounter: Payer: Self-pay | Admitting: Emergency Medicine

## 2020-10-31 ENCOUNTER — Emergency Department: Payer: Medicare Other

## 2020-10-31 ENCOUNTER — Other Ambulatory Visit: Payer: Self-pay

## 2020-10-31 ENCOUNTER — Observation Stay
Admission: EM | Admit: 2020-10-31 | Discharge: 2020-11-01 | Disposition: A | Discharge status: Home / Self Care | Payer: Medicare Other | Attending: Internal Medicine | Admitting: Internal Medicine

## 2020-10-31 DIAGNOSIS — F0390 Unspecified dementia without behavioral disturbance: Secondary | ICD-10-CM | POA: Insufficient documentation

## 2020-10-31 DIAGNOSIS — N179 Acute kidney failure, unspecified: Secondary | ICD-10-CM | POA: Diagnosis not present

## 2020-10-31 DIAGNOSIS — N1831 Chronic kidney disease, stage 3a: Secondary | ICD-10-CM | POA: Insufficient documentation

## 2020-10-31 DIAGNOSIS — Z96642 Presence of left artificial hip joint: Secondary | ICD-10-CM | POA: Insufficient documentation

## 2020-10-31 DIAGNOSIS — K449 Diaphragmatic hernia without obstruction or gangrene: Secondary | ICD-10-CM

## 2020-10-31 DIAGNOSIS — E039 Hypothyroidism, unspecified: Secondary | ICD-10-CM | POA: Diagnosis not present

## 2020-10-31 DIAGNOSIS — K529 Noninfective gastroenteritis and colitis, unspecified: Principal | ICD-10-CM | POA: Insufficient documentation

## 2020-10-31 DIAGNOSIS — Z79899 Other long term (current) drug therapy: Secondary | ICD-10-CM | POA: Insufficient documentation

## 2020-10-31 DIAGNOSIS — F03918 Unspecified dementia, unspecified severity, with other behavioral disturbance: Secondary | ICD-10-CM

## 2020-10-31 DIAGNOSIS — F0391 Unspecified dementia with behavioral disturbance: Secondary | ICD-10-CM

## 2020-10-31 DIAGNOSIS — G8929 Other chronic pain: Secondary | ICD-10-CM | POA: Diagnosis present

## 2020-10-31 DIAGNOSIS — E860 Dehydration: Secondary | ICD-10-CM | POA: Diagnosis not present

## 2020-10-31 DIAGNOSIS — M545 Low back pain, unspecified: Secondary | ICD-10-CM | POA: Diagnosis not present

## 2020-10-31 DIAGNOSIS — Z20822 Contact with and (suspected) exposure to covid-19: Secondary | ICD-10-CM | POA: Diagnosis not present

## 2020-10-31 DIAGNOSIS — R531 Weakness: Secondary | ICD-10-CM | POA: Diagnosis present

## 2020-10-31 DIAGNOSIS — I129 Hypertensive chronic kidney disease with stage 1 through stage 4 chronic kidney disease, or unspecified chronic kidney disease: Secondary | ICD-10-CM | POA: Diagnosis not present

## 2020-10-31 HISTORY — DX: Unspecified dementia, unspecified severity, without behavioral disturbance, psychotic disturbance, mood disturbance, and anxiety: F03.90

## 2020-10-31 HISTORY — DX: Other intervertebral disc degeneration, lumbar region without mention of lumbar back pain or lower extremity pain: M51.369

## 2020-10-31 HISTORY — DX: Other intervertebral disc degeneration, lumbar region: M51.36

## 2020-10-31 LAB — CBC
HCT: 38.4 % (ref 36.0–46.0)
Hemoglobin: 12.8 g/dL (ref 12.0–15.0)
MCH: 32.1 pg (ref 26.0–34.0)
MCHC: 33.3 g/dL (ref 30.0–36.0)
MCV: 96.2 fL (ref 80.0–100.0)
Platelets: 178 10*3/uL (ref 150–400)
RBC: 3.99 MIL/uL (ref 3.87–5.11)
RDW: 12 % (ref 11.5–15.5)
WBC: 9.1 10*3/uL (ref 4.0–10.5)
nRBC: 0 % (ref 0.0–0.2)

## 2020-10-31 LAB — COMPREHENSIVE METABOLIC PANEL
ALT: 20 U/L (ref 0–44)
AST: 31 U/L (ref 15–41)
Albumin: 4.1 g/dL (ref 3.5–5.0)
Alkaline Phosphatase: 64 U/L (ref 38–126)
Anion gap: 8 (ref 5–15)
BUN: 29 mg/dL — ABNORMAL HIGH (ref 8–23)
CO2: 25 mmol/L (ref 22–32)
Calcium: 9.6 mg/dL (ref 8.9–10.3)
Chloride: 100 mmol/L (ref 98–111)
Creatinine, Ser: 1.08 mg/dL — ABNORMAL HIGH (ref 0.44–1.00)
GFR, Estimated: 49 mL/min — ABNORMAL LOW (ref >60)
Glucose, Bld: 121 mg/dL — ABNORMAL HIGH (ref 70–99)
Potassium: 3.7 mmol/L (ref 3.5–5.1)
Sodium: 133 mmol/L — ABNORMAL LOW (ref 135–145)
Total Bilirubin: 1.1 mg/dL (ref 0.3–1.2)
Total Protein: 7 g/dL (ref 6.5–8.1)

## 2020-10-31 LAB — RESP PANEL BY RT-PCR (FLU A&B, COVID) ARPGX2
Influenza A by PCR: NEGATIVE
Influenza B by PCR: NEGATIVE
SARS Coronavirus 2 by RT PCR: NEGATIVE

## 2020-10-31 LAB — LIPASE, BLOOD: Lipase: 98 U/L — ABNORMAL HIGH (ref 11–51)

## 2020-10-31 MED ORDER — SODIUM CHLORIDE 0.9 % IV SOLN: INTRAVENOUS | Status: AC

## 2020-10-31 MED ORDER — TRAZODONE HCL 50 MG PO TABS
50.0000 mg | ORAL_TABLET | Freq: Every day | ORAL | Status: DC
  Administered 2020-10-31: 50 mg via ORAL
  Filled 2020-10-31: qty 1

## 2020-10-31 MED ORDER — ONDANSETRON HCL 4 MG/2ML IJ SOLN: 4.0000 mg | Freq: Four times a day (QID) | INTRAMUSCULAR | Status: DC | PRN

## 2020-10-31 MED ORDER — OXYCODONE HCL 5 MG PO TABS
5.0000 mg | ORAL_TABLET | Freq: Two times a day (BID) | ORAL | Status: DC
  Administered 2020-10-31 – 2020-11-01 (×3): 5 mg via ORAL
  Filled 2020-10-31 (×3): qty 1

## 2020-10-31 MED ORDER — DULOXETINE HCL 30 MG PO CPEP
30.0000 mg | ORAL_CAPSULE | Freq: Every day | ORAL | Status: DC
  Administered 2020-11-01: 30 mg via ORAL
  Filled 2020-10-31: qty 1

## 2020-10-31 MED ORDER — GABAPENTIN 300 MG PO CAPS
300.0000 mg | ORAL_CAPSULE | Freq: Two times a day (BID) | ORAL | Status: DC
  Administered 2020-10-31 – 2020-11-01 (×2): 300 mg via ORAL
  Filled 2020-10-31 (×2): qty 1

## 2020-10-31 MED ORDER — ACETAMINOPHEN 325 MG PO TABS: 650.0000 mg | ORAL_TABLET | Freq: Two times a day (BID) | ORAL | Status: DC | PRN

## 2020-10-31 MED ORDER — LISINOPRIL 10 MG PO TABS
10.0000 mg | ORAL_TABLET | Freq: Every day | ORAL | Status: DC
  Administered 2020-11-01: 10 mg via ORAL
  Filled 2020-10-31: qty 1

## 2020-10-31 MED ORDER — ONDANSETRON HCL 4 MG/2ML IJ SOLN
4.0000 mg | Freq: Once | INTRAMUSCULAR | Status: AC
  Administered 2020-10-31: 4 mg via INTRAVENOUS
  Filled 2020-10-31: qty 2

## 2020-10-31 MED ORDER — PANTOPRAZOLE SODIUM 40 MG PO TBEC
40.0000 mg | DELAYED_RELEASE_TABLET | Freq: Every day | ORAL | Status: DC
  Administered 2020-11-01: 40 mg via ORAL
  Filled 2020-10-31: qty 1

## 2020-10-31 MED ORDER — LEVOTHYROXINE SODIUM 50 MCG PO TABS
50.0000 ug | ORAL_TABLET | Freq: Every day | ORAL | Status: DC
  Administered 2020-11-01: 50 ug via ORAL
  Filled 2020-10-31: qty 1

## 2020-10-31 MED ORDER — IOHEXOL 300 MG/ML  SOLN
75.0000 mL | Freq: Once | INTRAMUSCULAR | Status: AC | PRN
  Administered 2020-10-31: 75 mL via INTRAVENOUS

## 2020-10-31 MED ORDER — HALOPERIDOL LACTATE 5 MG/ML IJ SOLN: 2.0000 mg | Freq: Four times a day (QID) | INTRAMUSCULAR | Status: DC | PRN

## 2020-10-31 MED ORDER — MEMANTINE HCL 5 MG PO TABS
10.0000 mg | ORAL_TABLET | Freq: Two times a day (BID) | ORAL | Status: DC
  Administered 2020-10-31 – 2020-11-01 (×2): 10 mg via ORAL
  Filled 2020-10-31 (×2): qty 2

## 2020-10-31 MED ORDER — ENOXAPARIN SODIUM 30 MG/0.3ML IJ SOSY
30.0000 mg | PREFILLED_SYRINGE | INTRAMUSCULAR | Status: DC
  Administered 2020-10-31: 30 mg via SUBCUTANEOUS
  Filled 2020-10-31: qty 0.3

## 2020-10-31 MED ORDER — HALOPERIDOL LACTATE 5 MG/ML IJ SOLN
2.0000 mg | Freq: Once | INTRAMUSCULAR | Status: AC
  Administered 2020-10-31: 2 mg via INTRAVENOUS
  Filled 2020-10-31: qty 1

## 2020-10-31 MED ORDER — SODIUM CHLORIDE 0.9 % IV BOLUS
1000.0000 mL | Freq: Once | INTRAVENOUS | Status: AC
  Administered 2020-10-31: 1000 mL via INTRAVENOUS

## 2020-10-31 MED ORDER — MIRTAZAPINE 15 MG PO TABS
15.0000 mg | ORAL_TABLET | Freq: Every day | ORAL | Status: DC
  Administered 2020-10-31: 15 mg via ORAL
  Filled 2020-10-31: qty 1

## 2020-10-31 NOTE — ED Notes (Signed)
Brief saturated, changed & placed purewick.

## 2020-10-31 NOTE — ED Notes (Signed)
Pt to ED from [facility], put in room now from triage. Daughter at bedside. Daughter states that pt vomited once and had 1 diarrheal stool today. Daughter reports that pt has dementia and today had altered mental status (" words not making sense" but that since pt arrived to ED pt has been back at baseline.

## 2020-10-31 NOTE — H&P (Addendum)
History and Physical    [name] VOZ:[id number] DOB: 02-Apr-1929 DOA: 10/31/2020  PCP: Gracelyn Nurse, MD  Patient coming from: [facility]  I have personally briefly reviewed patient's old medical records in [facility]  Chief Complaint: nausea,vomiting and diarrhea   HPI: Angel Simmons is a 87 y.o. female with medical history significant for severe dementia, HTN, GERD, hx of C.diff, hypothyroidism who presents from [facility] with generalized weakness, nausea, vomiting and diarrhea.  Patient unable to provide history given her severe dementia.  She was moaning,grimacing and reaching for her back during my evaluation.  She also just received Haldol prior to my arrival for agitation.  No family at bedside. I spoke with daughter over the phone and she reports that [facility] SNF called her since pt was not eating well and had 1 episode each of vomiting and diarrhea. No fever that she is aware of. There were other residents at the nursing facility with similar symptoms that also presented to the ER.    ED Course: She was afebrile, intermittently hypertensive with a systolic of 180s on room air.  No leukocytosis or anemia.  Sodium of 133.  Creatinine with AKI at 1.08 from a prior of 0.90.  BG of 121.  Lipase of 98. CTA abdomen did not show any pancreatic abnormalities, has new large hiatal hernia and a fluid-filled colon consistent with diarrheal illness.  GI panel is pending.  Review of Systems: Unable to Obtain given severe dementia  Past Medical History:  Diagnosis Date   Chronic back pain    DDD (degenerative disc disease), lumbar    Dementia (HCC)    Hip fx (HCC)    Hypertension    Rheumatoid arthritis (HCC)    Spinal stenosis     Past Surgical History:  Procedure Laterality Date   CATARACT EXTRACTION, BILATERAL     CHOLECYSTECTOMY     HEMORRHOID SURGERY     HIP ARTHROPLASTY Left 04/05/2015   Procedure: ARTHROPLASTY BIPOLAR HIP (HEMIARTHROPLASTY);  Surgeon:  Donato Heinz, MD;  Location: [facility] ORS;  Service: Orthopedics;  Laterality: Left;   Percutaneous pinning of a right femoral neck fracture       reports that she has never smoked. She has never used smokeless tobacco. She reports that she does not drink alcohol and does not use drugs. Social History  Allergies  Allergen Reactions   Shellfish Allergy     Family History  Problem Relation Age of Onset   Breast cancer Mother    Dementia Father      Prior to Admission medications   Medication Sig Start Date End Date Taking? Authorizing Provider  acetaminophen (TYLENOL) 325 MG tablet Take 2 tablets (650 mg total) by mouth 2 (two) times daily as needed. At 1400 and 2200. Patient taking differently: Take 650 mg by mouth every 8 (eight) hours as needed. 06/18/19  Yes Darlin Priestly, MD  Cholecalciferol (VITAMIN D-3) 125 MCG (5000 UT) TABS Take 1 tablet by mouth daily.   Yes [provider]  Docusate Sodium (DSS) 100 MG CAPS Take 200 mg by mouth 2 (two) times daily.  11/15/15  Yes [provider]  DULoxetine (CYMBALTA) 30 MG capsule Take 30 mg by mouth daily.   Yes [provider]  gabapentin (NEURONTIN) 300 MG capsule Take 300 mg by mouth 2 (two) times daily. 11/20/17  Yes [provider]  levothyroxine (SYNTHROID) 50 MCG tablet Take 50 mcg by mouth daily before breakfast.   Yes  [provider]  lisinopril (ZESTRIL) 10 MG tablet Take 10 mg by mouth daily.   Yes [provider]  memantine (NAMENDA) 10 MG tablet Take 10 mg by mouth 2 (two) times daily.    Yes [provider]  Menthol, Topical Analgesic, (BIOFREEZE) 4 % GEL Apply 1 application topically 3 (three) times daily.   Yes [provider]  mirtazapine (REMERON) 15 MG tablet Take 15 mg by mouth at bedtime.   Yes [provider]  Multiple Vitamin (MULTIVITAMIN) capsule Take 1 capsule by mouth daily.   Yes [provider]  Multiple Vitamins-Minerals  (PRESERVISION AREDS 2+MULTI VIT PO) Take 1 tablet by mouth 2 (two) times daily.    Yes [provider]  oxyCODONE (OXY IR/ROXICODONE) 5 MG immediate release tablet Take 5 mg by mouth 2 (two) times daily.   Yes [provider]  pantoprazole (PROTONIX) 40 MG tablet Take 40 mg by mouth daily.   Yes [provider]  traZODone (DESYREL) 50 MG tablet Take 50 mg by mouth at bedtime.   Yes [provider]  calcium carbonate (OSCAL) 1500 (600 Ca) MG TABS tablet Take 1,500 mg by mouth 2 (two) times daily with a meal. Patient not taking: Reported on 10/31/2020    [provider]  feeding supplement, ENSURE ENLIVE, (ENSURE ENLIVE) LIQD Take 237 mLs by mouth 3 (three) times daily between meals. 06/18/19   Darlin Priestly, MD  lisinopril (ZESTRIL) 20 MG tablet TAKE 1 TABLET BY MOUTH DAILY Patient not taking: Reported on 10/31/2020 10/20/20   Corky Downs, MD    Physical Exam: Vitals:   10/31/20 1900 10/31/20 1930 10/31/20 2000 10/31/20 2039  BP: (!) 143/81 (!) 155/101 (!) 180/89 (!) 159/88  Pulse: 89 89 (!) 103 89  Resp: 16 16 16 16   Temp:      TempSrc:      SpO2: 95% 96% 95% 92%  Weight:      Height:        Constitutional: Thin elderly cachectic female laying at 40 degree incline in bed although restless and repeatedly tried to get up.  At times was moaning and grimacing and reaching out to her back and saying "please" repeatedly. Vitals:   10/31/20 1900 10/31/20 1930 10/31/20 2000 10/31/20 2039  BP: (!) 143/81 (!) 155/101 (!) 180/89 (!) 159/88  Pulse: 89 89 (!) 103 89  Resp: 16 16 16 16   Temp:      TempSrc:      SpO2: 95% 96% 95% 92%  Weight:      Height:       Eyes: PERRL, lids and conjunctivae normal ENMT: Mucous membranes are moist.  Neck: normal, supple Respiratory: clear to auscultation bilaterally, no wheezing, no crackles. Normal respiratory effort. No accessory muscle use.  Cardiovascular: Regular rate and rhythm, no murmurs / rubs / gallops. No  extremity edema. Abdomen: no tenderness, no masses palpated. Minimal Bowel sounds .  Musculoskeletal: no clubbing / cyanosis. No joint deformity upper and lower extremities. Good ROM, contractures on all extremities. Normal muscle tone.  Skin: no rashes, lesions, ulcers. No induration Neurologic: Alert and able to move all extremities. Could not follow commands or answer questions appropriately.  Psychiatric: Severe dementia   Labs on Admission: I have personally reviewed following labs and imaging studies  CBC: Recent Labs  Lab 10/31/20 1816  WBC 9.1  HGB 12.8  HCT 38.4  MCV 96.2  PLT 178   Basic Metabolic Panel: Recent Labs  Lab 10/31/20 1816  NA 133*  K 3.7  CL 100  CO2 25  GLUCOSE 121*  BUN 29*  CREATININE 1.08*  CALCIUM 9.6   GFR: Estimated Creatinine Clearance: 29.6 mL/min (A) (by C-G formula based on SCr of 1.08 mg/dL (H)). Liver Function Tests: Recent Labs  Lab 10/31/20 1816  AST 31  ALT 20  ALKPHOS 64  BILITOT 1.1  PROT 7.0  ALBUMIN 4.1   Recent Labs  Lab 10/31/20 1816  LIPASE 98*   No results for input(s): AMMONIA in the last 168 hours. Coagulation Profile: No results for input(s): INR, PROTIME in the last 168 hours. Cardiac Enzymes: No results for input(s): CKTOTAL, CKMB, CKMBINDEX, TROPONINI in the last 168 hours. BNP (last 3 results) No results for input(s): PROBNP in the last 8760 hours. HbA1C: No results for input(s): HGBA1C in the last 72 hours. CBG: No results for input(s): GLUCAP in the last 168 hours. Lipid Profile: No results for input(s): CHOL, HDL, LDLCALC, TRIG, CHOLHDL, LDLDIRECT in the last 72 hours. Thyroid Function Tests: No results for input(s): TSH, T4TOTAL, FREET4, T3FREE, THYROIDAB in the last 72 hours. Anemia Panel: No results for input(s): VITAMINB12, FOLATE, FERRITIN, TIBC, IRON, RETICCTPCT in the last 72 hours. Urine analysis:    Component Value Date/Time   COLORURINE YELLOW (A) 08/30/2020 1445   APPEARANCEUR  CLEAR (A) 08/30/2020 1445   APPEARANCEUR Hazy 01/03/2014 0215   LABSPEC 1.006 08/30/2020 1445   LABSPEC 1.009 01/03/2014 0215   PHURINE 7.0 08/30/2020 1445   GLUCOSEU NEGATIVE 08/30/2020 1445   GLUCOSEU Negative 01/03/2014 0215   HGBUR NEGATIVE 08/30/2020 1445   BILIRUBINUR NEGATIVE 08/30/2020 1445   BILIRUBINUR Negative 01/03/2014 0215   KETONESUR NEGATIVE 08/30/2020 1445   PROTEINUR NEGATIVE 08/30/2020 1445   NITRITE NEGATIVE 08/30/2020 1445   LEUKOCYTESUR NEGATIVE 08/30/2020 1445   LEUKOCYTESUR 3+ 01/03/2014 0215    Radiological Exams on Admission: CT ABDOMEN PELVIS W CONTRAST  Result Date: 10/31/2020 CLINICAL DATA:  Vomiting and diarrhea today. Patient difficult to understand more confused today. EXAM: CT ABDOMEN AND PELVIS WITH CONTRAST TECHNIQUE: Multidetector CT imaging of the abdomen and pelvis was performed using the standard protocol following bolus administration of intravenous contrast. CONTRAST:  75mL OMNIPAQUE IOHEXOL 300 MG/ML  SOLN COMPARISON:  CT, 08/01/2005 FINDINGS: Lower chest: Reticular and patchy airspace lung opacities at the dependent lung bases consistent with atelectasis and/or scarring. Large hiatal hernia, new since the prior CT. Hepatobiliary: Liver normal in size. Subcentimeter low-attenuation lesions in the left lobe consistent with cysts. No other masses. There is intra and extrahepatic bile duct dilation. Common bile duct has a maximum diameter of 1.3 cm proximally, demonstrating normal distal tapering. Previous cholecystectomy. Duct dilation is similar to the prior CT. Pancreas: Pancreatic divisum.  No mass or inflammation. Spleen: Normal in size without focal abnormality. Adrenals/Urinary Tract: Thickened adrenal glands consistent with hyperplasia. Kidneys normal in overall size, orientation and position. Small low-density lesion, lateral midpole the right kidney, consistent with a cyst. No other masses. Small bilateral intrarenal stones. No hydronephrosis.  Ureters not well visualized in their entirety. No ureteral dilation or convincing stone. Bladder is unremarkable. Stomach/Bowel: Stomach unremarkable other than the large hiatal hernia. Small bowel and colon are normal in caliber. No wall thickening. No adjacent inflammation. Scattered colonic air-fluid levels. Air-fluid level in the rectum. There are numerous sigmoid colon diverticula without evidence of diverticulitis. Vascular/Lymphatic: Dense aortic and branch vessel atherosclerotic calcifications. No aneurysm. No enlarged lymph nodes. Reproductive: Uterus and bilateral adnexa are unremarkable. Other: No abdominal wall hernia  or abnormality. No abdominopelvic ascites. Musculoskeletal: No acute fracture. No bone lesion. There are significant degenerative changes of the lumbar spine including a prominent levoscoliosis. Patient has a left hip hemiarthroplasty and 2 screws reducing an old right femoral neck fracture. Orthopedic hardware appears well seated and positioned. IMPRESSION: 1. Air-fluid levels within the colon consistent with the given history of diarrhea. No bowel wall thickening or inflammation. No other evidence of an acute abnormality within the abdomen or pelvis. 2. Large hiatal hernia new since the prior exam. 3. Chronic intra and extrahepatic bile duct dilation. 4. Extensive aortic atherosclerosis extending to branch vessels. 5. Sigmoid colon diverticula.  No diverticulitis. Electronically Signed   By: Amie Portland M.D.   On: 10/31/2020 20:56      Assessment/Plan  Acute colitis likely viral  -numerous residents at SNF with similar symptoms -symptomatic management with Zofran and fluids  AKI -Creatinine of 1.08 -Has received 1L of fluid bolus in ED. continuous IV fluids for 10 hours. -Avoid nephrotoxic agent  Large hiatal hernia -New incidental finding on CT abdomen.  Low suspicion that this is the cause of her symptoms although likely worsens her nausea and vomiting.  Continue  symptomatic management as she would not be a candidate for any invasive intervention  Chronic back pain - Continue home oxycodone, gabapentin  Hypothyroidism - Continue levothyroxine  Severe dementia - Patient unable to provide any meaningful history or answer questions appropriately. Agitated while in the ED. -Continue memantine, mirtazapine, Cymbalta, trazodone  DVT prophylaxis:.Lovenox Code Status:DNR -confirmed with Daughter June Graham Family Communication: discussed with daughter over the phone disposition Plan: Home with observation Consults called:  Admission status: Observation  Level of care: Med-Surg  Status is: Observation  The patient remains OBS appropriate and will d/c before 2 midnights.  Dispo: The patient is from: SNF              Anticipated d/c is to: SNF              Patient currently is not medically stable to d/c.   Difficult to place patient No         Anselm Jungling DO [facility]   If 7PM-7AM, please contact night-coverage [email or web address]   10/31/2020, 9:17 PM

## 2020-10-31 NOTE — ED Notes (Signed)
DNR bracelet placed on pt.  

## 2020-10-31 NOTE — ED Provider Notes (Signed)
[facility] Emergency Department Provider Note  ____________________________________________   Event Date/Time   First MD Initiated Contact with Patient 10/31/20 [phone]     (approximate)  I have reviewed the triage vital signs and the nursing notes.   HISTORY  Chief Complaint Emesis and Altered Mental Status    HPI Angel Simmons is a 87 y.o. female with history of severe dementia, here with generalized weakness, nausea, vomiting, and diarrhea.  The patient presents from her skilled nursing facility.  She reportedly has not ate or drank anything all day.  She has been increasingly weak.  She has had 1 or 2 episodes of nonbloody, nonbilious emesis as well as a low-grade temperature.  There are other people in the home with similar symptoms.  Patient has had generalized weakness, and had difficulty getting into family's car today, which is significantly worse than usual.  She is unable to provide much history due to her dementia.  Denies any complaints.    Past Medical History:  Diagnosis Date   Chronic back pain    DDD (degenerative disc disease), lumbar    Dementia (HCC)    Hip fx (HCC)    Hypertension    Rheumatoid arthritis (HCC)    Spinal stenosis     Patient Active Problem List   Diagnosis Date Noted   Acute metabolic encephalopathy 06/09/2019   COVID-19 virus infection 06/09/2019   Dementia without behavioral disturbance (HCC) 06/09/2019   Bilateral lower extremity edema 01/21/2018   Lower extremity pain, bilateral 01/21/2018   Acute right lumbar radiculopathy 09/19/2017   Thigh shingles (Right) 09/19/2017   Multidermatomal herpes zoster infection (lumbar and sacral) (Right) 09/19/2017   Spondylosis without myelopathy or radiculopathy, lumbosacral region 09/19/2017   Lumbar facet syndrome (Bilateral) 09/19/2017   Abnormal MRI, lumbar spine (12/21/2013) 09/19/2017   Post herpetic neuralgia 09/12/2017   Acute encephalopathy 09/12/2017   UTI  (urinary tract infection) 09/12/2017   HTN (hypertension) 09/12/2017   Chronic pain of multiple joints 02/06/2017   At high risk for falls 06/08/2016   History of fall 06/08/2016   Major neurocognitive disorder due to multiple etiologies (HCC) 06/08/2016   Ulcer of esophagus with bleeding 11/14/2015   C. difficile diarrhea 11/13/2015   Gastroesophageal reflux disease without esophagitis 11/12/2015   Melanotic stools 11/12/2015   Essential hypertension 11/12/2015   S/P hip hemiarthroplasty 07/02/2015   Fracture of left hip requiring operative repair (HCC) 05/17/2015   HNP (herniated nucleus pulposus), lumbar 04/26/2015   Hip fracture (HCC) 04/04/2015   Chronic LBP 09/25/2013   Degeneration of intervertebral disc of lumbar region 09/25/2013   Neuritis or radiculitis due to rupture of lumbar intervertebral disc 09/25/2013   Degenerative arthritis of lumbar spine 09/25/2013   Lumbar radiculitis 09/25/2013   Chronic low back pain (Secondary Area of Pain) (Bilateral) with sciatica (Right) 09/25/2013   DDD (degenerative disc disease), lumbar 09/25/2013   Lumbar spondylosis 09/25/2013    Past Surgical History:  Procedure Laterality Date   CATARACT EXTRACTION, BILATERAL     CHOLECYSTECTOMY     HEMORRHOID SURGERY     HIP ARTHROPLASTY Left 04/05/2015   Procedure: ARTHROPLASTY BIPOLAR HIP (HEMIARTHROPLASTY);  Surgeon: Donato Heinz, MD;  Location: [facility] ORS;  Service: Orthopedics;  Laterality: Left;   Percutaneous pinning of a right femoral neck fracture      Prior to Admission medications   Medication Sig Start Date End Date Taking? Authorizing Provider  acetaminophen (TYLENOL) 325 MG tablet Take 2 tablets (650 mg  total) by mouth 2 (two) times daily as needed. At 1400 and 2200. Patient taking differently: Take 650 mg by mouth every 8 (eight) hours as needed. 06/18/19  Yes Darlin Priestly, MD  Cholecalciferol (VITAMIN D-3) 125 MCG (5000 UT) TABS Take 1 tablet by mouth daily.   Yes [provider]  Docusate Sodium (DSS) 100 MG CAPS Take 200 mg by mouth 2 (two) times daily.  11/15/15  Yes [provider]  DULoxetine (CYMBALTA) 30 MG capsule Take 30 mg by mouth daily.   Yes [provider]  gabapentin (NEURONTIN) 300 MG capsule Take 300 mg by mouth 2 (two) times daily. 11/20/17  Yes [provider]  levothyroxine (SYNTHROID) 50 MCG tablet Take 50 mcg by mouth daily before breakfast.   Yes [provider]  lisinopril (ZESTRIL) 10 MG tablet Take 10 mg by mouth daily.   Yes [provider]  memantine (NAMENDA) 10 MG tablet Take 10 mg by mouth 2 (two) times daily.    Yes [provider]  Menthol, Topical Analgesic, (BIOFREEZE) 4 % GEL Apply 1 application topically 3 (three) times daily.   Yes [provider]  mirtazapine (REMERON) 15 MG tablet Take 15 mg by mouth at bedtime.   Yes [provider]  Multiple Vitamin (MULTIVITAMIN) capsule Take 1 capsule by mouth daily.   Yes [provider]  Multiple Vitamins-Minerals (PRESERVISION AREDS 2+MULTI VIT PO) Take 1 tablet by mouth 2 (two) times daily.    Yes [provider]  oxyCODONE (OXY IR/ROXICODONE) 5 MG immediate release tablet Take 5 mg by mouth 2 (two) times daily.   Yes [provider]  pantoprazole (PROTONIX) 40 MG tablet Take 40 mg by mouth daily.   Yes [provider]  traZODone (DESYREL) 50 MG tablet Take 50 mg by mouth at bedtime.   Yes [provider]  calcium carbonate (OSCAL) 1500 (600 Ca) MG TABS tablet Take 1,500 mg by mouth 2 (two) times daily with a meal. Patient not taking: Reported on 10/31/2020    [provider]  feeding supplement, ENSURE ENLIVE, (ENSURE ENLIVE) LIQD Take 237 mLs by mouth 3 (three) times daily between meals. 06/18/19   Darlin Priestly, MD  lisinopril (ZESTRIL) 20 MG tablet TAKE 1 TABLET BY MOUTH DAILY Patient not taking: Reported on 10/31/2020 10/20/20   Corky Downs, MD     Allergies Shellfish allergy  Family History  Problem Relation Age of Onset   Breast cancer Mother    Dementia Father     Social History Social History   Tobacco Use   Smoking status: Never   Smokeless tobacco: Never  Substance Use Topics   Alcohol use: No   Drug use: No    Review of Systems  Review of Systems  Unable to perform ROS: Dementia  Constitutional:  Positive for fatigue.  Neurological:  Positive for weakness.    ____________________________________________  PHYSICAL EXAM:      VITAL SIGNS: ED Triage Vitals  Enc Vitals Group     BP 10/31/20 1812 (!) 182/96     Pulse Rate 10/31/20 1812 97     Resp 10/31/20 1812 16     Temp 10/31/20 1812 98.4 F (36.9 C)     Temp Source 10/31/20 1812 Oral     SpO2 10/31/20 1812 94 %     Weight 10/31/20 1811 119 lb 7.8 oz (54.2 kg)     Height 10/31/20 1811 5\' 4"  (1.626 m)     Head Circumference --  Peak Flow --      Pain Score --      Pain Loc --      Pain Edu? --      Excl. in GC? --      Physical Exam Vitals and nursing note reviewed.  Constitutional:      General: She is not in acute distress.    Appearance: She is well-developed.  HENT:     Head: Normocephalic and atraumatic.     Mouth/Throat:     Mouth: Mucous membranes are dry.  Eyes:     Conjunctiva/sclera: Conjunctivae normal.  Cardiovascular:     Rate and Rhythm: Normal rate and regular rhythm.     Heart sounds: Normal heart sounds.  Pulmonary:     Effort: Pulmonary effort is normal. No respiratory distress.     Breath sounds: No wheezing.  Abdominal:     General: There is no distension.  Musculoskeletal:     Cervical back: Neck supple.  Skin:    General: Skin is warm.     Capillary Refill: Capillary refill takes less than 2 seconds.     Findings: No rash.  Neurological:     Mental Status: She is alert. She is disoriented.     Motor: No abnormal muscle tone.      ____________________________________________   LABS (all labs  ordered are listed, but only abnormal results are displayed)  Labs Reviewed  LIPASE, BLOOD - Abnormal; Notable for the following components:      Result Value   Lipase 98 (*)    All other components within normal limits  COMPREHENSIVE METABOLIC PANEL - Abnormal; Notable for the following components:   Sodium 133 (*)    Glucose, Bld 121 (*)    BUN 29 (*)    Creatinine, Ser 1.08 (*)    GFR, Estimated 49 (*)    All other components within normal limits  RESP PANEL BY RT-PCR (FLU A&B, COVID) ARPGX2  GASTROINTESTINAL PANEL BY PCR, STOOL (REPLACES STOOL CULTURE)  C DIFFICILE QUICK SCREEN W PCR REFLEX    CBC  URINALYSIS, COMPLETE (UACMP) WITH MICROSCOPIC    ____________________________________________  EKG:  ________________________________________  RADIOLOGY All imaging, including plain films, CT scans, and ultrasounds, independently reviewed by me, and interpretations confirmed via formal radiology reads.  ED MD interpretation:   CT abdomen/pelvis: Air-fluid levels within the colon consistent with diarrhea, no other acute abnormality, large hiatal hernia  Official radiology report(s): CT ABDOMEN PELVIS W CONTRAST  Result Date: 10/31/2020 CLINICAL DATA:  Vomiting and diarrhea today. Patient difficult to understand more confused today. EXAM: CT ABDOMEN AND PELVIS WITH CONTRAST TECHNIQUE: Multidetector CT imaging of the abdomen and pelvis was performed using the standard protocol following bolus administration of intravenous contrast. CONTRAST:  44mL OMNIPAQUE IOHEXOL 300 MG/ML  SOLN COMPARISON:  CT, 08/01/2005 FINDINGS: Lower chest: Reticular and patchy airspace lung opacities at the dependent lung bases consistent with atelectasis and/or scarring. Large hiatal hernia, new since the prior CT. Hepatobiliary: Liver normal in size. Subcentimeter low-attenuation lesions in the left lobe consistent with cysts. No other masses. There is intra and extrahepatic bile duct dilation. Common bile  duct has a maximum diameter of 1.3 cm proximally, demonstrating normal distal tapering. Previous cholecystectomy. Duct dilation is similar to the prior CT. Pancreas: Pancreatic divisum.  No mass or inflammation. Spleen: Normal in size without focal abnormality. Adrenals/Urinary Tract: Thickened adrenal glands consistent with hyperplasia. Kidneys normal in overall size, orientation and position. Small low-density lesion, lateral midpole the  right kidney, consistent with a cyst. No other masses. Small bilateral intrarenal stones. No hydronephrosis. Ureters not well visualized in their entirety. No ureteral dilation or convincing stone. Bladder is unremarkable. Stomach/Bowel: Stomach unremarkable other than the large hiatal hernia. Small bowel and colon are normal in caliber. No wall thickening. No adjacent inflammation. Scattered colonic air-fluid levels. Air-fluid level in the rectum. There are numerous sigmoid colon diverticula without evidence of diverticulitis. Vascular/Lymphatic: Dense aortic and branch vessel atherosclerotic calcifications. No aneurysm. No enlarged lymph nodes. Reproductive: Uterus and bilateral adnexa are unremarkable. Other: No abdominal wall hernia or abnormality. No abdominopelvic ascites. Musculoskeletal: No acute fracture. No bone lesion. There are significant degenerative changes of the lumbar spine including a prominent levoscoliosis. Patient has a left hip hemiarthroplasty and 2 screws reducing an old right femoral neck fracture. Orthopedic hardware appears well seated and positioned. IMPRESSION: 1. Air-fluid levels within the colon consistent with the given history of diarrhea. No bowel wall thickening or inflammation. No other evidence of an acute abnormality within the abdomen or pelvis. 2. Large hiatal hernia new since the prior exam. 3. Chronic intra and extrahepatic bile duct dilation. 4. Extensive aortic atherosclerosis extending to branch vessels. 5. Sigmoid colon diverticula.   No diverticulitis. Electronically Signed   By: Amie Portland M.D.   On: 10/31/2020 20:56    ____________________________________________  PROCEDURES   Procedure(s) performed (including Critical Care):  Procedures  ____________________________________________  INITIAL IMPRESSION / MDM / ASSESSMENT AND PLAN / ED COURSE  As part of my medical decision making, I reviewed the following data within the electronic MEDICAL RECORD NUMBER Nursing notes reviewed and incorporated, Old chart reviewed, Notes from prior ED visits, and [place] Controlled Substance Database       *Angel Simmons was evaluated in Emergency Department on 10/31/2020 for the symptoms described in the history of present illness. She was evaluated in the context of the global COVID-19 pandemic, which necessitated consideration that the patient might be at risk for infection with the SARS-CoV-2 virus that causes COVID-19. Institutional protocols and algorithms that pertain to the evaluation of patients at risk for COVID-19 are in a state of rapid change based on information released by regulatory bodies including the CDC and federal and state organizations. These policies and algorithms were followed during the patient's care in the ED.  Some ED evaluations and interventions may be delayed as a result of limited staffing during the pandemic.*     Medical Decision Making: 87 year old female here with generalized weakness, inability to ambulate, and nausea and vomiting with diarrhea for 24 hours.  Patient is afebrile.  She is moderately dehydrated clinically.  Labs reviewed, white blood cell count is normal.  CMP shows likely dehydration with elevated BUN to creatinine ratio.  LFTs normal.  Lipase elevated at 98.  CT abdomen pelvis obtained, reviewed by me, and shows air-fluid levels throughout the colon consistent with diarrheal illness.  No apparent surgical abnormality.  Given her weakness, inability to eat or drink, and labs consistent with  dehydration, will admit for fluids and symptomatic control.  GI panel sent.  ____________________________________________  FINAL CLINICAL IMPRESSION(S) / ED DIAGNOSES  Final diagnoses:  Colitis  Dehydration     MEDICATIONS GIVEN DURING THIS VISIT:  Medications  sodium chloride 0.9 % bolus 1,000 mL (1,000 mLs Intravenous New Bag/Given 10/31/20 2044)  ondansetron (ZOFRAN) injection 4 mg (4 mg Intravenous Given 10/31/20 2046)  iohexol (OMNIPAQUE) 300 MG/ML solution 75 mL (75 mLs Intravenous Contrast Given 10/31/20 2022)  ED Discharge Orders     None        Note:  This document was prepared using Dragon voice recognition software and may include unintentional dictation errors.   Shaune Pollack, MD 10/31/20 2101

## 2020-10-31 NOTE — ED Triage Notes (Signed)
Arrives with daughter who states patient has been living at [facility] for several months.  They called daughter today and stated that patient had vomiting and diarrhea today.  Daughter reports that patient speech is difficult to understand and more confused.  Patient has history of dementia.  Seen yesterday and daughter reports that patient was more confused yesterday as well.

## 2020-11-01 DIAGNOSIS — K529 Noninfective gastroenteritis and colitis, unspecified: Secondary | ICD-10-CM | POA: Diagnosis not present

## 2020-11-01 DIAGNOSIS — R112 Nausea with vomiting, unspecified: Secondary | ICD-10-CM | POA: Diagnosis not present

## 2020-11-01 LAB — BASIC METABOLIC PANEL
Anion gap: 6 (ref 5–15)
BUN: 22 mg/dL (ref 8–23)
CO2: 27 mmol/L (ref 22–32)
Calcium: 8.8 mg/dL — ABNORMAL LOW (ref 8.9–10.3)
Chloride: 106 mmol/L (ref 98–111)
Creatinine, Ser: 0.95 mg/dL (ref 0.44–1.00)
GFR, Estimated: 57 mL/min — ABNORMAL LOW (ref >60)
Glucose, Bld: 88 mg/dL (ref 70–99)
Potassium: 3.6 mmol/L (ref 3.5–5.1)
Sodium: 139 mmol/L (ref 135–145)

## 2020-11-01 LAB — URINALYSIS, COMPLETE (UACMP) WITH MICROSCOPIC
Bilirubin Urine: NEGATIVE
Glucose, UA: NEGATIVE mg/dL
Ketones, ur: NEGATIVE mg/dL
Leukocytes,Ua: NEGATIVE
Nitrite: NEGATIVE
Protein, ur: NEGATIVE mg/dL
Specific Gravity, Urine: 1.013 (ref 1.005–1.030)
Squamous Epithelial / HPF: NONE SEEN (ref 0–5)
pH: 6 (ref 5.0–8.0)

## 2020-11-01 MED ORDER — ONDANSETRON 4 MG PO TBDP: 4.0000 mg | ORAL_TABLET | Freq: Three times a day (TID) | ORAL | 0 refills | Status: AC | PRN

## 2020-11-01 MED ORDER — ACETAMINOPHEN 325 MG PO TABS: 650.0000 mg | ORAL_TABLET | Freq: Three times a day (TID) | ORAL | Status: AC | PRN

## 2020-11-01 NOTE — ED Notes (Signed)
Pt resting comfortably with eyes closed

## 2020-11-01 NOTE — Progress Notes (Signed)
Patient ate about 50% of breakfast with feeding assistance and cuing. Drank juice, no s/s nausea, vomiting, or loose stools at this time.

## 2020-11-01 NOTE — Discharge Summary (Signed)
Physician Discharge Summary  MAAT KAFER IRS:[id number] DOB: Sep 22, 1928 DOA: 10/31/2020  PCP: Gracelyn Nurse, MD  Admit date: 10/31/2020 Discharge date: 11/01/2020  [facility]  Recommendations for Outpatient Follow-up:  Please feed with full assistance, encourage fluid intake, patient did very well with oral intake including fluid intake, and breakfast with staff assistance Please obtain BMP/CBC in one week     Discharge Condition:Stable CODE STATUS:DNR Diet recommendation: Soft diet  Brief/Interim Summary:  : Angel Simmons is a 87 y.o. female with medical history significant for severe dementia, HTN, GERD, hx of C.diff, hypothyroidism who presents from [facility] with generalized weakness, nausea, vomiting and diarrhea.   Patient unable to provide history given her severe dementia.  She was moaning,grimacing and reaching for her back during my evaluation.  She also just received Haldol prior to my arrival for agitation.  No family at bedside. I spoke with daughter over the phone and she reports that [facility] SN[facility] called her since pt was not eating well and had 1 episode each of vomiting and diarrhea. No fever that she is aware of. There were other residents at the nursing facility with similar symptoms that also presented to the ER.     ED Course: She was afebrile, intermittently hypertensive with a systolic of 180s on room air.  No leukocytosis or anemia.  Sodium of 133.  Creatinine with AKI at 1.08 from a prior of 0.90.  BG of 121.  Lipase of 98. CTA abdomen did not show any pancreatic abnormalities, has new large hiatal hernia and a fluid-filled colon consistent with diarrheal illness.    Nausea/vomiting/diarrhea  -This is most likely due to viral illness, afebrile, no leukocytosis, she is with some slight dehydration on presentation, which was treated with IV fluids, she is with no further nausea, vomiting, no diarrhea since admission, she  has a good appetite, ate 50% of her breakfast with feeding assistance and cueing, good fluid intake, so she will be discharged back to the facility with recommendation to encourage patient to increase her fluid intake, and to feed with assistance, will add as needed Zofran to her medication on discharge as needed for nausea or vomiting . -CT abdomen showing no inflammation or infection, no evidence of colitis -GI panel still pending at time of discharge   CKD stage III AAA -No evidence of AKI, renal function at baseline -Renal function at baseline, encouraged fluid intake   Large hiatal hernia -New incidental finding on CT abdomen.  Low suspicion that this is the cause of her symptoms although likely worsens her nausea and vomiting.  Continue symptomatic management as she would not be a candidate for any invasive intervention   Chronic back pain - Continue home oxycodone, gabapentin   Hypothyroidism - Continue levothyroxine   Severe dementia - Patient unable to provide any meaningful history or answer questions appropriately.  Appears to be with advanced dementia. -Continue memantine, mirtazapine, Cymbalta, trazodone   I have left a voicemail for the daughter to update her patient is being discharged back to [facility]  Discharge Diagnoses:  Principal Problem:   Colitis Active Problems:   Chronic low back pain   AKI (acute kidney injury) (HCC)   Hiatal hernia   Hypothyroidism   Dementia with behavioral disturbance [facility])    Discharge Instructions  Discharge Instructions     Discharge instructions   Complete by: As directed    Continue with soft diet, encourage feeding and fluid intake, aspiration precaution,  feeding with full assistance.   Increase activity slowly   Complete by: As directed       Allergies as of 11/01/2020       Reactions   Shellfish Allergy         Medication List     TAKE these medications    acetaminophen 325 MG tablet Commonly known as:  TYLENOL Take 2 tablets (650 mg total) by mouth every 8 (eight) hours as needed.   Biofreeze 4 % Gel Generic drug: Menthol (Topical Analgesic) Apply 1 application topically 3 (three) times daily.   calcium carbonate 1500 (600 Ca) MG Tabs tablet Commonly known as: OSCAL Take 1,500 mg by mouth 2 (two) times daily with a meal.   DSS 100 MG Caps Take 200 mg by mouth 2 (two) times daily.   DULoxetine 30 MG capsule Commonly known as: CYMBALTA Take 30 mg by mouth daily.   feeding supplement Liqd Take 237 mLs by mouth 3 (three) times daily between meals.   gabapentin 300 MG capsule Commonly known as: NEURONTIN Take 300 mg by mouth 2 (two) times daily.   levothyroxine 50 MCG tablet Commonly known as: SYNTHROID Take 50 mcg by mouth daily before breakfast.   lisinopril 10 MG tablet Commonly known as: ZESTRIL Take 10 mg by mouth daily. What changed: Another medication with the same name was removed. Continue taking this medication, and follow the directions you see here.   memantine 10 MG tablet Commonly known as: NAMENDA Take 10 mg by mouth 2 (two) times daily.   mirtazapine 15 MG tablet Commonly known as: REMERON Take 15 mg by mouth at bedtime.   multivitamin capsule Take 1 capsule by mouth daily.   ondansetron 4 MG disintegrating tablet Commonly known as: Zofran ODT Take 1 tablet (4 mg total) by mouth every 8 (eight) hours as needed for nausea or vomiting.   oxyCODONE 5 MG immediate release tablet Commonly known as: Oxy IR/ROXICODONE Take 5 mg by mouth 2 (two) times daily.   pantoprazole 40 MG tablet Commonly known as: PROTONIX Take 40 mg by mouth daily.   PRESERVISION AREDS 2+MULTI VIT PO Take 1 tablet by mouth 2 (two) times daily.   traZODone 50 MG tablet Commonly known as: DESYREL Take 50 mg by mouth at bedtime.   Vitamin D-3 125 MCG (5000 UT) Tabs Take 1 tablet by mouth daily.        Allergies  Allergen Reactions   Shellfish Allergy      Consultations: none   Procedures/Studies: CT ABDOMEN PELVIS W CONTRAST  Result Date: 10/31/2020 CLINICAL DATA:  Vomiting and diarrhea today. Patient difficult to understand more confused today. EXAM: CT ABDOMEN AND PELVIS WITH CONTRAST TECHNIQUE: Multidetector CT imaging of the abdomen and pelvis was performed using the standard protocol following bolus administration of intravenous contrast. CONTRAST:  85mL OMNIPAQUE IOHEXOL 300 MG/ML  SOLN COMPARISON:  CT, 08/01/2005 FINDINGS: Lower chest: Reticular and patchy airspace lung opacities at the dependent lung bases consistent with atelectasis and/or scarring. Large hiatal hernia, new since the prior CT. Hepatobiliary: Liver normal in size. Subcentimeter low-attenuation lesions in the left lobe consistent with cysts. No other masses. There is intra and extrahepatic bile duct dilation. Common bile duct has a maximum diameter of 1.3 cm proximally, demonstrating normal distal tapering. Previous cholecystectomy. Duct dilation is similar to the prior CT. Pancreas: Pancreatic divisum.  No mass or inflammation. Spleen: Normal in size without focal abnormality. Adrenals/Urinary Tract: Thickened adrenal glands consistent with hyperplasia. Kidneys normal in  overall size, orientation and position. Small low-density lesion, lateral midpole the right kidney, consistent with a cyst. No other masses. Small bilateral intrarenal stones. No hydronephrosis. Ureters not well visualized in their entirety. No ureteral dilation or convincing stone. Bladder is unremarkable. Stomach/Bowel: Stomach unremarkable other than the large hiatal hernia. Small bowel and colon are normal in caliber. No wall thickening. No adjacent inflammation. Scattered colonic air-fluid levels. Air-fluid level in the rectum. There are numerous sigmoid colon diverticula without evidence of diverticulitis. Vascular/Lymphatic: Dense aortic and branch vessel atherosclerotic calcifications. No aneurysm. No  enlarged lymph nodes. Reproductive: Uterus and bilateral adnexa are unremarkable. Other: No abdominal wall hernia or abnormality. No abdominopelvic ascites. Musculoskeletal: No acute fracture. No bone lesion. There are significant degenerative changes of the lumbar spine including a prominent levoscoliosis. Patient has a left hip hemiarthroplasty and 2 screws reducing an old right femoral neck fracture. Orthopedic hardware appears well seated and positioned. IMPRESSION: 1. Air-fluid levels within the colon consistent with the given history of diarrhea. No bowel wall thickening or inflammation. No other evidence of an acute abnormality within the abdomen or pelvis. 2. Large hiatal hernia new since the prior exam. 3. Chronic intra and extrahepatic bile duct dilation. 4. Extensive aortic atherosclerosis extending to branch vessels. 5. Sigmoid colon diverticula.  No diverticulitis. Electronically Signed   By: Amie Portland M.D.   On: 10/31/2020 20:56       Subjective:   Discharge Exam: Vitals:   11/01/20 0600 11/01/20 0700  BP: (!) 120/56 138/64  Pulse: 78 70  Resp: 11 15  Temp:    SpO2: 92% 93%   Vitals:   10/31/20 2300 11/01/20 0407 11/01/20 0600 11/01/20 0700  BP: 139/72 121/76 (!) 120/56 138/64  Pulse: 93 90 78 70  Resp: 18 18 11 15   Temp:      TempSrc:      SpO2: 96% 98% 92% 93%  Weight:      Height:        General: Pt is alert, awake, significantly demented, does not follow instructions or questions appropriately not in acute distress Cardiovascular: RRR, S1/S2 +, no rubs, no gallops Respiratory: CTA bilaterally, no wheezing, no rhonchi Abdominal: Soft, NT, ND, bowel sounds + Extremities: no edema, no cyanosis    The results of significant diagnostics from this hospitalization (including imaging, microbiology, ancillary and laboratory) are listed below for reference.     Microbiology: Recent Results (from the past 240 hour(s))  Resp Panel by RT-PCR (Flu A&B, Covid)  Nasopharyngeal Swab     Status: None   Collection Time: 10/31/20  9:31 PM   Specimen: Nasopharyngeal Swab; Nasopharyngeal(NP) swabs in vial transport medium  Result Value Ref Range Status   SARS Coronavirus 2 by RT PCR NEGATIVE NEGATIVE Final    Comment: (NOTE) SARS-CoV-2 target nucleic acids are NOT DETECTED.  The SARS-CoV-2 RNA is generally detectable in upper respiratory specimens during the acute phase of infection. The lowest concentration of SARS-CoV-2 viral copies this assay can detect is 138 copies/mL. A negative result does not preclude SARS-Cov-2 infection and should not be used as the sole basis for treatment or other patient management decisions. A negative result may occur with  improper specimen collection/handling, submission of specimen other than nasopharyngeal swab, presence of viral mutation(s) within the areas targeted by this assay, and inadequate number of viral copies(<138 copies/mL). A negative result must be combined with clinical observations, patient history, and epidemiological information. The expected result is Negative.  Fact Sheet for Patients:  BloggerCourse.com  Fact Sheet for Healthcare Providers:  SeriousBroker.it  This test is no t yet approved or cleared by the [place] FDA and  has been authorized for detection and/or diagnosis of SARS-CoV-2 by FDA under an Emergency Use Authorization (EUA). This EUA will remain  in effect (meaning this test can be used) for the duration of the COVID-19 declaration under Section 564(b)(1) of the Act, 21 U.S.C.section 360bbb-3(b)(1), unless the authorization is terminated  or revoked sooner.       Influenza A by PCR NEGATIVE NEGATIVE Final   Influenza B by PCR NEGATIVE NEGATIVE Final    Comment: (NOTE) The Xpert Xpress SARS-CoV-2/FLU/RSV plus assay is intended as an aid in the diagnosis of influenza from Nasopharyngeal swab specimens and should not be  used as a sole basis for treatment. Nasal washings and aspirates are unacceptable for Xpert Xpress SARS-CoV-2/FLU/RSV testing.  Fact Sheet for Patients: BloggerCourse.com  Fact Sheet for Healthcare Providers: SeriousBroker.it  This test is not yet approved or cleared by the [place] FDA and has been authorized for detection and/or diagnosis of SARS-CoV-2 by FDA under an Emergency Use Authorization (EUA). This EUA will remain in effect (meaning this test can be used) for the duration of the COVID-19 declaration under Section 564(b)(1) of the Act, 21 U.S.C. section 360bbb-3(b)(1), unless the authorization is terminated or revoked.  Performed at [facility], [address] [place], [place] [address]      Labs: BNP (last 3 results) No results for input(s): BNP in the last 8760 hours. Basic Metabolic Panel: Recent Labs  Lab 10/31/20 1816 11/01/20 0407  NA 133* 139  K 3.7 3.6  CL 100 106  CO2 25 27  GLUCOSE 121* 88  BUN 29* 22  CREATININE 1.08* 0.95  CALCIUM 9.6 8.8*   Liver Function Tests: Recent Labs  Lab 10/31/20 1816  AST 31  ALT 20  ALKPHOS 64  BILITOT 1.1  PROT 7.0  ALBUMIN 4.1   Recent Labs  Lab 10/31/20 1816  LIPASE 98*   No results for input(s): AMMONIA in the last 168 hours. CBC: Recent Labs  Lab 10/31/20 1816  WBC 9.1  HGB 12.8  HCT 38.4  MCV 96.2  PLT 178   Cardiac Enzymes: No results for input(s): CKTOTAL, CKMB, CKMBINDEX, TROPONINI in the last 168 hours. BNP: Invalid input(s): POCBNP CBG: No results for input(s): GLUCAP in the last 168 hours. D-Dimer No results for input(s): DDIMER in the last 72 hours. Hgb A1c No results for input(s): HGBA1C in the last 72 hours. Lipid Profile No results for input(s): CHOL, HDL, LDLCALC, TRIG, CHOLHDL, LDLDIRECT in the last 72 hours. Thyroid function studies No results for input(s): TSH, T4TOTAL, T3FREE, THYROIDAB in the last 72  hours.  Invalid input(s): FREET3 Anemia work up No results for input(s): VITAMINB12, FOLATE, FERRITIN, TIBC, IRON, RETICCTPCT in the last 72 hours. Urinalysis    Component Value Date/Time   COLORURINE STRAW (A) 11/01/2020 0408   APPEARANCEUR CLEAR (A) 11/01/2020 0408   APPEARANCEUR Hazy 01/03/2014 0215   LABSPEC 1.013 11/01/2020 0408   LABSPEC 1.009 01/03/2014 0215   PHURINE 6.0 11/01/2020 0408   GLUCOSEU NEGATIVE 11/01/2020 0408   GLUCOSEU Negative 01/03/2014 0215   HGBUR SMALL (A) 11/01/2020 0408   BILIRUBINUR NEGATIVE 11/01/2020 0408   BILIRUBINUR Negative 01/03/2014 0215   KETONESUR NEGATIVE 11/01/2020 0408   PROTEINUR NEGATIVE 11/01/2020 0408   NITRITE NEGATIVE 11/01/2020 0408   LEUKOCYTESUR NEGATIVE 11/01/2020 0408   LEUKOCYTESUR 3+ 01/03/2014 0215  Sepsis Labs Invalid input(s): PROCALCITONIN,  WBC,  LACTICIDVEN Microbiology Recent Results (from the past 240 hour(s))  Resp Panel by RT-PCR (Flu A&B, Covid) Nasopharyngeal Swab     Status: None   Collection Time: 10/31/20  9:31 PM   Specimen: Nasopharyngeal Swab; Nasopharyngeal(NP) swabs in vial transport medium  Result Value Ref Range Status   SARS Coronavirus 2 by RT PCR NEGATIVE NEGATIVE Final    Comment: (NOTE) SARS-CoV-2 target nucleic acids are NOT DETECTED.  The SARS-CoV-2 RNA is generally detectable in upper respiratory specimens during the acute phase of infection. The lowest concentration of SARS-CoV-2 viral copies this assay can detect is 138 copies/mL. A negative result does not preclude SARS-Cov-2 infection and should not be used as the sole basis for treatment or other patient management decisions. A negative result may occur with  improper specimen collection/handling, submission of specimen other than nasopharyngeal swab, presence of viral mutation(s) within the areas targeted by this assay, and inadequate number of viral copies(<138 copies/mL). A negative result must be combined with clinical  observations, patient history, and epidemiological information. The expected result is Negative.  Fact Sheet for Patients:  BloggerCourse.com  Fact Sheet for Healthcare Providers:  SeriousBroker.it  This test is no t yet approved or cleared by the [place] FDA and  has been authorized for detection and/or diagnosis of SARS-CoV-2 by FDA under an Emergency Use Authorization (EUA). This EUA will remain  in effect (meaning this test can be used) for the duration of the COVID-19 declaration under Section 564(b)(1) of the Act, 21 U.S.C.section 360bbb-3(b)(1), unless the authorization is terminated  or revoked sooner.       Influenza A by PCR NEGATIVE NEGATIVE Final   Influenza B by PCR NEGATIVE NEGATIVE Final    Comment: (NOTE) The Xpert Xpress SARS-CoV-2/FLU/RSV plus assay is intended as an aid in the diagnosis of influenza from Nasopharyngeal swab specimens and should not be used as a sole basis for treatment. Nasal washings and aspirates are unacceptable for Xpert Xpress SARS-CoV-2/FLU/RSV testing.  Fact Sheet for Patients: BloggerCourse.com  Fact Sheet for Healthcare Providers: SeriousBroker.it  This test is not yet approved or cleared by the [place] FDA and has been authorized for detection and/or diagnosis of SARS-CoV-2 by FDA under an Emergency Use Authorization (EUA). This EUA will remain in effect (meaning this test can be used) for the duration of the COVID-19 declaration under Section 564(b)(1) of the Act, 21 U.S.C. section 360bbb-3(b)(1), unless the authorization is terminated or revoked.  Performed at [facility], [address], [place], [place] [address]      Time coordinating discharge: Over 30 minutes  SIGNED:   Huey Bienenstock, MD  [facility] 11/01/2020, 10:00 AM Pager   If 7PM-7AM, please contact  night-coverage [email or web address] Password [email or web address]

## 2020-11-01 NOTE — TOC Transition Note (Signed)
Transition of Care [facility]) - CM/SW Discharge Note   Patient Details  Name: Angel Simmons MRN: [id number] Date of Birth: Oct 06, 1928  [facility] CM/SW Contact:  Marina Goodell Phone Number: [phone] 11/01/2020, 10:50 AM   Clinical Narrative:     Patient presents to [facility] due to vomiting and diarrhea, w/ altered mental status and difficulty speaking. Patient from [facility], main contact is daughter June Graham [phone].  Patient will discharge to [facility], Room# 77, report # [phone]) [id number].  Attending and ED Staff updated.  [name] will transport patient  to facility.  Final next level of care: Assisted Living Barriers to Discharge: ED No Barriers   Patient Goals and CMS Choice Patient states their goals for this hospitalization and ongoing recovery are:: Patient will return to [facility]      Discharge Placement                       Discharge Plan and Services In-house Referral: Clinical Social Work                                   Social Determinants of Health (SDOH) Interventions     Readmission Risk Interventions No flowsheet data found.

## 2020-11-01 NOTE — Discharge Instructions (Signed)
Continue with soft diet, encourage feeding and fluid intake, aspiration precaution, feeding with full assistance.

## 2020-11-01 NOTE — ED Notes (Signed)
Pt placed in hospital bed with bed alarm in place. Will continue to monitor.

## 2020-11-21 ENCOUNTER — Emergency Department: Payer: Medicare Other

## 2020-11-21 ENCOUNTER — Other Ambulatory Visit: Payer: Self-pay

## 2020-11-21 ENCOUNTER — Emergency Department
Admission: EM | Admit: 2020-11-21 | Discharge: 2020-11-21 | Disposition: A | Discharge status: Home / Self Care | Payer: Medicare Other | Attending: Emergency Medicine | Admitting: Emergency Medicine

## 2020-11-21 DIAGNOSIS — W06XXXA Fall from bed, initial encounter: Secondary | ICD-10-CM | POA: Insufficient documentation

## 2020-11-21 DIAGNOSIS — Z8616 Personal history of COVID-19: Secondary | ICD-10-CM | POA: Diagnosis not present

## 2020-11-21 DIAGNOSIS — S51011A Laceration without foreign body of right elbow, initial encounter: Secondary | ICD-10-CM | POA: Diagnosis not present

## 2020-11-21 DIAGNOSIS — Z79899 Other long term (current) drug therapy: Secondary | ICD-10-CM | POA: Diagnosis not present

## 2020-11-21 DIAGNOSIS — F0391 Unspecified dementia with behavioral disturbance: Secondary | ICD-10-CM | POA: Insufficient documentation

## 2020-11-21 DIAGNOSIS — E039 Hypothyroidism, unspecified: Secondary | ICD-10-CM | POA: Diagnosis not present

## 2020-11-21 DIAGNOSIS — W19XXXA Unspecified fall, initial encounter: Secondary | ICD-10-CM

## 2020-11-21 DIAGNOSIS — S59901A Unspecified injury of right elbow, initial encounter: Secondary | ICD-10-CM | POA: Diagnosis present

## 2020-11-21 DIAGNOSIS — Z23 Encounter for immunization: Secondary | ICD-10-CM | POA: Diagnosis not present

## 2020-11-21 DIAGNOSIS — S0081XA Abrasion of other part of head, initial encounter: Secondary | ICD-10-CM | POA: Diagnosis not present

## 2020-11-21 DIAGNOSIS — Z96642 Presence of left artificial hip joint: Secondary | ICD-10-CM | POA: Diagnosis not present

## 2020-11-21 LAB — CBC WITH DIFFERENTIAL/PLATELET
Abs Immature Granulocytes: 0.01 10*3/uL (ref 0.00–0.07)
Basophils Absolute: 0.1 10*3/uL (ref 0.0–0.1)
Basophils Relative: 1 %
Eosinophils Absolute: 0.1 10*3/uL (ref 0.0–0.5)
Eosinophils Relative: 2 %
HCT: 39.4 % (ref 36.0–46.0)
Hemoglobin: 12.5 g/dL (ref 12.0–15.0)
Immature Granulocytes: 0 %
Lymphocytes Relative: 32 %
Lymphs Abs: 1.8 10*3/uL (ref 0.7–4.0)
MCH: 32 pg (ref 26.0–34.0)
MCHC: 31.7 g/dL (ref 30.0–36.0)
MCV: 100.8 fL — ABNORMAL HIGH (ref 80.0–100.0)
Monocytes Absolute: 0.7 10*3/uL (ref 0.1–1.0)
Monocytes Relative: 11 %
Neutro Abs: 3.1 10*3/uL (ref 1.7–7.7)
Neutrophils Relative %: 54 %
Platelets: 209 10*3/uL (ref 150–400)
RBC: 3.91 MIL/uL (ref 3.87–5.11)
RDW: 12.4 % (ref 11.5–15.5)
WBC: 5.8 10*3/uL (ref 4.0–10.5)
nRBC: 0 % (ref 0.0–0.2)

## 2020-11-21 LAB — BASIC METABOLIC PANEL
Anion gap: 5 (ref 5–15)
BUN: 22 mg/dL (ref 8–23)
CO2: 28 mmol/L (ref 22–32)
Calcium: 9.5 mg/dL (ref 8.9–10.3)
Chloride: 103 mmol/L (ref 98–111)
Creatinine, Ser: 1.24 mg/dL — ABNORMAL HIGH (ref 0.44–1.00)
GFR, Estimated: 41 mL/min — ABNORMAL LOW (ref >60)
Glucose, Bld: 89 mg/dL (ref 70–99)
Potassium: 4.4 mmol/L (ref 3.5–5.1)
Sodium: 136 mmol/L (ref 135–145)

## 2020-11-21 LAB — URINALYSIS, ROUTINE W REFLEX MICROSCOPIC
Bilirubin Urine: NEGATIVE
Glucose, UA: NEGATIVE mg/dL
Hgb urine dipstick: NEGATIVE
Ketones, ur: NEGATIVE mg/dL
Leukocytes,Ua: NEGATIVE
Nitrite: NEGATIVE
Protein, ur: NEGATIVE mg/dL
Specific Gravity, Urine: 1.01 (ref 1.005–1.030)
pH: 6 (ref 5.0–8.0)

## 2020-11-21 MED ORDER — TETANUS-DIPHTH-ACELL PERTUSSIS 5-2.5-18.5 LF-MCG/0.5 IM SUSY
0.5000 mL | PREFILLED_SYRINGE | Freq: Once | INTRAMUSCULAR | Status: AC
  Administered 2020-11-21: 0.5 mL via INTRAMUSCULAR
  Filled 2020-11-21: qty 0.5

## 2020-11-21 MED ORDER — ACETAMINOPHEN 500 MG PO TABS: 1000.0000 mg | ORAL_TABLET | Freq: Once | ORAL | Status: DC

## 2020-11-21 MED ORDER — ACETAMINOPHEN 500 MG PO TABS
1000.0000 mg | ORAL_TABLET | Freq: Once | ORAL | Status: AC
  Administered 2020-11-21: 1000 mg via ORAL
  Filled 2020-11-21: qty 2

## 2020-11-21 NOTE — ED Provider Notes (Addendum)
[facility] Emergency Department Provider Note  ____________________________________________   Event Date/Time   First MD Initiated Contact with Patient 11/21/20 [id number]     (approximate)  I have reviewed the triage vital signs and the nursing notes.   HISTORY  Chief Complaint Fall    HPI Angel Simmons is a 87 y.o. female who comes in from [facility] for a unwitnessed fall out of bed.  Patient has a skin tear on his right elbow and hematoma to the left forehead.  No LOC.  Patient has some baseline dementia and is oriented x2.  I tried to ask her how she walks at home she states why are you asking me that.  My try to evaluate patient she states why you keep asking me questions.  I tried to explain to patient how I am her doctor and I am trying to see if there is anything that is injured from the fall but patient is refusing to answer any of my questions.  Unable to get full HPI from patient due to baseline dementia          Past Medical History:  Diagnosis Date   Chronic back pain    DDD (degenerative disc disease), lumbar    Dementia (HCC)    Hip fx (HCC)    Hypertension    Rheumatoid arthritis (HCC)    Spinal stenosis     Patient Active Problem List   Diagnosis Date Noted   Colitis 10/31/2020   AKI (acute kidney injury) (HCC) 10/31/2020   Hiatal hernia 10/31/2020   Hypothyroidism 10/31/2020   Dementia with behavioral disturbance (HCC) 10/31/2020   Acute metabolic encephalopathy 06/09/2019   COVID-19 virus infection 06/09/2019   Dementia without behavioral disturbance (HCC) 06/09/2019   Bilateral lower extremity edema 01/21/2018   Lower extremity pain, bilateral 01/21/2018   Acute right lumbar radiculopathy 09/19/2017   Thigh shingles (Right) 09/19/2017   Multidermatomal herpes zoster infection (lumbar and sacral) (Right) 09/19/2017   Spondylosis without myelopathy or radiculopathy, lumbosacral region 09/19/2017   Lumbar  facet syndrome (Bilateral) 09/19/2017   Abnormal MRI, lumbar spine (12/21/2013) 09/19/2017   Post herpetic neuralgia 09/12/2017   Acute encephalopathy 09/12/2017   UTI (urinary tract infection) 09/12/2017   HTN (hypertension) 09/12/2017   Chronic pain of multiple joints 02/06/2017   At high risk for falls 06/08/2016   History of fall 06/08/2016   Major neurocognitive disorder due to multiple etiologies (HCC) 06/08/2016   Ulcer of esophagus with bleeding 11/14/2015   C. difficile diarrhea 11/13/2015   Gastroesophageal reflux disease without esophagitis 11/12/2015   Melanotic stools 11/12/2015   Essential hypertension 11/12/2015   S/P hip hemiarthroplasty 07/02/2015   Fracture of left hip requiring operative repair (HCC) 05/17/2015   HNP (herniated nucleus pulposus), lumbar 04/26/2015   Hip fracture (HCC) 04/04/2015   Chronic low back pain 09/25/2013   Degeneration of intervertebral disc of lumbar region 09/25/2013   Neuritis or radiculitis due to rupture of lumbar intervertebral disc 09/25/2013   Degenerative arthritis of lumbar spine 09/25/2013   Lumbar radiculitis 09/25/2013   Chronic low back pain (Secondary Area of Pain) (Bilateral) with sciatica (Right) 09/25/2013   DDD (degenerative disc disease), lumbar 09/25/2013   Lumbar spondylosis 09/25/2013    Past Surgical History:  Procedure Laterality Date   CATARACT EXTRACTION, BILATERAL     CHOLECYSTECTOMY     HEMORRHOID SURGERY     HIP ARTHROPLASTY Left 04/05/2015   Procedure: ARTHROPLASTY BIPOLAR HIP (HEMIARTHROPLASTY);  Surgeon:  Donato Heinz, MD;  Location: [facility] ORS;  Service: Orthopedics;  Laterality: Left;   Percutaneous pinning of a right femoral neck fracture      Prior to Admission medications   Medication Sig Start Date End Date Taking? Authorizing Provider  acetaminophen (TYLENOL) 325 MG tablet Take 2 tablets (650 mg total) by mouth every 8 (eight) hours as needed. 11/01/20   Elgergawy, Leana Roe, MD  calcium  carbonate (OSCAL) 1500 (600 Ca) MG TABS tablet Take 1,500 mg by mouth 2 (two) times daily with a meal.    [provider]  Cholecalciferol (VITAMIN D-3) 125 MCG (5000 UT) TABS Take 1 tablet by mouth daily.    [provider]  Docusate Sodium (DSS) 100 MG CAPS Take 200 mg by mouth 2 (two) times daily.  11/15/15   [provider]  DULoxetine (CYMBALTA) 30 MG capsule Take 30 mg by mouth daily.    [provider]  feeding supplement, ENSURE ENLIVE, (ENSURE ENLIVE) LIQD Take 237 mLs by mouth 3 (three) times daily between meals. 06/18/19   Darlin Priestly, MD  gabapentin (NEURONTIN) 300 MG capsule Take 300 mg by mouth 2 (two) times daily. 11/20/17   [provider]  levothyroxine (SYNTHROID) 50 MCG tablet Take 50 mcg by mouth daily before breakfast.    [provider]  lisinopril (ZESTRIL) 10 MG tablet Take 10 mg by mouth daily.    [provider]  memantine (NAMENDA) 10 MG tablet Take 10 mg by mouth 2 (two) times daily.     [provider]  Menthol, Topical Analgesic, (BIOFREEZE) 4 % GEL Apply 1 application topically 3 (three) times daily.    [provider]  mirtazapine (REMERON) 15 MG tablet Take 15 mg by mouth at bedtime.    [provider]  Multiple Vitamin (MULTIVITAMIN) capsule Take 1 capsule by mouth daily.    [provider]  Multiple Vitamins-Minerals (PRESERVISION AREDS 2+MULTI VIT PO) Take 1 tablet by mouth 2 (two) times daily.     [provider]  ondansetron (ZOFRAN ODT) 4 MG disintegrating tablet Take 1 tablet (4 mg total) by mouth every 8 (eight) hours as needed for nausea or vomiting. 11/01/20   Elgergawy, Leana Roe, MD  oxyCODONE (OXY IR/ROXICODONE) 5 MG immediate release tablet Take 5 mg by mouth 2 (two) times daily.    [provider]  pantoprazole (PROTONIX) 40 MG tablet Take 40 mg by mouth daily.    [provider]  traZODone (DESYREL) 50 MG tablet Take 50 mg by mouth at  bedtime.    [provider]    Allergies Shellfish allergy  Family History  Problem Relation Age of Onset   Breast cancer Mother    Dementia Father     Social History Social History   Tobacco Use   Smoking status: Never   Smokeless tobacco: Never  Substance Use Topics   Alcohol use: No   Drug use: No      Review of Systems Unable to get full review of systems due to baseline dementia ________________________   PHYSICAL EXAM:  VITAL SIGNS: ED Triage Vitals  Enc Vitals Group     BP 11/21/20 0409 (!) 159/70     Pulse Rate 11/21/20 0409 61     Resp 11/21/20 0409 18     Temp 11/21/20 0409 (!) 97.4 F (36.3 C)     Temp Source 11/21/20 0409 Oral     SpO2 11/21/20 0409 97 %  Weight 11/21/20 0410 121 lb 4.1 oz (55 kg)     Height --      Head Circumference --      Peak Flow --      Pain Score --      Pain Loc --      Pain Edu? --      Excl. in GC? --     Constitutional: Alert and oriented x2  Eyes: Conjunctivae are normal. EOMI. Head: Abrasion to the forehead Nose: No congestion/rhinnorhea. Mouth/Throat: Mucous membranes are moist.   Neck: No stridor. Trachea Midline. FROM Cardiovascular: Normal rate, regular rhythm. Grossly normal heart sounds.  Good peripheral circulation. Respiratory: Normal respiratory effort.  No retractions. Lungs CTAB. Gastrointestinal: Soft and nontender. No distention. No abdominal bruits.   Musculoskeletal: Patient is unwilling to tell me where she is having pain.  But she was able to spontaneously move her legs a little bit in her arms.  Neurologic:  Normal speech and language. No gross focal neurologic deficits are appreciated.  Skin:  Skin is warm, dry and intact. No rash noted. Psychiatric: Mood and affect are normal. Speech and behavior are normal. GU: Deferred   ____________________________________________   LABS (all labs ordered are listed, but only abnormal results are displayed)  Labs Reviewed  CBC WITH  DIFFERENTIAL/PLATELET - Abnormal; Notable for the following components:      Result Value   MCV 100.8 (*)    All other components within normal limits  BASIC METABOLIC PANEL - Abnormal; Notable for the following components:   Creatinine, Ser 1.24 (*)    GFR, Estimated 41 (*)    All other components within normal limits  URINALYSIS, ROUTINE W REFLEX MICROSCOPIC   ____________________________________________ RADIOLOGY Vela Prose, personally viewed and evaluated these images (plain radiographs) as part of my medical decision making, as well as reviewing the written report by the radiologist.  ED MD interpretation: No fracture  Official radiology report(s): DG Chest 2 View  Result Date: 11/21/2020 CLINICAL DATA:  Fall. EXAM: CHEST - 2 VIEW COMPARISON:  08/30/2020 FINDINGS: Cardiomegaly with bulky mitral annular calcification. Diffuse atheromatous plaque. Chronic elevation of the left diaphragm with increased density at the left base attributed to scarring. There is no edema, consolidation, effusion, or pneumothorax. IMPRESSION: Stable exam.  No acute finding. Electronically Signed   By: Marnee Spring M.D.   On: 11/21/2020 05:51   DG Forearm Right  Result Date: 11/21/2020 CLINICAL DATA:  Unwitnessed fall with skin tear to the right elbow EXAM: RIGHT FOREARM - 2 VIEW COMPARISON:  None. FINDINGS: No opaque foreign body. No acute fracture or dislocation. Generalized osteopenia. Atherosclerotic calcification IMPRESSION: No acute finding. Electronically Signed   By: Marnee Spring M.D.   On: 11/21/2020 05:11   CT Head Wo Contrast  Result Date: 11/21/2020 CLINICAL DATA:  Minor head trauma.  Fall from bed EXAM: CT HEAD WITHOUT CONTRAST TECHNIQUE: Contiguous axial images were obtained from the base of the skull through the vertex without intravenous contrast. COMPARISON:  08/30/2020 FINDINGS: Brain: No evidence of acute infarction, hemorrhage, hydrocephalus, extra-axial collection or mass  lesion/mass effect. Generalized atrophy and chronic small vessel ischemia. Vascular: No hyperdense vessel or unexpected calcification. Skull: No acute fracture Sinuses/Orbits: Bilateral cataract resection IMPRESSION: No evidence of acute intracranial injury. Electronically Signed   By: Marnee Spring M.D.   On: 11/21/2020 05:14   CT Cervical Spine Wo Contrast  Result Date: 11/21/2020 CLINICAL DATA:  87 year old female with history of trauma from a fall  out of bed. Neck pain. EXAM: CT CERVICAL SPINE WITHOUT CONTRAST TECHNIQUE: Multidetector CT imaging of the cervical spine was performed without intravenous contrast. Multiplanar CT image reconstructions were also generated. COMPARISON:  CT the cervical spine 08/30/2020 multilevel degenerative disc disease. FINDINGS: Alignment: Exaggeration of normal cervical lordosis centered at the level of C3, likely chronic and/or positional. Alignment is otherwise anatomic. Skull base and vertebrae: No acute fracture. No primary bone lesion or focal pathologic process. Large right mastoid effusion. Soft tissues and spinal canal: No prevertebral fluid or swelling. No visible canal hematoma. Disc levels: Multilevel degenerative disc disease, most pronounced at C5-C6 and C6-C7. Moderate to severe multilevel facet arthropathy. Upper chest: Unremarkable. Other: None. IMPRESSION: 1. No evidence of significant acute traumatic injury to the cervical spine. 2. Large right mastoid effusion which appears to be chronic and is similar to prior study from 08/30/2020. 3. Multilevel degenerative disc disease and cervical spondylosis, as above. Electronically Signed   By: Trudie Reed M.D.   On: 11/21/2020 05:49   DG Humerus Right  Result Date: 11/21/2020 CLINICAL DATA:  Fall with skin tear at the elbow EXAM: RIGHT HUMERUS - 2+ VIEW COMPARISON:  08/20/2020 FINDINGS: Remote humeral neck fracture with completed healing. No acute fracture or dislocation. Glenohumeral degenerative joint  narrowing and spurring. 6-7 mm AC joint distance which measures similar to prior. Generalized osteopenia. IMPRESSION: No acute finding. Electronically Signed   By: Marnee Spring M.D.   On: 11/21/2020 05:12   DG Hip Unilat W or Wo Pelvis 2-3 Views Left  Result Date: 11/21/2020 CLINICAL DATA:  Fall from bed EXAM: DG HIP (WITH OR WITHOUT PELVIS) 2-3V RIGHT; DG HIP (WITH OR WITHOUT PELVIS) 2-3V LEFT COMPARISON:  08/30/2020 FINDINGS: Left hip hemiarthroplasty that is located and well seated. Cannulated right femoral neck screws which are unremarkable. No acute fracture or diastasis seen. Generalized osteopenia. IMPRESSION: No evidence of pelvic or proximal hip fracture. Electronically Signed   By: Marnee Spring M.D.   On: 11/21/2020 05:52   DG Hip Unilat W or Wo Pelvis 2-3 Views Right  Result Date: 11/21/2020 CLINICAL DATA:  Fall from bed EXAM: DG HIP (WITH OR WITHOUT PELVIS) 2-3V RIGHT; DG HIP (WITH OR WITHOUT PELVIS) 2-3V LEFT COMPARISON:  08/30/2020 FINDINGS: Left hip hemiarthroplasty that is located and well seated. Cannulated right femoral neck screws which are unremarkable. No acute fracture or diastasis seen. Generalized osteopenia. IMPRESSION: No evidence of pelvic or proximal hip fracture. Electronically Signed   By: Marnee Spring M.D.   On: 11/21/2020 05:52    ____________________________________________   PROCEDURES  Procedure(s) performed (including Critical Care):  Procedures   ____________________________________________   INITIAL IMPRESSION / ASSESSMENT AND PLAN / ED COURSE  Angel Simmons was evaluated in Emergency Department on 11/21/2020 for the symptoms described in the history of present illness. She was evaluated in the context of the global COVID-19 pandemic, which necessitated consideration that the patient might be at risk for infection with the SARS-CoV-2 virus that causes COVID-19. Institutional protocols and algorithms that pertain to the evaluation of patients at  risk for COVID-19 are in a state of rapid change based on information released by regulatory bodies including the CDC and federal and state organizations. These policies and algorithms were followed during the patient's care in the ED.    Patient presents with most likely mechanical fall but given her dementia it is hard to know exactly what happened.  CT head ordered evaluate for intercranial hemorrhage, CT cervical to evaluate for  cervical fracture, x-rays to evaluate for fractures.  Is really difficult to tell where she is having pain.  I did call the facility who states that she normally ambulates with a walker but has to hunch over to walk.  She stated that the roommate stated that she rolled out of bed and called them immediately therefore patient was not on the ground a long time.  She was acting a little bit more aggressive with them which supported the reason they wanted her to be evaluated given she hit her head.  Imaging was reassuring.  Attempted to ambulate patient but she is complaining of bilateral upper thigh pain.  We will give some Tylenol and try to ambulate later.  Does not seem to have any chest wall tenderness or abdominal tenderness on my reassessment.  Daughter is on her way to see her patient handed off to oncoming team pending ambulation  Was able to ambulate and appears at baseline per daughter.  She reports that she is some chronic leg pain from degenerative disc disease in her back.     ____________________________________________   FINAL CLINICAL IMPRESSION(S) / ED DIAGNOSES   Final diagnoses:  Fall, initial encounter      MEDICATIONS GIVEN DURING THIS VISIT:  Medications  acetaminophen (TYLENOL) tablet 1,000 mg (1,000 mg Oral Not Given 11/21/20 0502)  acetaminophen (TYLENOL) tablet 1,000 mg (has no administration in time range)  Tdap (BOOSTRIX) injection 0.5 mL (0.5 mLs Intramuscular Given 11/21/20 0541)     ED Discharge Orders     None        Note:   This document was prepared using Dragon voice recognition software and may include unintentional dictation errors.    Concha Se, MD 11/21/20 1601    Concha Se, MD 11/21/20 [phone]

## 2020-11-21 NOTE — ED Notes (Signed)
Pt's provided with peri care and wet depends removed and new one placed, a pair of hospital scrub pants placed on pt and pt assisted into the wheelchair Daughter given discharge instructions and states that she will give to [facility] to make a copy, daughter also handed the tetanus information and card

## 2020-11-21 NOTE — ED Triage Notes (Signed)
Pt to ED via [facility] from [facility]. Per EMS pt had fall out of bed and had unwitnessed fall. Per EMS pt with skin tear to R elbow and hematoma to L forehead. Per EMS no LOC at this time.   Per EMS pt with hx of dementia.

## 2020-11-21 NOTE — ED Notes (Signed)
Unable to ambulate patient.  With full assist pt sit up at bedside.  Leans over to return back to bed.  With full assist pt stands but then screams out that "legs are hurting" and moves herself backwards and into bed.  Pt refuses to take forward steps at this time.

## 2020-11-21 NOTE — ED Triage Notes (Signed)
Pt c/o right arm pain at this time.  Pain worse with palpation to right arm.  Pt A&O x2 at this time.

## 2020-11-21 NOTE — Discharge Instructions (Addendum)
Work-up was reassuring.  Take Tylenol 1 g every 8 hours to help with pain.  Return to the ER for any other concerns.

## 2020-11-21 NOTE — ED Notes (Signed)
Unable to get e-signature to work

## 2020-12-13 ENCOUNTER — Other Ambulatory Visit: Payer: Self-pay

## 2020-12-13 ENCOUNTER — Emergency Department
Admission: EM | Admit: 2020-12-13 | Discharge: 2020-12-13 | Disposition: A | Discharge status: Home / Self Care | Payer: Medicare Other | Attending: Emergency Medicine | Admitting: Emergency Medicine

## 2020-12-13 ENCOUNTER — Emergency Department: Payer: Medicare Other

## 2020-12-13 DIAGNOSIS — F0391 Unspecified dementia with behavioral disturbance: Secondary | ICD-10-CM | POA: Diagnosis present

## 2020-12-13 DIAGNOSIS — Z79899 Other long term (current) drug therapy: Secondary | ICD-10-CM | POA: Insufficient documentation

## 2020-12-13 DIAGNOSIS — Z96642 Presence of left artificial hip joint: Secondary | ICD-10-CM | POA: Insufficient documentation

## 2020-12-13 DIAGNOSIS — Z8616 Personal history of COVID-19: Secondary | ICD-10-CM | POA: Diagnosis not present

## 2020-12-13 DIAGNOSIS — W19XXXA Unspecified fall, initial encounter: Secondary | ICD-10-CM | POA: Diagnosis not present

## 2020-12-13 DIAGNOSIS — E039 Hypothyroidism, unspecified: Secondary | ICD-10-CM | POA: Insufficient documentation

## 2020-12-13 DIAGNOSIS — I10 Essential (primary) hypertension: Secondary | ICD-10-CM | POA: Diagnosis not present

## 2020-12-13 LAB — BASIC METABOLIC PANEL
Anion gap: 9 (ref 5–15)
BUN: 26 mg/dL — ABNORMAL HIGH (ref 8–23)
CO2: 28 mmol/L (ref 22–32)
Calcium: 9.4 mg/dL (ref 8.9–10.3)
Chloride: 102 mmol/L (ref 98–111)
Creatinine, Ser: 1.48 mg/dL — ABNORMAL HIGH (ref 0.44–1.00)
GFR, Estimated: 33 mL/min — ABNORMAL LOW (ref >60)
Glucose, Bld: 114 mg/dL — ABNORMAL HIGH (ref 70–99)
Potassium: 3.4 mmol/L — ABNORMAL LOW (ref 3.5–5.1)
Sodium: 139 mmol/L (ref 135–145)

## 2020-12-13 LAB — CBC
HCT: 36.9 % (ref 36.0–46.0)
Hemoglobin: 12.3 g/dL (ref 12.0–15.0)
MCH: 32.3 pg (ref 26.0–34.0)
MCHC: 33.3 g/dL (ref 30.0–36.0)
MCV: 96.9 fL (ref 80.0–100.0)
Platelets: 212 10*3/uL (ref 150–400)
RBC: 3.81 MIL/uL — ABNORMAL LOW (ref 3.87–5.11)
RDW: 12.7 % (ref 11.5–15.5)
WBC: 7.7 10*3/uL (ref 4.0–10.5)
nRBC: 0 % (ref 0.0–0.2)

## 2020-12-13 NOTE — ED Provider Notes (Signed)
[facility] Emergency Department Provider Note  ____________________________________________   Event Date/Time   First MD Initiated Contact with Patient 12/13/20 1637     (approximate)  I have reviewed the triage vital signs and the nursing notes.   HISTORY  Chief Complaint Fall    HPI Angel Simmons is a 87 y.o. female who comes in from [facility] for an unwitnessed fall. According to son in law patient was seen by himself around 49 AM.  She usually finishes lunch around 1 PM so she could not been down for more than 3 hours at most.  According to the facility she had unwitnessed fall and they were concerned that she might be hypotensive so had her come to the ER to be evaluated.  Patient is very pleasant and is oriented x2 and denies any concerns.  There is concern that she might have pain in her left arm but patient's blood pressure cuff went off on that arm and she denies any pain.  There is no swelling or deformity noted and she has no tenderness upon my examination.  Patient denies any other concerns.  History is limited due to patient's baseline dementia.          Past Medical History:  Diagnosis Date   Chronic back pain    DDD (degenerative disc disease), lumbar    Dementia (HCC)    Hip fx (HCC)    Hypertension    Rheumatoid arthritis (HCC)    Spinal stenosis     Patient Active Problem List   Diagnosis Date Noted   Colitis 10/31/2020   AKI (acute kidney injury) (HCC) 10/31/2020   Hiatal hernia 10/31/2020   Hypothyroidism 10/31/2020   Dementia with behavioral disturbance (HCC) 10/31/2020   Acute metabolic encephalopathy 06/09/2019   COVID-19 virus infection 06/09/2019   Dementia without behavioral disturbance (HCC) 06/09/2019   Bilateral lower extremity edema 01/21/2018   Lower extremity pain, bilateral 01/21/2018   Acute right lumbar radiculopathy 09/19/2017   Thigh shingles (Right) 09/19/2017   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 difficile diarrhea 11/13/2015   Gastroesophageal reflux disease without esophagitis 11/12/2015   Melanotic stools 11/12/2015   Essential hypertension 11/12/2015   S/P hip hemiarthroplasty 07/02/2015   Fracture of left hip requiring operative repair (HCC) 05/17/2015   HNP (herniated nucleus pulposus), lumbar 04/26/2015   Hip fracture (HCC) 04/04/2015   Chronic low back pain 09/25/2013   Degeneration of intervertebral disc of lumbar region 09/25/2013   Neuritis or radiculitis due to rupture of lumbar intervertebral disc 09/25/2013   Degenerative arthritis of lumbar spine 09/25/2013   Lumbar radiculitis 09/25/2013   Chronic low back pain (Secondary Area of Pain) (Bilateral) with sciatica (Right) 09/25/2013   DDD (degenerative disc disease), lumbar 09/25/2013   Lumbar spondylosis 09/25/2013    Past Surgical History:  Procedure Laterality Date   CATARACT EXTRACTION, BILATERAL     CHOLECYSTECTOMY  HEMORRHOID SURGERY     HIP ARTHROPLASTY Left 04/05/2015   Procedure: ARTHROPLASTY BIPOLAR HIP (HEMIARTHROPLASTY);  Surgeon: Donato Heinz, MD;  Location: [facility] ORS;  Service: Orthopedics;  Laterality: Left;   Percutaneous pinning of a right femoral neck fracture      Prior to Admission medications   Medication Sig Start Date End Date Taking? Authorizing Provider   acetaminophen (TYLENOL) 325 MG tablet Take 2 tablets (650 mg total) by mouth every 8 (eight) hours as needed. 11/01/20   Elgergawy, Leana Roe, MD  calcium carbonate (OSCAL) 1500 (600 Ca) MG TABS tablet Take 1,500 mg by mouth 2 (two) times daily with a meal.    [provider]  Cholecalciferol (VITAMIN D-3) 125 MCG (5000 UT) TABS Take 1 tablet by mouth daily.    [provider]  Docusate Sodium (DSS) 100 MG CAPS Take 200 mg by mouth 2 (two) times daily.  11/15/15   [provider]  DULoxetine (CYMBALTA) 30 MG capsule Take 30 mg by mouth daily.    [provider]  feeding supplement, ENSURE ENLIVE, (ENSURE ENLIVE) LIQD Take 237 mLs by mouth 3 (three) times daily between meals. 06/18/19   Darlin Priestly, MD  gabapentin (NEURONTIN) 300 MG capsule Take 300 mg by mouth 2 (two) times daily. 11/20/17   [provider]  levothyroxine (SYNTHROID) 50 MCG tablet Take 50 mcg by mouth daily before breakfast.    [provider]  lisinopril (ZESTRIL) 10 MG tablet Take 10 mg by mouth daily.    [provider]  memantine (NAMENDA) 10 MG tablet Take 10 mg by mouth 2 (two) times daily.     [provider]  Menthol, Topical Analgesic, (BIOFREEZE) 4 % GEL Apply 1 application topically 3 (three) times daily.    [provider]  mirtazapine (REMERON) 15 MG tablet Take 15 mg by mouth at bedtime.    [provider]  Multiple Vitamin (MULTIVITAMIN) capsule Take 1 capsule by mouth daily.    [provider]  Multiple Vitamins-Minerals (PRESERVISION AREDS 2+MULTI VIT PO) Take 1 tablet by mouth 2 (two) times daily.     [provider]  ondansetron (ZOFRAN ODT) 4 MG disintegrating tablet Take 1 tablet (4 mg total) by mouth every 8 (eight) hours as needed for nausea or vomiting. 11/01/20   Elgergawy, Leana Roe, MD  oxyCODONE (OXY IR/ROXICODONE) 5 MG immediate release tablet Take 5 mg by mouth 2 (two) times daily.    [provider]  pantoprazole (PROTONIX) 40 MG tablet Take 40 mg by mouth daily.    [provider]  traZODone (DESYREL) 50 MG tablet Take 50 mg by mouth at bedtime.    [provider]    Allergies Shellfish allergy  Family History  Problem Relation Age of Onset   Breast cancer Mother    Dementia Father     Social History Social History   Tobacco Use   Smoking status: Never   Smokeless tobacco: Never  Substance Use Topics   Alcohol use: No   Drug use: No      Review of Systems Denies any concerns but somewhat limited due to patient's dementia. ____________________________________________   PHYSICAL EXAM:  VITAL SIGNS: ED Triage Vitals  Enc Vitals Group     BP 12/13/20 1521 (!) 147/70     Pulse Rate 12/13/20 1521 99     Resp 12/13/20 1521 18     Temp 12/13/20 1521 98.2 F (36.8 C)     Temp  Source 12/13/20 1521 Oral     SpO2 12/13/20 1521 96 %     Weight 12/13/20 1522 123 lb 7.3 oz (56 kg)     Height 12/13/20 1522 5' (1.524 m)     Head Circumference --      Peak Flow --      Pain Score --      Pain Loc --      Pain Edu? --      Excl. in GC? --     Constitutional: Alert and oriented x2 Well appearing and in no acute distress. Eyes: Conjunctivae are normal. EOMI. Head: Atraumatic. Nose: No congestion/rhinnorhea. Mouth/Throat: Mucous membranes are moist.   Neck: No stridor. Trachea Midline. FROM Cardiovascular: Normal rate, regular rhythm. Grossly normal heart sounds.  Good peripheral circulation. Respiratory: Normal respiratory effort.  No retractions. Lungs CTAB. Gastrointestinal: Soft and nontender. No distention. No abdominal bruits.  Musculoskeletal: No lower extremity tenderness nor edema.  No joint effusions.  Patient moves all extremities.  Denies any pain.  No obvious trauma noted. Neurologic:  Normal speech and language. No gross focal neurologic deficits are appreciated.  Skin:  Skin is warm, dry and intact. No rash noted. Psychiatric: Mood  and affect are normal. Speech and behavior are normal. GU: Deferred   ____________________________________________   LABS (all labs ordered are listed, but only abnormal results are displayed)  Labs Reviewed  BASIC METABOLIC PANEL - Abnormal; Notable for the following components:      Result Value   Potassium 3.4 (*)    Glucose, Bld 114 (*)    BUN 26 (*)    Creatinine, Ser 1.48 (*)    GFR, Estimated 33 (*)    All other components within normal limits  CBC - Abnormal; Notable for the following components:   RBC 3.81 (*)    All other components within normal limits  URINALYSIS, COMPLETE (UACMP) WITH MICROSCOPIC   ____________________________________________   ED ECG REPORT I, Concha Se, the attending physician, personally viewed and interpreted this ECG.  Normal sinus no st elevation, no twi, bifasc block unchanged.  ____________________________________________  RADIOLOGY   Official radiology report(s): CT HEAD WO CONTRAST ( )  Result Date: 12/13/2020 CLINICAL DATA:  Head trauma, minor (Age >= 52) EXAM: CT HEAD WITHOUT CONTRAST TECHNIQUE: Contiguous axial images were obtained from the base of the skull through the vertex without intravenous contrast. COMPARISON:  Head CT 11/21/2020 FINDINGS: Brain: No evidence of acute infarction, hemorrhage, hydrocephalus, extra-axial collection or mass lesion/mass effect.The ventricles are unchanged in size.Confluent periventricular and subcortical white matter hypoattenuation, which is nonspecific but likely sequela of chronic small vessel ischemic disease.Moderate cerebral atrophy Vascular: No hyperdense vessel or unexpected calcification. Skull: Chronic right mastoid effusion. No osseous changes since the prior exam. Sinuses/Orbits: No acute findings.  Bilateral cataract surgeries. Other: Unchanged left-sided scalp lipoma. IMPRESSION: No acute intracranial abnormality. Unchanged sequela of chronic small vessel ischemic disease and cerebral  atrophy. Electronically Signed   By: Caprice Renshaw   On: 12/13/2020 17:43   CT Cervical Spine Wo Contrast  Result Date: 12/13/2020 CLINICAL DATA:  Neck trauma (Age >= 65y), fall EXAM: CT CERVICAL SPINE WITHOUT CONTRAST TECHNIQUE: Multidetector CT imaging of the cervical spine was performed without intravenous contrast. Multiplanar CT image reconstructions were also generated. COMPARISON:  Cervical spine CT 11/21/2020 FINDINGS: Alignment: Unchanged grade 1 anterolisthesis at C4-C5 and C6-C7. Skull base and vertebrae: No acute fracture. No primary bone lesion or focal pathologic process. Soft tissues and spinal canal: No prevertebral  fluid or swelling. No visible canal hematoma. Disc levels: Unchanged severe degenerative disc disease at C5-C6 and C6-C7. Unchanged moderate multilevel facet arthropathy. Unchanged severe degeneration of the C1-C2 articulation.no large disc herniation, significant spinal canal or neuroforaminal narrowing. Upper chest: Negative. Other: Chronic right mastoid effusion. IMPRESSION: No acute cervical spine fracture. Unchanged multilevel degenerative disc disease and facet arthropathy as described above. Unchanged chronic right mastoid effusion. Electronically Signed   By: Caprice Renshaw   On: 12/13/2020 17:47    ____________________________________________   PROCEDURES  Procedure(s) performed (including Critical Care):  Procedures   ____________________________________________   INITIAL IMPRESSION / ASSESSMENT AND PLAN / ED COURSE  Angel Simmons was evaluated in Emergency Department on 12/13/2020 for the symptoms described in the history of present illness. She was evaluated in the context of the global COVID-19 pandemic, which necessitated consideration that the patient might be at risk for infection with the SARS-CoV-2 virus that causes COVID-19. Institutional protocols and algorithms that pertain to the evaluation of patients at risk for COVID-19 are in a state of rapid change  based on information released by regulatory bodies including the CDC and federal and state organizations. These policies and algorithms were followed during the patient's care in the ED.     Patient is a well-appearing 86 year old who comes in for unwitnessed fall.  Concern for hypotensive at the facility but patient is normotensive here multiple checks.  Labs were ordered to evaluate for Electra imbalance, AKI.  Ordered CT head evaluate for any cranial hemorrhage, CT cervical evaluate for cervical fracture although there is no evidence of any injuries given her dementia we will just rule this out.  Labs are reassuring.  Slightly dehydrated.  CT scans are negative.  Patient denies any other pain on recheck.  At this time patient would like to be discharged home.  Son-in-law was updated      ____________________________________________   FINAL CLINICAL IMPRESSION(S) / ED DIAGNOSES   Final diagnoses:  Fall, initial encounter  Dementia with behavioral disturbance, unspecified dementia type (HCC)      MEDICATIONS GIVEN DURING THIS VISIT:  Medications - No data to display   ED Discharge Orders     None        Note:  This document was prepared using Dragon voice recognition software and may include unintentional dictation errors.    Concha Se, MD 12/13/20 [facility]

## 2020-12-13 NOTE — ED Triage Notes (Signed)
Pt to ED [facility] from [facility] for unwitnessed fall. Staff told EMS hypotensive, EMS had VSS Pt disoriented x3, cannot recall events  Pt c.o left arm pain intermittently when asked. No swelling or deformity noted

## 2020-12-13 NOTE — Discharge Instructions (Addendum)
Work-up was reassuring.  Labs showed slight dehydration which she should drink plenty of fluid for otherwise CT scans were negative and she was moving all her extremities and denies any other pain in her extremities.

## 2021-07-12 DEATH — deceased

## 2023-06-04 IMAGING — CT CT ABD-PELV W/ CM
2 of 5 series · 15 of 46 positions shown, 17 images · IV contrast (omnipaque)
Comparison: CT, 08/01/2005

CLINICAL DATA: Vomiting and diarrhea today. Patient difficult to
understand more confused today.

EXAM:
CT ABDOMEN AND PELVIS WITH CONTRAST
TECHNIQUE: Multidetector CT imaging of the abdomen and pelvis was performed
using the standard protocol following bolus administration of
intravenous contrast.
CONTRAST:  75mL OMNIPAQUE IOHEXOL 300 MG/ML  SOLN

[Series 2: routine abd/pel with (person_name) · axial · 0.70mm/px · z∈[-573,-213]mm · 12 of 82 slices shown, 14 images]
[im 5/82  soft-tissue]
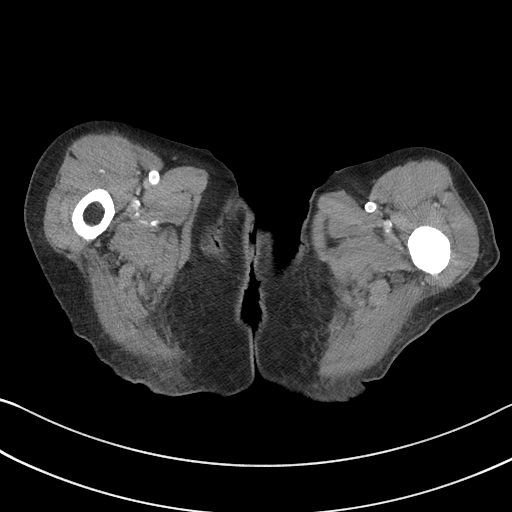
[im 5/82  bone]
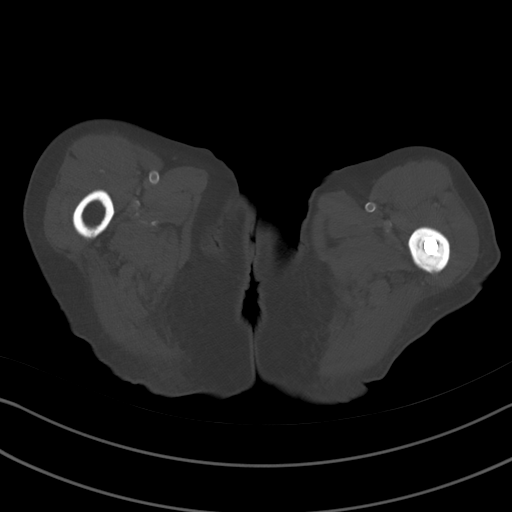
[im 13/82  soft-tissue]
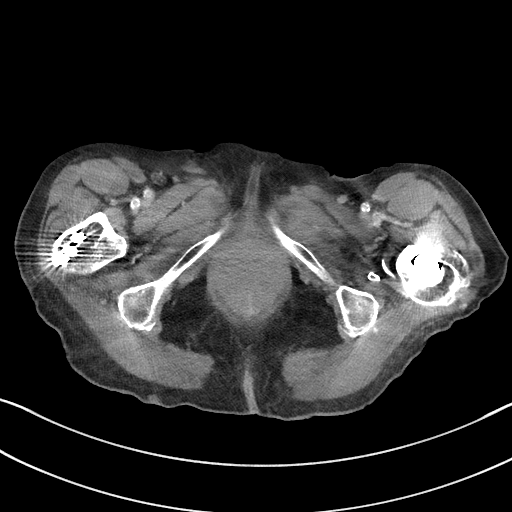
[im 17/82  soft-tissue]
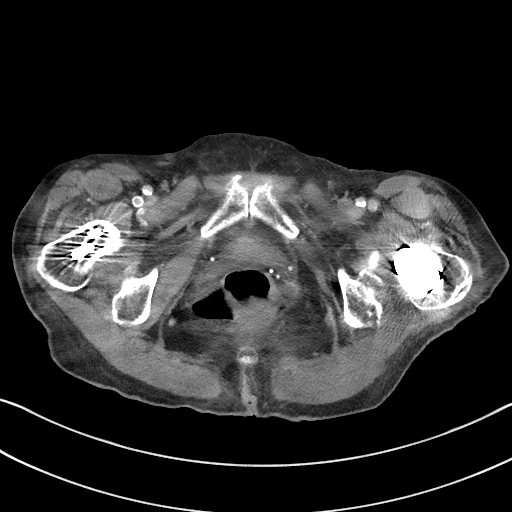
[im 25/82  soft-tissue]
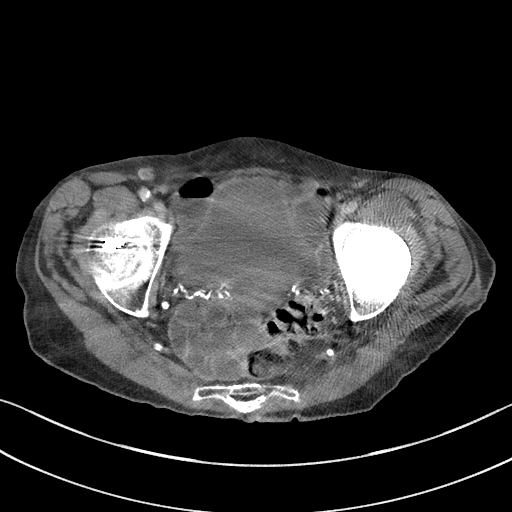
[im 33/82  soft-tissue]
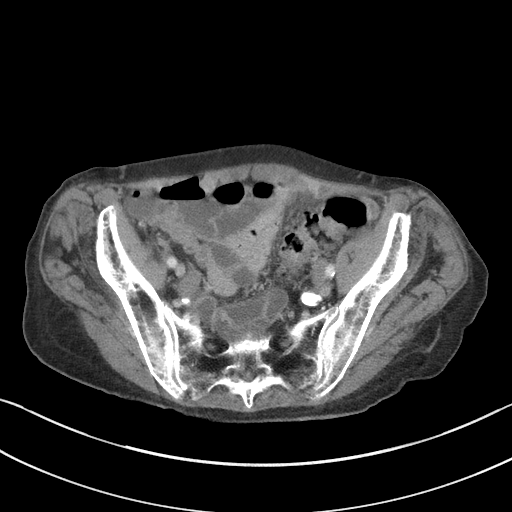
[im 37/82  soft-tissue]
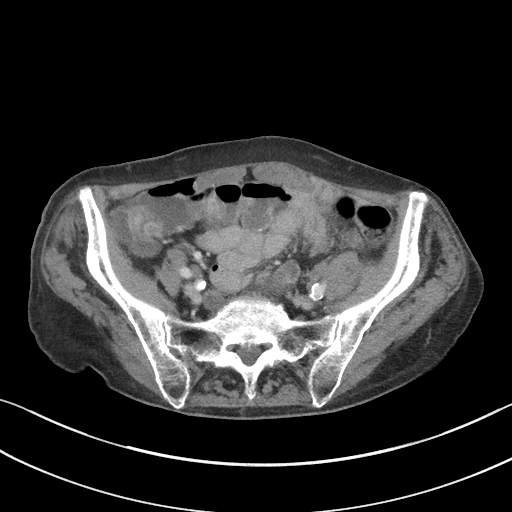
[im 45/82  soft-tissue]
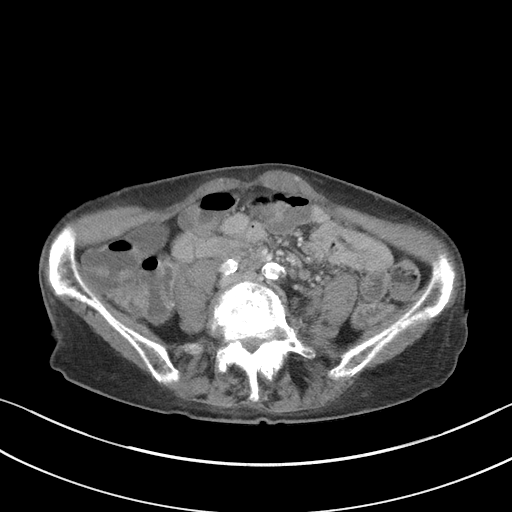
[im 49/82  soft-tissue]
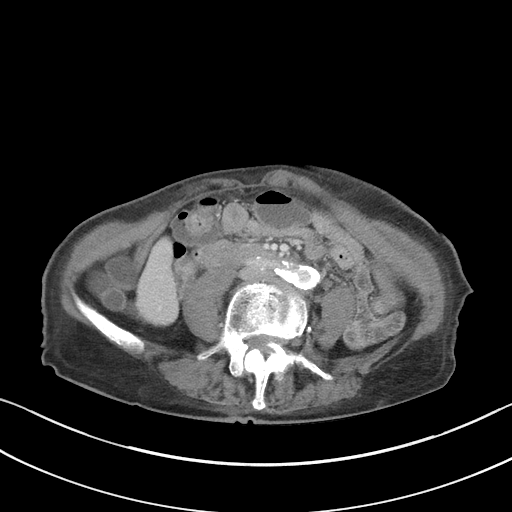
[im 57/82  soft-tissue]
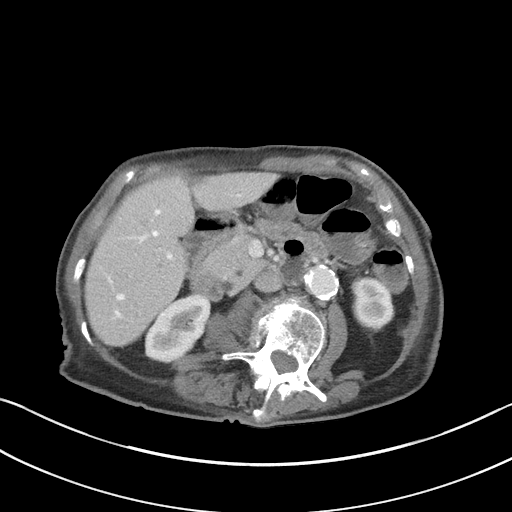
[im 57/82  bone]
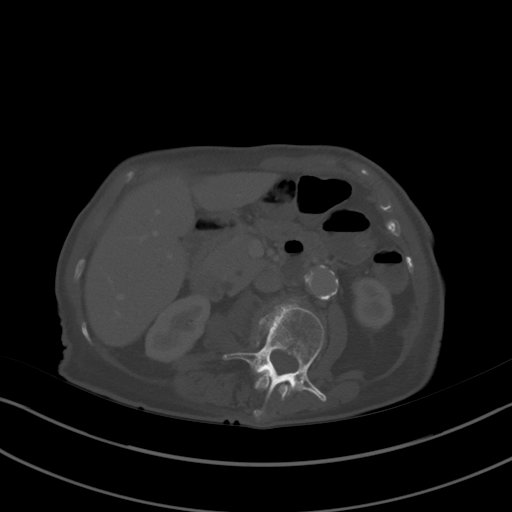
[im 65/82  soft-tissue]
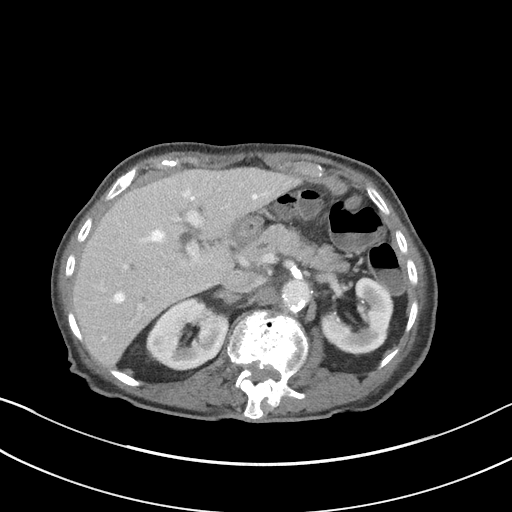
[im 69/82  soft-tissue]
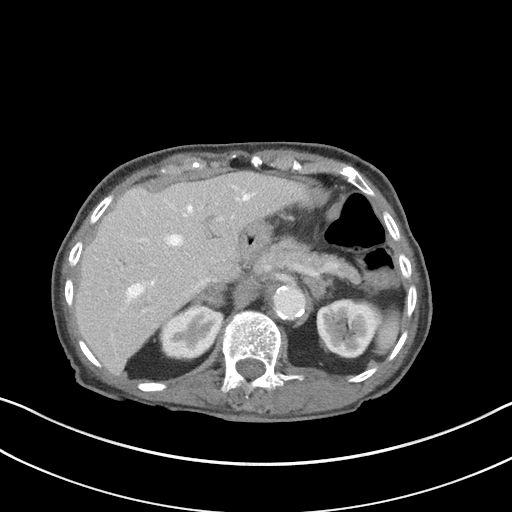
[im 77/82  soft-tissue]
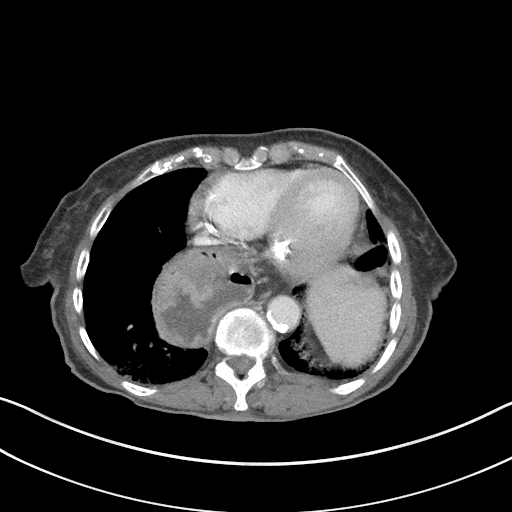

[Series 5: coronal st · coronal · 0.69mm/px · 3 of 76 slices shown]
[im 26/76  soft-tissue]
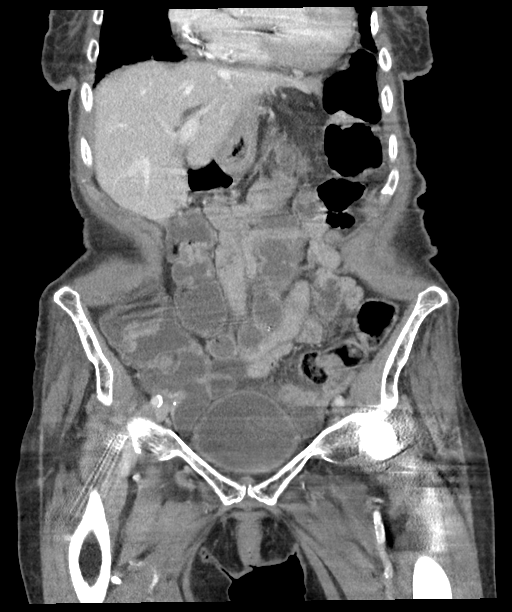
[im 34/76  soft-tissue]
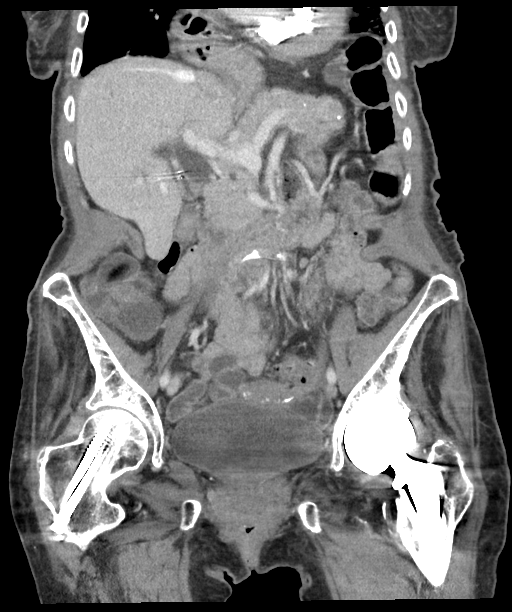
[im 42/76  soft-tissue]
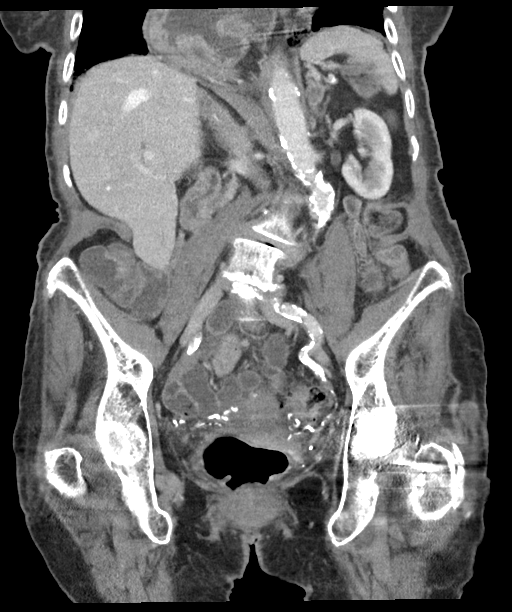

[15 of 46 positions shown; findings below may reference images not displayed]

FINDINGS: Lower chest: Reticular and patchy airspace lung opacities at the
dependent lung bases consistent with atelectasis and/or scarring.
Large hiatal hernia, new since the prior CT.

Hepatobiliary: Liver normal in size. Subcentimeter low-attenuation
lesions in the left lobe consistent with cysts. No other masses.
There is intra and extrahepatic bile duct dilation. Common bile duct
has a maximum diameter of 1.3 cm proximally, demonstrating normal
distal tapering. Previous cholecystectomy. Duct dilation is similar
to the prior CT.

Pancreas: Pancreatic divisum.  No mass or inflammation.

Spleen: Normal in size without focal abnormality.

Adrenals/Urinary Tract: Thickened adrenal glands consistent with
hyperplasia.

Kidneys normal in overall size, orientation and position. Small
low-density lesion, lateral midpole the right kidney, consistent
with a cyst. No other masses. Small bilateral intrarenal stones. No
hydronephrosis. Ureters not well visualized in their entirety. No
ureteral dilation or convincing stone. Bladder is unremarkable.

Stomach/Bowel: Stomach unremarkable other than the large hiatal
hernia. Small bowel and colon are normal in caliber. No wall
thickening. No adjacent inflammation. Scattered colonic air-fluid
levels. Air-fluid level in the rectum. There are numerous sigmoid
colon diverticula without evidence of diverticulitis.

Vascular/Lymphatic: Dense aortic and branch vessel atherosclerotic
calcifications. No aneurysm. No enlarged lymph nodes.

Reproductive: Uterus and bilateral adnexa are unremarkable.

Other: No abdominal wall hernia or abnormality. No abdominopelvic
ascites.

Musculoskeletal: No acute fracture. No bone lesion. There are
significant degenerative changes of the lumbar spine including a
prominent levoscoliosis. Patient has a left hip hemiarthroplasty and
2 screws reducing an old right femoral neck fracture. Orthopedic
hardware appears well seated and positioned.
IMPRESSION: 1. Air-fluid levels within the colon consistent with the given
history of diarrhea. No bowel wall thickening or inflammation. No
other evidence of an acute abnormality within the abdomen or pelvis.
2. Large hiatal hernia new since the prior exam.
3. Chronic intra and extrahepatic bile duct dilation.
4. Extensive aortic atherosclerosis extending to branch vessels.
5. Sigmoid colon diverticula.  No diverticulitis.
